# Patient Record
Sex: Female | Born: 1990
Health system: Southern US, Community
[De-identification: ages and names within clinical notes are randomized; demographics above are authoritative.]

## PROBLEM LIST (undated history)

## (undated) ENCOUNTER — Inpatient Hospital Stay (HOSPITAL_COMMUNITY): Payer: Self-pay

## (undated) DIAGNOSIS — D649 Anemia, unspecified: Secondary | ICD-10-CM

## (undated) DIAGNOSIS — O10911 Unspecified pre-existing hypertension complicating pregnancy, first trimester: Secondary | ICD-10-CM

## (undated) DIAGNOSIS — S0590XA Unspecified injury of unspecified eye and orbit, initial encounter: Secondary | ICD-10-CM

## (undated) DIAGNOSIS — R51 Headache: Secondary | ICD-10-CM

## (undated) DIAGNOSIS — E669 Obesity, unspecified: Secondary | ICD-10-CM

## (undated) HISTORY — DX: Anemia, unspecified: D64.9

## (undated) HISTORY — PX: APPENDECTOMY: SHX54

## (undated) HISTORY — PX: OTHER SURGICAL HISTORY: SHX169

---
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
## 2012-02-14 ENCOUNTER — Encounter (HOSPITAL_COMMUNITY): Payer: Self-pay | Admitting: Emergency Medicine

## 2012-02-14 ENCOUNTER — Emergency Department (HOSPITAL_COMMUNITY)
Admission: EM | Admit: 2012-02-14 | Discharge: 2012-02-14 | Disposition: A | Discharge status: Home / Self Care | Payer: Self-pay | Attending: Emergency Medicine | Admitting: Emergency Medicine

## 2012-02-14 DIAGNOSIS — R21 Rash and other nonspecific skin eruption: Secondary | ICD-10-CM | POA: Insufficient documentation

## 2012-02-14 DIAGNOSIS — B360 Pityriasis versicolor: Secondary | ICD-10-CM

## 2012-02-14 MED ORDER — SELENIUM SULFIDE 2.5 % EX LOTN: TOPICAL_LOTION | Freq: Every day | CUTANEOUS | Status: DC | PRN

## 2012-02-14 NOTE — ED Notes (Signed)
PT. REPORTS GENERALIZED ITCHY RASHES FOR SEVERAL WEEKS " IT BURNS " , RESPIRATIONS UNLABORED.

## 2012-02-14 NOTE — ED Provider Notes (Signed)
History     CSN: [id number]  Arrival date & time 02/14/12  7846   First MD Initiated Contact with Patient 02/14/12 2219      Chief Complaint  Patient presents with  . Rash    (Consider location/radiation/quality/duration/timing/severity/associated sxs/prior treatment) Patient is a 21 y.o. female presenting with rash. The history is provided by the patient. No language interpreter was used.  Rash  This is a new problem. The current episode started more than 1 week ago. The problem has not changed since onset.The problem is associated with nothing. There has been no fever. The rash is present on the torso, right upper leg, right lower leg, left upper leg and left lower leg. The pain is at a severity of 0/10. The patient is experiencing no pain. Pertinent negatives include no blisters, no itching, no pain and no weeping. She has tried nothing for the symptoms.  Rash to whole body sparing palms of hands, soles of feet and face. Itchng intermittantly. History reviewed. No pertinent past medical history.  History reviewed. No pertinent past surgical history.  No family history on file.  History  Substance Use Topics  . Smoking status: Never Smoker   . Smokeless tobacco: Not on file  . Alcohol Use: Yes    OB History    Grav Para Term Preterm Abortions TAB SAB Ect Mult Living                  Review of Systems  Constitutional: Negative.  Negative for fever.  HENT: Negative.   Eyes: Negative.   Respiratory: Negative.   Cardiovascular: Negative.   Gastrointestinal: Negative.   Skin: Positive for rash. Negative for itching.  Neurological: Negative.   Psychiatric/Behavioral: Negative.   All other systems reviewed and are negative.    Allergies  Review of patient's allergies indicates no known allergies.  Home Medications   Current Outpatient Rx  Name Route Sig Dispense Refill  . CALCIUM PO Oral Take 1 tablet by mouth 4 (four) times daily.    Marland Kitchen FERROUS SULFATE 325 (65  FE) MG PO TBEC Oral Take 325 mg by mouth daily with breakfast.    . NORETHINDRONE 0.35 MG PO TABS Oral Take 1 tablet by mouth daily.      BP 113/63  Pulse 68  Temp(Src) 98.6 F (37 C) (Oral)  SpO2 99%  LMP 01/29/2012  Physical Exam  Nursing note and vitals reviewed. Constitutional: She is oriented to person, place, and time. She appears well-developed and well-nourished.  HENT:  Head: Normocephalic and atraumatic.  Eyes: Conjunctivae and EOM are normal. Pupils are equal, round, and reactive to light.  Neck: Normal range of motion. Neck supple.  Cardiovascular: Normal rate.   Pulmonary/Chest: Effort normal.  Abdominal: Soft.  Musculoskeletal: Normal range of motion. She exhibits no edema and no tenderness.  Neurological: She is alert and oriented to person, place, and time. She has normal reflexes.  Skin: Skin is warm and dry.       Hypo pigmented macular rash/white all over body sparing face palms of hands and soles of feet.    Psychiatric: She has a normal mood and affect.    ED Course  Procedures (including critical care time)  Labs Reviewed - No data to display No results found.   No diagnosis found.    MDM  Hypopigmented macular rash suspect tinea versicular. Will  treat with selenium sulfide 2.5% x 1 month with dermatology follow up in the next month.  Remi Haggard, NP 02/15/12 [phone]

## 2012-02-14 NOTE — Discharge Instructions (Signed)
Kristine Perez have a fungal infection on your body. He can try this lotion for up to 2 months to see if it helps. You may need to take some and oral medication for up to 2 months to get rid of this. Followup with Dr. Margo Aye the dermatologist after 2 weeks. Return to the ER for high fever nausea and vomiting.  Tinea Versicolor Tinea versicolor is a common yeast infection of the skin. This condition becomes known when the yeast on our skin starts to overgrow (yeast is a normal inhabitant on our skin). This condition is noticed as white or light brown patches on brown skin, and is more evident in the summer on tanned skin. These areas are slightly scaly if scratched. The light patches from the yeast become evident when the yeast creates "holes in your suntan". This is most often noticed in the summer. The patches are usually located on the chest, back, pubis, neck and body folds. However, it may occur on any area of body. Mild itching and inflammation (redness or soreness) may be present. DIAGNOSIS  The diagnosisof this is made clinically (by looking). Cultures from samples are usually not needed. Examination under the microscope may help. However, yeast is normally found on skin. The diagnosis still remains clinical. Examination under Wood's Ultraviolet Light can determine the extent of the infection. TREATMENT  This common infection is usually only of cosmetic (only a concern to your appearance). It is easily treated with dandruff shampoo used during showers or bathing. Vigorous scrubbing will eliminate the yeast over several days time. The light areas in your skin may remain for weeks or months after the infection is cured unless your skin is exposed to sunlight. The lighter or darker spots caused by the fungus that remain after complete treatment are not a sign of treatment failure; it will take a long time to resolve. Your caregiver may recommend a number of commercial preparations or medication by mouth if home  care is not working. Recurrence is common and preventative medication may be necessary. This skin condition is not highly contagious. Special care is not needed to protect close friends and family members. Normal hygiene is usually enough. Follow up is required only if you develop complications (such as a secondary infection from scratching), if recommended by your caregiver, or if no relief is obtained from the preparations used. Document Released: 09/17/2000 Document Revised: 09/09/2011 Document Reviewed: 10/30/2008 [facility] Patient Information 2012 [place], [place].Tinea Versicolor Tinea versicolor is a common yeast infection of the skin. This condition becomes known when the yeast on our skin starts to overgrow (yeast is a normal inhabitant on our skin). This condition is noticed as white or light brown patches on brown skin, and is more evident in the summer on tanned skin. These areas are slightly scaly if scratched. The light patches from the yeast become evident when the yeast creates "holes in your suntan". This is most often noticed in the summer. The patches are usually located on the chest, back, pubis, neck and body folds. However, it may occur on any area of body. Mild itching and inflammation (redness or soreness) may be present. DIAGNOSIS  The diagnosisof this is made clinically (by looking). Cultures from samples are usually not needed. Examination under the microscope may help. However, yeast is normally found on skin. The diagnosis still remains clinical. Examination under Wood's Ultraviolet Light can determine the extent of the infection. TREATMENT  This common infection is usually only of cosmetic (only a concern to  your appearance). It is easily treated with dandruff shampoo used during showers or bathing. Vigorous scrubbing will eliminate the yeast over several days time. The light areas in your skin may remain for weeks or months after the infection is cured unless your skin is exposed  to sunlight. The lighter or darker spots caused by the fungus that remain after complete treatment are not a sign of treatment failure; it will take a long time to resolve. Your caregiver may recommend a number of commercial preparations or medication by mouth if home care is not working. Recurrence is common and preventative medication may be necessary. This skin condition is not highly contagious. Special care is not needed to protect close friends and family members. Normal hygiene is usually enough. Follow up is required only if you develop complications (such as a secondary infection from scratching), if recommended by your caregiver, or if no relief is obtained from the preparations used. Document Released: 09/17/2000 Document Revised: 09/09/2011 Document Reviewed: 10/30/2008 [facility] Patient Information 2012 [place], [place].Fungus Infection of the Skin An infection of your skin caused by a fungus is a very common problem. Treatment depends on which part of the body is affected. Types of fungal skin infection include:  Athlete's Foot(Tinea pedis). This infection starts between the toes and may involve the entire sole and sides of foot. It is the most common fungal disease. It is made worse by heat, moisture, and friction. To treat, wash your feet 2 to 3 times daily. Dry thoroughly between the toes. Use medicated foot powder or cream as directed on the package. Plain talc, cornstarch, or rice powder may be dusted into socks and shoes to keep the feet dry. Wearing footwear that allows ventilation is also helpful.   Ringworm (Tinea corporis and tinea capitis). This infection causes scaly red rings to form on the skin or scalp. For skin sores, apply medicated lotion or cream as directed on the package. For the scalp, medicated shampoo may be used with with other therapies. Ringworm of the scalp or fingernails usually requires using oral medicine for 2 to 4 months.   Tinea versicolor. This infection  appears as painless, scaly, patchy areas of discolored skin (whitish to light brown). It is more common in the summer and favors oily areas of the skin such as those found at the chest, abdomen, back, pubis, neck, and body folds. It can be treated with medicated shampoo or with medicated topical cream. Oral antifungals may be needed for more active infections. The light and/or dark spots may take time to get better and is not a sign of treatment failure.  Fungal infections may need to be treated for several weeks to be cured. It is important not to treat fungal infections with steroids or combination medicine that contains an antifungal and steroid as these will make the fungal infection worse. SEEK MEDICAL CARE IF:   You have persistent itching or rawness.   You have an oral temperature above 102 F (38.9 C).  Document Released: 10/28/2004 Document Revised: 09/09/2011 Document Reviewed: 01/13/2010 [facility] Patient Information 2012 [place], [place].

## 2012-02-15 NOTE — ED Provider Notes (Signed)
Medical screening examination/treatment/procedure(s) were performed by non-physician practitioner and as supervising physician I was immediately available for consultation/collaboration.  Flint Melter, MD 02/15/12 [phone]

## 2012-07-26 ENCOUNTER — Encounter (HOSPITAL_COMMUNITY): Payer: Self-pay | Admitting: Emergency Medicine

## 2012-07-26 ENCOUNTER — Emergency Department (INDEPENDENT_AMBULATORY_CARE_PROVIDER_SITE_OTHER)
Admission: EM | Admit: 2012-07-26 | Discharge: 2012-07-26 | Disposition: A | Discharge status: Home / Self Care | Payer: Self-pay | Source: Home / Self Care | Attending: Emergency Medicine | Admitting: Emergency Medicine

## 2012-07-26 DIAGNOSIS — J040 Acute laryngitis: Secondary | ICD-10-CM

## 2012-07-26 MED ORDER — NAPROXEN 500 MG PO TABS: 500.0000 mg | ORAL_TABLET | Freq: Two times a day (BID) | ORAL | Status: DC

## 2012-07-26 MED ORDER — PREDNISONE 5 MG PO KIT: 1.0000 | PACK | Freq: Every day | ORAL | Status: DC

## 2012-07-26 NOTE — ED Provider Notes (Signed)
Chief Complaint  Patient presents with  . Sore Throat    History of Present Illness:   The patient is a 21 year old female who's had a five-day history of sore throat, hoarseness, temperature of up to 101, headache, earache, soreness in the neck, a mild dry cough, and some nausea. She denies any vomiting, chest pain, wheezing, nasal congestion, or rhinorrhea. No exposure to strep or mono.  Review of Systems:  Other than as noted above, the patient denies any of the following symptoms. Systemic:  No fever, chills, sweats, fatigue, myalgias, headache, or anorexia. Eye:  No redness, pain or drainage. ENT:  No earache, ear congestion, nasal congestion, sneezing, rhinorrhea, sinus pressure, sinus pain, or post nasal drip. Lungs:  No cough, sputum production, wheezing, shortness of breath, or chest pain. GI:  No abdominal pain, nausea, vomiting, or diarrhea. Skin:  No rash or itching.  PMFSH:  Past medical history, family history, social history, meds, allergies, and nurse's notes were reviewed.  There is no known exposure to strep or mono.  No prior history of step or mono.  The patient denies use of tobacco.  Physical Exam:   Vital signs:  BP 125/78  Pulse 68  Temp 99.2 F (37.3 C) (Oral)  Resp 18  SpO2 100%  LMP 06/26/2012 General:  Alert, in no distress. Eye:  No conjunctival injection or drainage. Lids were normal. ENT:  TMs and canals were normal, without erythema or inflammation.  Nasal mucosa was clear and uncongested, without drainage.  Mucous membranes were moist.  Exam of pharynx was completely normal, no erythema, swelling, exudate, or ulcerations.  There were no oral ulcerations or lesions. Neck:  Supple, no adenopathy, tenderness or mass. Lungs:  No respiratory distress.  Lungs were clear to auscultation, without wheezes, rales or rhonchi.  Breath sounds were clear and equal bilaterally.  Heart:  Regular rhythm, without gallops, murmers or rubs. Skin:  Clear, warm, and dry,  without rash or lesions.  Labs:   Results for orders placed during the hospital encounter of 07/26/12  POCT RAPID STREP A ([facility]RG CARE ONLY)      Component Value Range   Streptococcus, Group A Screen (Direct) NEGATIVE  NEGATIVE  POCT PREGNANCY, URINE      Component Value Range   Preg Test, Ur NEGATIVE  NEGATIVE    Assessment:  The encounter diagnosis was Laryngitis.  Plan:   1.  The following meds were prescribed:   New Prescriptions   NAPROXEN (NAPROSYN) 500 MG TABLET    Take 1 tablet (500 mg total) by mouth 2 (two) times daily.   PREDNISONE 5 MG KIT    Take 1 kit (5 mg total) by mouth daily after breakfast. Prednisone 5 mg 6 day dosepack.  Take as directed.   2.  The patient was instructed in symptomatic care including hot saline gargles, throat lozenges, infectious precautions, and need to trade out toothbrush. Handouts were given. 3.  The patient was told to return if becoming worse in any way, if no better in 3 or 4 days, and given some red flag symptoms that would indicate earlier return.    Reuben Likes, MD 07/26/12 [phone]

## 2012-07-26 NOTE — ED Notes (Signed)
Last Friday had onset of symptoms: sore throat, ear pain and head pain.  Reports temp of 101 over the past 2 days

## 2012-07-30 ENCOUNTER — Encounter (HOSPITAL_COMMUNITY): Payer: Self-pay | Admitting: Emergency Medicine

## 2012-07-30 ENCOUNTER — Emergency Department (INDEPENDENT_AMBULATORY_CARE_PROVIDER_SITE_OTHER)
Admission: EM | Admit: 2012-07-30 | Discharge: 2012-07-30 | Disposition: A | Discharge status: Home / Self Care | Payer: Self-pay | Source: Home / Self Care | Attending: Emergency Medicine | Admitting: Emergency Medicine

## 2012-07-30 ENCOUNTER — Telehealth (HOSPITAL_COMMUNITY): Payer: Self-pay | Admitting: Emergency Medicine

## 2012-07-30 DIAGNOSIS — J040 Acute laryngitis: Secondary | ICD-10-CM

## 2012-07-30 NOTE — ED Provider Notes (Signed)
History     CSN: [id number]  Arrival date & time 07/30/12  1451   First MD Initiated Contact with Patient 07/30/12 1513      Chief Complaint  Patient presents with  . Hoarse    pt states that she is still not feeling any better. pt also says that she has not had her prescription filled from first initial visit.    (Consider location/radiation/quality/duration/timing/severity/associated sxs/prior treatment) HPI Comments: Patient presents urgent care after having been told at her job she needed to come in to be rechecked as she continues to be hoarsed in her voice "goes out". Denies any fever he for cough, shortness of breath or difficulty swallowing.  " I was told to come here to be rechecked as what they told me to do my at my job" " no I did not get to medicines were prescribed to me on October 23"  The history is provided by the patient.    History reviewed. No pertinent past medical history.  History reviewed. No pertinent past surgical history.  No family history on file.  History  Substance Use Topics  . Smoking status: Never Smoker   . Smokeless tobacco: Not on file  . Alcohol Use: Yes    OB History    Grav Para Term Preterm Abortions TAB SAB Ect Mult Living                  Review of Systems  Constitutional: Negative for fever and activity change.  HENT: Positive for congestion and voice change. Negative for ear pain, trouble swallowing and tinnitus.   Eyes: Negative for pain, discharge and itching.  Gastrointestinal: Negative for nausea.  Skin: Negative for rash.  Neurological: Negative for dizziness and headaches.    Allergies  Review of patient's allergies indicates no known allergies.  Home Medications   Current Outpatient Rx  Name Route Sig Dispense Refill  . CALCIUM PO Oral Take 1 tablet by mouth 4 (four) times daily.    Marland Kitchen FERROUS SULFATE 325 (65 FE) MG PO TBEC Oral Take 325 mg by mouth daily with breakfast.    . NAPROXEN 500 MG PO TABS Oral  Take 1 tablet (500 mg total) by mouth 2 (two) times daily. 30 tablet 0  . NORETHINDRONE 0.35 MG PO TABS Oral Take 1 tablet by mouth daily.    Marland Kitchen PREDNISONE 5 MG PO KIT Oral Take 1 kit (5 mg total) by mouth daily after breakfast. Prednisone 5 mg 6 day dosepack.  Take as directed. 1 kit 0  . NYQUIL PO Oral Take by mouth.    . DAYQUIL PO Oral Take by mouth.    . SELENIUM SULFIDE 2.5 % EX LOTN Topical Apply topically daily as needed for itching. 118 mL 12    BP 123/70  Pulse 70  Temp 98.1 F (36.7 C) (Oral)  Resp 18  SpO2 100%  LMP 07/26/2012  Physical Exam  Nursing note and vitals reviewed. Constitutional: Vital signs are normal. She appears well-developed and well-nourished.  Non-toxic appearance. She does not have a sickly appearance. She does not appear ill. No distress.  HENT:  Right Ear: Tympanic membrane normal.  Left Ear: Tympanic membrane normal.  Mouth/Throat: Uvula is midline and oropharynx is clear and moist. No oropharyngeal exudate, posterior oropharyngeal edema, posterior oropharyngeal erythema or tonsillar abscesses.  Eyes: Conjunctivae normal are normal.  Neck: Trachea normal. Neck supple. No JVD present. No mass and no thyromegaly present.  Cardiovascular: Normal rate.   No  murmur heard. Pulmonary/Chest: Effort normal and breath sounds normal.  Lymphadenopathy:    She has no cervical adenopathy.  Neurological: She is alert.  Skin: Skin is warm. No erythema.    ED Course  Procedures (including critical care time)  Labs Reviewed - No data to display No results found.   1. Laryngitis       MDM  Laryngitis. Patient nonadherent compliant with treatment as in previous urgent care visit. Have encouraged her to take the prednisone cycle along with an antihistamine over-the-counter such as Zyrtec or Allegra. She acknowledges and decided that this time she will do so. Patient is afebrile with a normal pharyngeal exam.      Jimmie Molly, MD 07/30/12 [phone]

## 2012-07-30 NOTE — ED Notes (Signed)
Patient came in today and spoke with registration staff.  She told them she was unable to speak and was requesting a work note extension.  According to Dr Melanee Spry discharge instructions patient needed to be re-evaluated if she was not better in 3 to 4 days.  Patient made aware

## 2012-07-30 NOTE — ED Notes (Signed)
Pt states no improvement of symptoms.  Pt has not started meds as prescribed.  Was seen on  10/23 loss of  Voice  Needs note for work.

## 2013-01-15 ENCOUNTER — Inpatient Hospital Stay (HOSPITAL_COMMUNITY): Payer: Medicaid Other

## 2013-01-15 ENCOUNTER — Encounter (HOSPITAL_COMMUNITY): Payer: Self-pay | Admitting: *Deleted

## 2013-01-15 ENCOUNTER — Inpatient Hospital Stay (HOSPITAL_COMMUNITY)
Admission: AD | Admit: 2013-01-15 | Discharge: 2013-01-15 | Disposition: A | Discharge status: Home / Self Care | Payer: Medicaid Other | Source: Ambulatory Visit | Attending: Obstetrics & Gynecology | Admitting: Obstetrics & Gynecology

## 2013-01-15 DIAGNOSIS — A499 Bacterial infection, unspecified: Secondary | ICD-10-CM

## 2013-01-15 DIAGNOSIS — B9689 Other specified bacterial agents as the cause of diseases classified elsewhere: Secondary | ICD-10-CM | POA: Insufficient documentation

## 2013-01-15 DIAGNOSIS — O2390 Unspecified genitourinary tract infection in pregnancy, unspecified trimester: Secondary | ICD-10-CM | POA: Insufficient documentation

## 2013-01-15 DIAGNOSIS — O26899 Other specified pregnancy related conditions, unspecified trimester: Secondary | ICD-10-CM

## 2013-01-15 DIAGNOSIS — R109 Unspecified abdominal pain: Secondary | ICD-10-CM | POA: Insufficient documentation

## 2013-01-15 DIAGNOSIS — N760 Acute vaginitis: Secondary | ICD-10-CM | POA: Insufficient documentation

## 2013-01-15 HISTORY — DX: Headache: R51

## 2013-01-15 LAB — CBC
HCT: 31.7 % — ABNORMAL LOW (ref 36.0–46.0)
Hemoglobin: 10.8 g/dL — ABNORMAL LOW (ref 12.0–15.0)
MCH: 26.9 pg (ref 26.0–34.0)
MCV: 78.9 fL (ref 78.0–100.0)
Platelets: 299 10*3/uL (ref 150–400)

## 2013-01-15 LAB — WET PREP, GENITAL

## 2013-01-15 LAB — OB RESULTS CONSOLE GC/CHLAMYDIA: Gonorrhea: NEGATIVE

## 2013-01-15 LAB — URINALYSIS, ROUTINE W REFLEX MICROSCOPIC
Bilirubin Urine: NEGATIVE
Leukocytes, UA: NEGATIVE
Nitrite: NEGATIVE
Specific Gravity, Urine: 1.01 (ref 1.005–1.030)
pH: 6.5 (ref 5.0–8.0)

## 2013-01-15 LAB — POCT PREGNANCY, URINE: Preg Test, Ur: POSITIVE — AB

## 2013-01-15 MED ORDER — METRONIDAZOLE 500 MG PO TABS: 500.0000 mg | ORAL_TABLET | Freq: Two times a day (BID) | ORAL | Status: DC

## 2013-01-15 NOTE — MAU Note (Signed)
Name and DOB verified, pt confirms spelling is correct on armband 

## 2013-01-15 NOTE — MAU Note (Addendum)
Been having abd pain. Missed period this month.  Did a whole bunch of tests, they were positive, but she still can't believe it.  Has been nauseated, stomach gets in knots when eats.

## 2013-01-15 NOTE — MAU Provider Note (Signed)
IUGS on [place].  Attestation of Attending Supervision of Advanced Practitioner (CNM/NP): Evaluation and management procedures were performed by the Advanced Practitioner under my supervision and collaboration. I have reviewed the Advanced Practitioner's note and chart, and I agree with the management and plan.  LEGGETT,KELLY H. 5:13 PM

## 2013-01-15 NOTE — MAU Provider Note (Signed)
History     CSN: [id number]  Arrival date and time: 01/15/13 1328   None     Chief Complaint  Patient presents with  . Abdominal Pain  . Possible Pregnancy   HPI 22 y.o. G1P0 at [redacted]w[redacted]d with low abd cramping for a few weeks. No bleeding or discharge. Plans on prenatal care with [facility].   Past Medical History  Diagnosis Date  . Headache     migraines    Past Surgical History  Procedure Laterality Date  . Abscess      under arm    Family History  Problem Relation Age of Onset  . Hypertension Mother   . Diabetes Maternal Grandmother   . Heart disease Maternal Grandmother   . Diabetes Maternal Grandfather   . Cancer Maternal Grandfather     History  Substance Use Topics  . Smoking status: Never Smoker   . Smokeless tobacco: Not on file  . Alcohol Use: Yes     Comment: weekends until + UPT    Allergies: No Known Allergies  Prescriptions prior to admission  Medication Sig Dispense Refill  . [DISCONTINUED] CALCIUM PO Take 1 tablet by mouth 4 (four) times daily.      . [DISCONTINUED] ferrous sulfate 325 (65 FE) MG EC tablet Take 325 mg by mouth daily with breakfast.      . [DISCONTINUED] naproxen (NAPROSYN) 500 MG tablet Take 1 tablet (500 mg total) by mouth 2 (two) times daily.  30 tablet  0  . [DISCONTINUED] norethindrone (MICRONOR,CAMILA,ERRIN) 0.35 MG tablet Take 1 tablet by mouth daily.      . [DISCONTINUED] PredniSONE 5 MG KIT Take 1 kit (5 mg total) by mouth daily after breakfast. Prednisone 5 mg 6 day dosepack.  Take as directed.  1 kit  0  . [DISCONTINUED] Pseudoeph-Doxylamine-DM-APAP (NYQUIL PO) Take by mouth.      . [DISCONTINUED] Pseudoephedrine-APAP-DM (DAYQUIL PO) Take by mouth.      . [DISCONTINUED] selenium sulfide (SELSUN) 2.5 % shampoo Apply topically daily as needed for itching.  118 mL  12    Review of Systems  Constitutional: Negative.   Respiratory: Negative.   Cardiovascular: Negative.   Gastrointestinal: Positive for abdominal pain.  Negative for nausea, vomiting, diarrhea and constipation.  Genitourinary: Negative for dysuria, urgency, frequency, hematuria and flank pain.       Negative for vaginal bleeding, vaginal discharge, dyspareunia  Musculoskeletal: Negative.   Neurological: Negative.   Psychiatric/Behavioral: Negative.    Physical Exam   Blood pressure 129/69, pulse 98, temperature 98.1 F (36.7 C), temperature source Oral, resp. rate 18, height 5\' 6"  (1.676 m), weight 213 lb (96.616 kg), last menstrual period 12/13/2012.  Physical Exam  Nursing note and vitals reviewed. Constitutional: She is oriented to person, place, and time. She appears well-developed and well-nourished. No distress.  Cardiovascular: Normal rate.   Respiratory: Effort normal.  GI: Soft. There is no tenderness.  Genitourinary: There is no tenderness, lesion or injury on the right labia. There is no tenderness, lesion or injury on the left labia. Uterus is not enlarged and not tender. Cervix exhibits no motion tenderness and no friability. Right adnexum displays no mass, no tenderness and no fullness. Left adnexum displays no mass, no tenderness and no fullness. No bleeding around the vagina. Vaginal discharge (white, malodorous) found.  Musculoskeletal: Normal range of motion.  Neurological: She is alert and oriented to person, place, and time.  Skin: Skin is warm and dry.  Psychiatric: She has a  normal mood and affect.    MAU Course  Procedures Results for orders placed during the hospital encounter of 01/15/13 (from the past 24 hour(s))  URINALYSIS, ROUTINE W REFLEX MICROSCOPIC     Status: None   Collection Time    01/15/13  1:55 PM      Result Value Range   Color, Urine YELLOW  YELLOW   APPearance CLEAR  CLEAR   Specific Gravity, Urine 1.010  1.005 - 1.030   pH 6.5  5.0 - 8.0   Glucose, UA NEGATIVE  NEGATIVE mg/dL   Hgb urine dipstick NEGATIVE  NEGATIVE   Bilirubin Urine NEGATIVE  NEGATIVE   Ketones, ur NEGATIVE  NEGATIVE  mg/dL   Protein, ur NEGATIVE  NEGATIVE mg/dL   Urobilinogen, UA 0.2  0.0 - 1.0 mg/dL   Nitrite NEGATIVE  NEGATIVE   Leukocytes, UA NEGATIVE  NEGATIVE  POCT PREGNANCY, URINE     Status: Abnormal   Collection Time    01/15/13  1:58 PM      Result Value Range   Preg Test, Ur POSITIVE (*) NEGATIVE  HCG, QUANTITATIVE, PREGNANCY     Status: Abnormal   Collection Time    01/15/13  2:07 PM      Result Value Range   hCG, Beta Chain, Quant, S 4466 (*) <5 mIU/mL  CBC     Status: Abnormal   Collection Time    01/15/13  2:08 PM      Result Value Range   WBC 8.4  4.0 - 10.5 K/uL   RBC 4.02  3.87 - 5.11 MIL/uL   Hemoglobin 10.8 (*) 12.0 - 15.0 g/dL   HCT 40.9 (*) 81.1 - 91.4 %   MCV 78.9  78.0 - 100.0 fL   MCH 26.9  26.0 - 34.0 pg   MCHC 34.1  30.0 - 36.0 g/dL   RDW 78.2  95.6 - 21.3 %   Platelets 299  150 - 400 K/uL  WET PREP, GENITAL     Status: Abnormal   Collection Time    01/15/13  3:10 PM      Result Value Range   Yeast Wet Prep HPF POC NONE SEEN  NONE SEEN   Trich, Wet Prep NONE SEEN  NONE SEEN   Clue Cells Wet Prep HPF POC FEW (*) NONE SEEN   WBC, Wet Prep HPF POC MODERATE (*) NONE SEEN   U/S: 5 week size IUGS, no yolk sac, fetal pole or adnexal mass seen  Assessment and Plan   1. BV (bacterial vaginosis)   2. Abdominal pain complicating pregnancy   Rx flagyl, precautions rev'd, return to MAU in 48 hours for repeat quant    Medication List    STOP taking these medications       CALCIUM PO     DAYQUIL PO     ferrous sulfate 325 (65 FE) MG EC tablet     naproxen 500 MG tablet  Commonly known as:  NAPROSYN     norethindrone 0.35 MG tablet  Commonly known as:  MICRONOR,CAMILA,ERRIN     NYQUIL PO     PredniSONE 5 MG Kit     selenium sulfide 2.5 % shampoo  Commonly known as:  SELSUN      TAKE these medications       metroNIDAZOLE 500 MG tablet  Commonly known as:  FLAGYL  Take 1 tablet (500 mg total) by mouth 2 (two) times daily.  Follow-up Information   Follow up with THE [facility] [place] MATERNITY ADMISSIONS On 01/18/2013. (for labs)    Contact information:   [address] [id number] [place] [address] [phone]        [facility] 01/15/2013, 3:39 PM

## 2013-01-16 LAB — GC/CHLAMYDIA PROBE AMP: GC Probe RNA: NEGATIVE

## 2013-01-17 ENCOUNTER — Inpatient Hospital Stay (HOSPITAL_COMMUNITY)
Admission: AD | Admit: 2013-01-17 | Discharge: 2013-01-17 | Disposition: A | Discharge status: Home / Self Care | Payer: Medicaid Other | Source: Ambulatory Visit | Attending: Obstetrics and Gynecology | Admitting: Obstetrics and Gynecology

## 2013-01-17 DIAGNOSIS — R109 Unspecified abdominal pain: Secondary | ICD-10-CM

## 2013-01-17 DIAGNOSIS — O26899 Other specified pregnancy related conditions, unspecified trimester: Secondary | ICD-10-CM

## 2013-01-17 DIAGNOSIS — O9989 Other specified diseases and conditions complicating pregnancy, childbirth and the puerperium: Secondary | ICD-10-CM

## 2013-01-17 DIAGNOSIS — O99891 Other specified diseases and conditions complicating pregnancy: Secondary | ICD-10-CM | POA: Insufficient documentation

## 2013-01-17 NOTE — MAU Note (Signed)
Patient to MAU for repeat BHCG. Patient denies any pain or bleeding.

## 2013-01-17 NOTE — MAU Provider Note (Signed)
Ms. Kristine Perez is a 22 y.o. G1P0 at [redacted]w[redacted]d who presents to MAU for a 48 repeat quant hCG. The patient denies bleeding, abdominal pain, N/V or fever today  BP 127/64  Pulse 72  Temp(Src) 98.3 F (36.8 C) (Oral)  Resp 16  SpO2 100%  LMP 12/13/2012  GENERAL: Well-developed, well-nourished female in no acute distress.  HEENT: Normocephalic, atraumatic.   LUNGS: Effort normal HEART: Regular rate  SKIN: Warm, dry and without erythema PSYCH: Normal mood and affect  Results for orders placed during the hospital encounter of 01/17/13 (from the past 24 hour(s))  HCG, QUANTITATIVE, PREGNANCY     Status: Abnormal   Collection Time    01/17/13 11:41 AM      Result Value Range   hCG, Beta Chain, Quant, S 7977 (*) <5 mIU/mL   MDM Discussed with Dr. Jolayne Panther. Give ectopic precautions and follow-up for [place] in one week from last [place].   A: Inappropriate rise in quant after 48 hours  P: Discharge home Ectopic precautions discussed [place] scheduled for 01/22/13 @ 11:30 am Patient may return to MAU as needed or sooner if her condition were to change or worsen  Freddi Starr, PA-C 01/17/2013 1:27 PM

## 2013-01-17 NOTE — MAU Provider Note (Signed)
Intrauterine gestational sac previously visualized on ultrasound, no adnexal mass  Attestation of Attending Supervision of Advanced Practitioner (CNM/NP): Evaluation and management procedures were performed by the Advanced Practitioner under my supervision and collaboration.  I have reviewed the Advanced Practitioner's note and chart, and I agree with the management and plan.  Travian Kerner 01/17/2013 4:15 PM

## 2013-01-22 ENCOUNTER — Encounter (HOSPITAL_COMMUNITY): Payer: Self-pay

## 2013-01-22 ENCOUNTER — Inpatient Hospital Stay (HOSPITAL_COMMUNITY)
Admission: AD | Admit: 2013-01-22 | Discharge: 2013-01-22 | Disposition: A | Discharge status: Home / Self Care | Payer: Self-pay | Source: Ambulatory Visit | Attending: Obstetrics and Gynecology | Admitting: Obstetrics and Gynecology

## 2013-01-22 ENCOUNTER — Ambulatory Visit (HOSPITAL_COMMUNITY)
Admission: RE | Admit: 2013-01-22 | Discharge: 2013-01-22 | Disposition: A | Discharge status: Home / Self Care | Payer: Medicaid Other | Source: Ambulatory Visit | Attending: Medical | Admitting: Medical

## 2013-01-22 DIAGNOSIS — O99891 Other specified diseases and conditions complicating pregnancy: Secondary | ICD-10-CM | POA: Insufficient documentation

## 2013-01-22 DIAGNOSIS — Z1389 Encounter for screening for other disorder: Secondary | ICD-10-CM

## 2013-01-22 DIAGNOSIS — Z3490 Encounter for supervision of normal pregnancy, unspecified, unspecified trimester: Secondary | ICD-10-CM

## 2013-01-22 NOTE — MAU Provider Note (Signed)
Attestation of Attending Supervision of Advanced Practitioner (CNM/NP): Evaluation and management procedures were performed by the Advanced Practitioner under my supervision and collaboration.  I have reviewed the Advanced Practitioner's note and chart, and I agree with the management and plan.  Latayvia Mandujano 01/22/2013 7:31 PM   

## 2013-01-22 NOTE — MAU Note (Signed)
Pt here today for f/u u/s for viability. Denies pain or bleeding.

## 2013-01-22 NOTE — MAU Provider Note (Signed)
22 y.o. G1P0 at [redacted]w[redacted]d here for repeat u/s for viability. No pain or bleeding today. Unsure where she will get her prenatal care, but has applied for her medicaid.   BP 117/59  Pulse 78  Temp(Src) 97.9 F (36.6 C) (Oral)  Resp 16  Ht 5\' 6"  (1.676 m)  Wt 215 lb 4 oz (97.637 kg)  BMI 34.76 kg/m2  LMP 12/13/2012  Gen: well, no distress  U/S: 5.6 week IUP, + FHR, small subchorionic hemorrhage  A/P: 22 y.o. G1P0 at [redacted]w[redacted]d with living IUP Follow up for prenatal care as soon as possible, list of providers given, precautions rev'd

## 2013-01-27 ENCOUNTER — Inpatient Hospital Stay (HOSPITAL_COMMUNITY)
Admission: AD | Admit: 2013-01-27 | Discharge: 2013-01-27 | Disposition: A | Discharge status: Home / Self Care | Payer: Medicaid Other | Source: Ambulatory Visit | Attending: Obstetrics & Gynecology | Admitting: Obstetrics & Gynecology

## 2013-01-27 ENCOUNTER — Encounter (HOSPITAL_COMMUNITY): Payer: Self-pay

## 2013-01-27 DIAGNOSIS — O210 Mild hyperemesis gravidarum: Secondary | ICD-10-CM | POA: Insufficient documentation

## 2013-01-27 DIAGNOSIS — R1032 Left lower quadrant pain: Secondary | ICD-10-CM | POA: Insufficient documentation

## 2013-01-27 DIAGNOSIS — O219 Vomiting of pregnancy, unspecified: Secondary | ICD-10-CM

## 2013-01-27 DIAGNOSIS — R51 Headache: Secondary | ICD-10-CM | POA: Insufficient documentation

## 2013-01-27 LAB — URINALYSIS, ROUTINE W REFLEX MICROSCOPIC
Bilirubin Urine: NEGATIVE
Glucose, UA: NEGATIVE mg/dL
Hgb urine dipstick: NEGATIVE
Ketones, ur: NEGATIVE mg/dL
Leukocytes, UA: NEGATIVE
Nitrite: NEGATIVE
Protein, ur: NEGATIVE mg/dL
Specific Gravity, Urine: 1.025 (ref 1.005–1.030)
Urobilinogen, UA: 1 mg/dL (ref 0.0–1.0)
pH: 6.5 (ref 5.0–8.0)

## 2013-01-27 MED ORDER — PROMETHAZINE HCL 25 MG/ML IJ SOLN
25.0000 mg | Freq: Once | INTRAVENOUS | Status: AC
  Administered 2013-01-27: 25 mg via INTRAVENOUS
  Filled 2013-01-27: qty 1

## 2013-01-27 MED ORDER — ONDANSETRON HCL 8 MG PO TABS: 8.0000 mg | ORAL_TABLET | Freq: Three times a day (TID) | ORAL | Status: DC | PRN

## 2013-01-27 MED ORDER — ACETAMINOPHEN 325 MG PO TABS
650.0000 mg | ORAL_TABLET | Freq: Once | ORAL | Status: AC
  Administered 2013-01-27: 650 mg via ORAL
  Filled 2013-01-27: qty 2

## 2013-01-27 MED ORDER — PROMETHAZINE HCL 12.5 MG PO TABS: 12.5000 mg | ORAL_TABLET | Freq: Four times a day (QID) | ORAL | Status: DC | PRN

## 2013-01-27 NOTE — MAU Provider Note (Signed)
History     CSN: [id number]  Arrival date & time 01/27/13  1820   None     Chief Complaint  Patient presents with  . Emesis    (Consider location/radiation/quality/duration/timing/severity/associated sxs/prior treatment) HPI Kristine Perez is a 22 y.o. G1P0000 at [redacted]w[redacted]d. She presents with c/o vomiting x 2 d. She has had a headache that started yesterday, feels dizzy. She has sharp pain LLQ off/on, with movement.  She had U/S 4/21- 5 6/7 wk IUP,FHR 128, sm SCH.   Past Medical History  Diagnosis Date  . Headache     migraines    Past Surgical History  Procedure Laterality Date  . Abscess      under arm  . No past surgeries      Family History  Problem Relation Age of Onset  . Hypertension Mother   . Diabetes Maternal Grandmother   . Heart disease Maternal Grandmother   . Diabetes Maternal Grandfather   . Cancer Maternal Grandfather     History  Substance Use Topics  . Smoking status: Never Smoker   . Smokeless tobacco: Not on file  . Alcohol Use: No     Comment: weekends until + UPT    OB History   Grav Para Term Preterm Abortions TAB SAB Ect Mult Living   1 0 0 0 0 0 0 0 0 0       Review of Systems  Constitutional: Negative for fever and chills.  Gastrointestinal: Positive for vomiting. Negative for diarrhea and constipation.  Genitourinary: Positive for pelvic pain. Negative for dysuria, urgency, frequency, vaginal bleeding and vaginal discharge.  Neurological: Positive for dizziness and headaches.    Allergies  Review of patient's allergies indicates no known allergies.  Home Medications  No current outpatient prescriptions on file.  BP 134/59  Pulse 83  Resp 18  Ht 5\' 6"  (1.676 m)  Wt 213 lb 9.6 oz (96.888 kg)  BMI 34.49 kg/m2  LMP 12/13/2012  Physical Exam  Constitutional: She appears well-developed and well-nourished.  Abdominal: Soft. She exhibits no mass. There is no tenderness. There is no rebound and no guarding.  Skin: Skin is  warm and dry.  Psychiatric: She has a normal mood and affect. Her behavior is normal.    ED Course  Procedures (including critical care time)  Labs Reviewed  URINALYSIS, ROUTINE W REFLEX MICROSCOPIC   No results found. Care to [facility] @ 2100  No diagnosis found.    MDM

## 2013-01-27 NOTE — MAU Note (Signed)
Pt reports since yesterday she has not been able to keep food or fluids down. Feels real dzzy and c/o sharp pain in her LLQ.

## 2013-01-27 NOTE — Progress Notes (Signed)
Filed Vitals:   01/27/13 2034  BP:   Pulse:   Temp: 98.7 F (37.1 C)  Resp:     Report received from Eveline Keto; pt reassessed at 2225 after IV infused;  pt reports improvement in symptoms and desires discharge home.  Plans to begin prenatal care at [facility].  A:  Vomiting in Pregnancy  P: DC to home RX Zofran Keep prenatal care appt [facility]

## 2013-02-07 ENCOUNTER — Encounter (HOSPITAL_COMMUNITY): Payer: Self-pay | Admitting: *Deleted

## 2013-02-07 ENCOUNTER — Inpatient Hospital Stay (HOSPITAL_COMMUNITY)
Admission: AD | Admit: 2013-02-07 | Discharge: 2013-02-08 | Disposition: A | Discharge status: Home / Self Care | Payer: Medicaid Other | Source: Ambulatory Visit | Attending: Obstetrics & Gynecology | Admitting: Obstetrics & Gynecology

## 2013-02-07 DIAGNOSIS — O219 Vomiting of pregnancy, unspecified: Secondary | ICD-10-CM

## 2013-02-07 DIAGNOSIS — R109 Unspecified abdominal pain: Secondary | ICD-10-CM | POA: Insufficient documentation

## 2013-02-07 DIAGNOSIS — O210 Mild hyperemesis gravidarum: Secondary | ICD-10-CM

## 2013-02-07 LAB — URINALYSIS, ROUTINE W REFLEX MICROSCOPIC
Ketones, ur: >80 mg/dL — AB
Nitrite: NEGATIVE
Protein, ur: 30 mg/dL — AB

## 2013-02-07 LAB — URINE MICROSCOPIC-ADD ON

## 2013-02-07 MED ORDER — PROMETHAZINE HCL 25 MG/ML IJ SOLN
25.0000 mg | Freq: Once | INTRAVENOUS | Status: AC
  Administered 2013-02-07: 25 mg via INTRAVENOUS
  Filled 2013-02-07: qty 1

## 2013-02-07 MED ORDER — PROMETHAZINE HCL 25 MG PO TABS: 25.0000 mg | ORAL_TABLET | Freq: Four times a day (QID) | ORAL | Status: DC | PRN

## 2013-02-07 MED ORDER — M.V.I. ADULT IV INJ
Freq: Once | INTRAVENOUS | Status: AC
  Administered 2013-02-07: 21:00:00 via INTRAVENOUS
  Filled 2013-02-07: qty 1000

## 2013-02-07 NOTE — MAU Note (Signed)
Pt arrived by EMS for N/V x 7 days but pt is unable to keep anything down for over 24 hours now.  Pt unable to keep zofran, flagyl, food or water.  Denies vaginal bleeding.

## 2013-02-07 NOTE — MAU Provider Note (Addendum)
History     CSN: [id number]  Arrival date and time: 02/07/13 1717   First Provider Initiated Contact with Patient 02/07/13 1737      Chief Complaint  Patient presents with  . Emesis During Pregnancy   HPI Ms. Kristine Perez is a 22 y.o. G1P0000 at [redacted]w[redacted]d who presents to MAU today with complaint of N/V. The patient states that this has been consistent throughout her pregnancy, however much worse the last week. She states no intake x 24 hours. She has phenergan and zofran from a previous visit, but has not been able to keep them down. The patient is having mild upper abdominal pain. She denies fever or diarrhea. She plans to go to [facility] for prenatal care, but has not had a prenatal appointment with them yet.    OB History   Grav Para Term Preterm Abortions TAB SAB Ect Mult Living   1 0 0 0 0 0 0 0 0 0       Past Medical History  Diagnosis Date  . Headache     migraines    Past Surgical History  Procedure Laterality Date  . Abscess      under arm  . No past surgeries      Family History  Problem Relation Age of Onset  . Hypertension Mother   . Diabetes Maternal Grandmother   . Heart disease Maternal Grandmother   . Diabetes Maternal Grandfather   . Cancer Maternal Grandfather     History  Substance Use Topics  . Smoking status: Never Smoker   . Smokeless tobacco: Not on file  . Alcohol Use: No     Comment: weekends until + UPT    Allergies: No Known Allergies  Prescriptions prior to admission  Medication Sig Dispense Refill  . flintstones complete (FLINTSTONES) 60 MG chewable tablet Chew 2 tablets by mouth daily.      . metroNIDAZOLE (FLAGYL) 250 MG tablet Take 250 mg by mouth 3 (three) times daily.      . ondansetron (ZOFRAN) 8 MG tablet Take 1 tablet (8 mg total) by mouth every 8 (eight) hours as needed for nausea.  30 tablet  1  . promethazine (PHENERGAN) 12.5 MG tablet Take 1 tablet (12.5 mg total) by mouth every 6 (six) hours as needed for nausea.  30  tablet  0    Review of Systems  Constitutional: Positive for malaise/fatigue. Negative for fever.  Gastrointestinal: Positive for nausea, vomiting and abdominal pain. Negative for diarrhea and constipation.  Genitourinary: Negative for dysuria, urgency and frequency.       Neg - vaginal bleeding, discharge  Neurological: Positive for dizziness and weakness. Negative for loss of consciousness.   Physical Exam   Blood pressure 130/66, pulse 69, temperature 98.8 F (37.1 C), temperature source Oral, resp. rate 16, height 5\' 6"  (1.676 m), weight 213 lb (96.616 kg), last menstrual period 12/13/2012, SpO2 100.00%.  Physical Exam  Constitutional: She is oriented to person, place, and time. She appears well-developed and well-nourished. No distress.  HENT:  Head: Normocephalic and atraumatic.  Cardiovascular: Normal rate, regular rhythm and normal heart sounds.   Respiratory: Effort normal and breath sounds normal. No respiratory distress.  GI: Soft. Bowel sounds are normal. She exhibits no distension and no mass. There is no tenderness. There is no rebound and no guarding.  Neurological: She is alert and oriented to person, place, and time.  Skin: Skin is warm and dry. No erythema.  Psychiatric: She has a normal  mood and affect.   Results for orders placed during the hospital encounter of 02/07/13 (from the past 24 hour(s))  URINALYSIS, ROUTINE W REFLEX MICROSCOPIC     Status: Abnormal   Collection Time    02/07/13  5:45 PM      Result Value Range   Color, Urine YELLOW  YELLOW   APPearance HAZY (*) CLEAR   Specific Gravity, Urine >1.030 (*) 1.005 - 1.030   pH 6.5  5.0 - 8.0   Glucose, UA NEGATIVE  NEGATIVE mg/dL   Hgb urine dipstick NEGATIVE  NEGATIVE   Bilirubin Urine NEGATIVE  NEGATIVE   Ketones, ur >80 (*) NEGATIVE mg/dL   Protein, ur 30 (*) NEGATIVE mg/dL   Urobilinogen, UA 1.0  0.0 - 1.0 mg/dL   Nitrite NEGATIVE  NEGATIVE   Leukocytes, UA TRACE (*) NEGATIVE  URINE  MICROSCOPIC-ADD ON     Status: Abnormal   Collection Time    02/07/13  5:45 PM      Result Value Range   Squamous Epithelial / LPF FEW (*) RARE   WBC, UA 3-6  <3 WBC/hpf   Bacteria, UA FEW (*) RARE   Urine-Other MUCOUS PRESENT      MAU Course  Procedures None  MDM UA show signs of significant dehydration IV LR with phenergan infusion 1940 - Patient receiving IV fluids. Care turned over to Nolene Bernheim, NP  Assessment and Plan    Freddi Starr, PA-C  02/07/2013, 7:10 PM   Dorathy Kinsman, CNM assumed care of pt at 2000.  2330: Pt tolerating crackers and fluids.   Assessment: 1. Nausea and vomiting in pregnancy prior to [redacted] weeks gestation    Plan: D/C home in stable condition. Advance diet slowly.  Follow-up Information   Follow up with [facility] On 03/19/2013. (as scheduled or as needed if symptoms worsen)    Contact information:   [address] [place] [address] [phone]      Follow up with THE [facility] [place] MATERNITY ADMISSIONS. (As needed if symptoms worsen)    Contact information:   [address] [place] [address] [phone]       Medication List    TAKE these medications       flintstones complete 60 MG chewable tablet  Chew 2 tablets by mouth daily.     metroNIDAZOLE 250 MG tablet  Commonly known as:  FLAGYL  Take 250 mg by mouth 3 (three) times daily.     ondansetron 8 MG tablet  Commonly known as:  ZOFRAN  Take 1 tablet (8 mg total) by mouth every 8 (eight) hours as needed for nausea.     promethazine 25 MG tablet  Commonly known as:  PHENERGAN  Take 1 tablet (25 mg total) by mouth every 6 (six) hours as needed for nausea.       [place], CNM 02/08/2013 12:21 AM

## 2013-02-08 LAB — URINE CULTURE: Colony Count: >=100000

## 2013-02-08 NOTE — MAU Note (Signed)
DC instructions given to pt. Pt verbalized understanding.

## 2013-03-01 ENCOUNTER — Inpatient Hospital Stay (HOSPITAL_COMMUNITY)
Admission: AD | Admit: 2013-03-01 | Discharge: 2013-03-02 | Disposition: A | Discharge status: Home / Self Care | Payer: Medicaid Other | Source: Ambulatory Visit | Attending: Obstetrics | Admitting: Obstetrics

## 2013-03-01 ENCOUNTER — Encounter (HOSPITAL_COMMUNITY): Payer: Self-pay | Admitting: Family

## 2013-03-01 ENCOUNTER — Ambulatory Visit (INDEPENDENT_AMBULATORY_CARE_PROVIDER_SITE_OTHER): Payer: Medicaid Other | Admitting: Obstetrics

## 2013-03-01 DIAGNOSIS — O2390 Unspecified genitourinary tract infection in pregnancy, unspecified trimester: Secondary | ICD-10-CM | POA: Insufficient documentation

## 2013-03-01 DIAGNOSIS — N390 Urinary tract infection, site not specified: Secondary | ICD-10-CM | POA: Insufficient documentation

## 2013-03-01 DIAGNOSIS — Z3400 Encounter for supervision of normal first pregnancy, unspecified trimester: Secondary | ICD-10-CM

## 2013-03-01 DIAGNOSIS — O210 Mild hyperemesis gravidarum: Secondary | ICD-10-CM | POA: Insufficient documentation

## 2013-03-01 LAB — COMPREHENSIVE METABOLIC PANEL
ALT: 12 U/L (ref 0–35)
AST: 14 U/L (ref 0–37)
Albumin: 3.9 g/dL (ref 3.5–5.2)
Alkaline Phosphatase: 37 U/L — ABNORMAL LOW (ref 39–117)
BUN: 8 mg/dL (ref 6–23)
Chloride: 97 mEq/L (ref 96–112)
Potassium: 3.1 mEq/L — ABNORMAL LOW (ref 3.5–5.1)
Sodium: 135 mEq/L (ref 135–145)
Total Bilirubin: 0.3 mg/dL (ref 0.3–1.2)
Total Protein: 7.8 g/dL (ref 6.0–8.3)

## 2013-03-01 LAB — CBC
HCT: 31.7 % — ABNORMAL LOW (ref 36.0–46.0)
MCHC: 35 g/dL (ref 30.0–36.0)
Platelets: 279 10*3/uL (ref 150–400)
RDW: 13.8 % (ref 11.5–15.5)
WBC: 7.1 10*3/uL (ref 4.0–10.5)

## 2013-03-01 LAB — URINALYSIS, ROUTINE W REFLEX MICROSCOPIC
Glucose, UA: NEGATIVE mg/dL
Ketones, ur: >80 mg/dL — AB
Protein, ur: 100 mg/dL — AB

## 2013-03-01 LAB — URINE MICROSCOPIC-ADD ON

## 2013-03-01 MED ORDER — DEXTROSE IN LACTATED RINGERS 5 % IV SOLN
Freq: Once | INTRAVENOUS | Status: AC
  Administered 2013-03-01: 20:00:00 via INTRAVENOUS

## 2013-03-01 MED ORDER — POTASSIUM CL IN DEXTROSE 5% 20 MEQ/L IV SOLN: 20.0000 meq | Freq: Once | INTRAVENOUS | Status: DC

## 2013-03-01 MED ORDER — POTASSIUM CHLORIDE 2 MEQ/ML IV SOLN
Freq: Once | INTRAVENOUS | Status: AC
  Administered 2013-03-01: 21:00:00 via INTRAVENOUS
  Filled 2013-03-01: qty 1000

## 2013-03-01 MED ORDER — POTASSIUM CL IN DEXTROSE 5% 20 MEQ/L IV SOLN
20.0000 meq | Freq: Once | INTRAVENOUS | Status: DC
  Filled 2013-03-01: qty 1000

## 2013-03-01 MED ORDER — ONDANSETRON HCL 4 MG/2ML IJ SOLN
4.0000 mg | Freq: Once | INTRAMUSCULAR | Status: AC
  Administered 2013-03-01: 4 mg via INTRAVENOUS
  Filled 2013-03-01: qty 2

## 2013-03-01 MED ORDER — PROMETHAZINE HCL 25 MG/ML IJ SOLN
12.5000 mg | Freq: Once | INTRAMUSCULAR | Status: AC
  Administered 2013-03-01: 12.5 mg via INTRAVENOUS
  Filled 2013-03-01: qty 1

## 2013-03-01 NOTE — MAU Note (Addendum)
Pt states taking phenergan and zofran however are not effective. Denies abnormal vaginal d/c changes or bleeding. Constantly spits. Nothing stays down.

## 2013-03-01 NOTE — MAU Provider Note (Signed)
History     CSN: [id number]  Arrival date and time: 03/01/13 1454   First Provider Initiated Contact with Patient 03/01/13 1747      Chief Complaint  Patient presents with  . Emesis During Pregnancy   HPI  Pt is a G1P0 at 11.2 wks IUP here with nausea and vomiting that has worsened over the past 3 days.  No report of vaginal bleeding.  Does have white discharge in underwear, without itching or odor.  Reports phenergan and zofran is no longer working.  Past Medical History  Diagnosis Date  . Headache(784.0)     migraines    Past Surgical History  Procedure Laterality Date  . Abscess      under arm  . No past surgeries      Family History  Problem Relation Age of Onset  . Hypertension Mother   . Diabetes Maternal Grandmother   . Heart disease Maternal Grandmother   . Diabetes Maternal Grandfather   . Cancer Maternal Grandfather     History  Substance Use Topics  . Smoking status: Never Smoker   . Smokeless tobacco: Not on file  . Alcohol Use: No     Comment: weekends until + UPT    Allergies: No Known Allergies  Prescriptions prior to admission  Medication Sig Dispense Refill  . flintstones complete (FLINTSTONES) 60 MG chewable tablet Chew 2 tablets by mouth daily.      . ondansetron (ZOFRAN) 8 MG tablet Take 1 tablet (8 mg total) by mouth every 8 (eight) hours as needed for nausea.  30 tablet  1  . promethazine (PHENERGAN) 25 MG tablet Take 1 tablet (25 mg total) by mouth every 6 (six) hours as needed for nausea.  30 tablet  1    Review of Systems  Constitutional: Positive for malaise/fatigue.  Gastrointestinal: Positive for nausea, vomiting and abdominal pain (lower abdominal pain with vomiting).  Neurological: Positive for dizziness and weakness.  All other systems reviewed and are negative.   Physical Exam   Blood pressure 119/67, pulse 93, temperature 98.8 F (37.1 C), temperature source Oral, resp. rate 16, height 5' 6.5" (1.689 m), weight 89.631  kg (197 lb 9.6 oz), last menstrual period 12/13/2012, SpO2 100.00%.  Physical Exam  Constitutional: She is oriented to person, place, and time. She appears well-developed and well-nourished.  HENT:  Head: Normocephalic.  Mouth/Throat: Mucous membranes are dry.  Neck: Normal range of motion. Neck supple.  Cardiovascular: Normal rate, regular rhythm and normal heart sounds.   Respiratory: Effort normal and breath sounds normal.  GI: Soft. There is no tenderness.  Genitourinary: No bleeding around the vagina. No vaginal discharge found.  Neurological: She is alert and oriented to person, place, and time. She has normal reflexes.  Skin: Skin is warm and dry. She is not diaphoretic.    MAU Course  Procedures  Results for orders placed during the hospital encounter of 03/01/13 (from the past 24 hour(s))  URINALYSIS, ROUTINE W REFLEX MICROSCOPIC     Status: Abnormal   Collection Time    03/01/13  3:30 PM      Result Value Range   Color, Urine YELLOW  YELLOW   APPearance HAZY (*) CLEAR   Specific Gravity, Urine >1.030 (*) 1.005 - 1.030   pH 6.0  5.0 - 8.0   Glucose, UA NEGATIVE  NEGATIVE mg/dL   Hgb urine dipstick NEGATIVE  NEGATIVE   Bilirubin Urine SMALL (*) NEGATIVE   Ketones, ur >80 (*) NEGATIVE mg/dL  Protein, ur 100 (*) NEGATIVE mg/dL   Urobilinogen, UA 0.2  0.0 - 1.0 mg/dL   Nitrite NEGATIVE  NEGATIVE   Leukocytes, UA TRACE (*) NEGATIVE  URINE MICROSCOPIC-ADD ON     Status: Abnormal   Collection Time    03/01/13  3:30 PM      Result Value Range   Squamous Epithelial / LPF MANY (*) RARE   WBC, UA 3-6  <3 WBC/hpf   Bacteria, UA MANY (*) RARE   Urine-Other MUCOUS PRESENT    CBC     Status: Abnormal   Collection Time    03/01/13  5:59 PM      Result Value Range   WBC 7.1  4.0 - 10.5 K/uL   RBC 4.07  3.87 - 5.11 MIL/uL   Hemoglobin 11.1 (*) 12.0 - 15.0 g/dL   HCT 02.7 (*) 25.3 - 66.4 %   MCV 77.9 (*) 78.0 - 100.0 fL   MCH 27.3  26.0 - 34.0 pg   MCHC 35.0  30.0 - 36.0  g/dL   RDW 40.3  47.4 - 25.9 %   Platelets 279  150 - 400 K/uL  COMPREHENSIVE METABOLIC PANEL     Status: Abnormal   Collection Time    03/01/13  5:59 PM      Result Value Range   Sodium 135  135 - 145 mEq/L   Potassium 3.1 (*) 3.5 - 5.1 mEq/L   Chloride 97  96 - 112 mEq/L   CO2 25  19 - 32 mEq/L   Glucose, Bld 82  70 - 99 mg/dL   BUN 8  6 - 23 mg/dL   Creatinine, Ser 5.63  0.50 - 1.10 mg/dL   Calcium 9.9  8.4 - 87.5 mg/dL   Total Protein 7.8  6.0 - 8.3 g/dL   Albumin 3.9  3.5 - 5.2 g/dL   AST 14  0 - 37 U/L   ALT 12  0 - 35 U/L   Alkaline Phosphatase 37 (*) 39 - 117 U/L   Total Bilirubin 0.3  0.3 - 1.2 mg/dL   GFR calc non Af Amer >90  >90 mL/min   GFR calc Af Amer >90  >90 mL/min     Assessment and Plan  Report given to M. Mayford Knife who assumes care of patient.  [facility] 03/01/2013, 5:49 PM   Urine appears infected Patient is halfway through IV infusion but wants to go home RN states she has eaten half a meal with no vomiting WIll Rx Keflex for UTI and culture urine Wynelle Bourgeois CNM

## 2013-03-01 NOTE — MAU Note (Signed)
Patient states she has had nausea and vomiting but worse for the past 3 days. Denies bleeding but having a white stain in panties.

## 2013-03-02 ENCOUNTER — Encounter: Payer: Self-pay | Admitting: Obstetrics

## 2013-03-02 DIAGNOSIS — O210 Mild hyperemesis gravidarum: Secondary | ICD-10-CM

## 2013-03-02 LAB — URINE CULTURE: Colony Count: >=100000

## 2013-03-02 MED ORDER — CEPHALEXIN 500 MG PO CAPS: 500.0000 mg | ORAL_CAPSULE | Freq: Four times a day (QID) | ORAL | Status: DC

## 2013-03-03 NOTE — Progress Notes (Signed)
Patient rescheduled

## 2013-03-19 ENCOUNTER — Encounter: Payer: Self-pay | Admitting: Obstetrics

## 2013-03-20 ENCOUNTER — Ambulatory Visit (INDEPENDENT_AMBULATORY_CARE_PROVIDER_SITE_OTHER): Payer: PRIVATE HEALTH INSURANCE | Admitting: Obstetrics

## 2013-03-20 ENCOUNTER — Encounter: Payer: Self-pay | Admitting: Obstetrics

## 2013-03-20 VITALS — BP 122/81 | Temp 98.8°F | Wt 199.0 lb

## 2013-03-20 DIAGNOSIS — A499 Bacterial infection, unspecified: Secondary | ICD-10-CM

## 2013-03-20 DIAGNOSIS — Z3201 Encounter for pregnancy test, result positive: Secondary | ICD-10-CM

## 2013-03-20 DIAGNOSIS — B9689 Other specified bacterial agents as the cause of diseases classified elsewhere: Secondary | ICD-10-CM

## 2013-03-20 DIAGNOSIS — Z3401 Encounter for supervision of normal first pregnancy, first trimester: Secondary | ICD-10-CM

## 2013-03-20 DIAGNOSIS — N760 Acute vaginitis: Secondary | ICD-10-CM | POA: Insufficient documentation

## 2013-03-20 LAB — POCT URINALYSIS DIPSTICK
Bilirubin, UA: NEGATIVE
Glucose, UA: NEGATIVE
Nitrite, UA: NEGATIVE
Urobilinogen, UA: NEGATIVE

## 2013-03-20 MED ORDER — AMOXICILLIN-POT CLAVULANATE 875-125 MG PO TABS: 1.0000 | ORAL_TABLET | Freq: Two times a day (BID) | ORAL | Status: AC

## 2013-03-20 MED ORDER — OB COMPLETE PETITE 35-5-1-200 MG PO CAPS: 1.0000 | ORAL_CAPSULE | Freq: Every day | ORAL | Status: DC

## 2013-03-20 NOTE — Progress Notes (Signed)
P 84  Subjective:    Kristine Perez is being seen today for her first obstetrical visit.  This is not a planned pregnancy. She is at [redacted]w[redacted]d gestation. Her obstetrical history is significant for anemia, headache and excessive spit. Relationship with FOB: not involved at this time. Patient does intend to breast feed. Pregnancy history fully reviewed.  Menstrual History: OB History   Grav Para Term Preterm Abortions TAB SAB Ect Mult Living   1 0 0 0 0 0 0 0 0 0       Menarche age: not asked  Patient's last menstrual period was 12/13/2012.    The following portions of the patient's history were reviewed and updated as appropriate: allergies, current medications, past family history, past medical history, past social history, past surgical history and problem list.  Review of Systems Pertinent items are noted in HPI.    Objective:    General appearance: alert and no distress Abdomen: normal findings: soft, non-tender Pelvic: cervix normal in appearance, external genitalia normal, no adnexal masses or tenderness and no cervical motion tenderness  Uterus enlarged, soft, NT, ~ 14 weeks size. Vagina with grayish discharge.   Assessment:    Pregnancy at [redacted]w[redacted]d weeks    Plan:    Initial labs drawn. Prenatal vitamins. Problem list reviewed and updated. AFP3 discussed: requested. Role of ultrasound in pregnancy discussed; fetal survey: requested. Amniocentesis discussed: not indicated. Follow up in 2 weeks. 50% of 20 min visit spent on counseling and coordination of care.

## 2013-03-20 NOTE — Addendum Note (Signed)
Addended by: Julaine Hua on: 03/20/2013 01:41 PM   Modules accepted: Orders

## 2013-03-21 LAB — OBSTETRIC PANEL
Antibody Screen: NEGATIVE
Basophils Absolute: 0 10*3/uL (ref 0.0–0.1)
Eosinophils Absolute: 0 10*3/uL (ref 0.0–0.7)
Eosinophils Relative: 1 % (ref 0–5)
HCT: 31.6 % — ABNORMAL LOW (ref 36.0–46.0)
Lymphocytes Relative: 19 % (ref 12–46)
MCH: 27.1 pg (ref 26.0–34.0)
MCV: 81.4 fL (ref 78.0–100.0)
Monocytes Absolute: 0.5 10*3/uL (ref 0.1–1.0)
Platelets: 309 10*3/uL (ref 150–400)
RDW: 16.1 % — ABNORMAL HIGH (ref 11.5–15.5)
Rubella: 2.02 Index — ABNORMAL HIGH (ref <0.90)
WBC: 7.3 10*3/uL (ref 4.0–10.5)

## 2013-03-21 LAB — HIV ANTIBODY (ROUTINE TESTING W REFLEX): HIV: NONREACTIVE

## 2013-03-21 LAB — PAP IG W/ RFLX HPV ASCU

## 2013-03-21 LAB — CULTURE, OB URINE
Colony Count: NO GROWTH
Organism ID, Bacteria: NO GROWTH

## 2013-03-22 LAB — HEMOGLOBINOPATHY EVALUATION: Hgb A: 97.8 % (ref 96.8–97.8)

## 2013-04-02 ENCOUNTER — Ambulatory Visit (INDEPENDENT_AMBULATORY_CARE_PROVIDER_SITE_OTHER): Payer: PRIVATE HEALTH INSURANCE | Admitting: Obstetrics

## 2013-04-02 ENCOUNTER — Encounter: Payer: Self-pay | Admitting: Obstetrics

## 2013-04-02 VITALS — BP 127/80 | Temp 98.3°F | Wt 195.0 lb

## 2013-04-02 DIAGNOSIS — Z3400 Encounter for supervision of normal first pregnancy, unspecified trimester: Secondary | ICD-10-CM

## 2013-04-02 DIAGNOSIS — Z369 Encounter for antenatal screening, unspecified: Secondary | ICD-10-CM

## 2013-04-02 DIAGNOSIS — Z3401 Encounter for supervision of normal first pregnancy, first trimester: Secondary | ICD-10-CM

## 2013-04-02 LAB — POCT URINALYSIS DIPSTICK
Bilirubin, UA: NEGATIVE
Glucose, UA: NEGATIVE
Ketones, UA: NEGATIVE

## 2013-04-02 NOTE — Progress Notes (Signed)
Pulse-88 Pt c/o intermittent chest tightness x 1 month.

## 2013-04-04 LAB — AFP, QUAD SCREEN
Curr Gest Age: 15.6 wks.days
HCG, Total: 37161 m[IU]/mL
INH: 490.3 pg/mL
Interpretation-AFP: POSITIVE — AB
Open Spina bifida: NEGATIVE
Osb Risk: <1:54600 {titer}
Tri 18 Scr Risk Est: NEGATIVE
uE3 Mom: 1.12
uE3 Value: 0.5 ng/mL

## 2013-04-25 ENCOUNTER — Encounter: Payer: Self-pay | Admitting: Obstetrics & Gynecology

## 2013-04-30 ENCOUNTER — Encounter: Payer: Self-pay | Admitting: Obstetrics

## 2013-04-30 ENCOUNTER — Ambulatory Visit (INDEPENDENT_AMBULATORY_CARE_PROVIDER_SITE_OTHER): Payer: Medicaid Other | Admitting: Obstetrics

## 2013-04-30 VITALS — BP 119/77 | Temp 99.0°F | Wt 207.0 lb

## 2013-04-30 DIAGNOSIS — Z3402 Encounter for supervision of normal first pregnancy, second trimester: Secondary | ICD-10-CM

## 2013-04-30 DIAGNOSIS — Z3400 Encounter for supervision of normal first pregnancy, unspecified trimester: Secondary | ICD-10-CM

## 2013-04-30 DIAGNOSIS — Z369 Encounter for antenatal screening, unspecified: Secondary | ICD-10-CM

## 2013-04-30 LAB — POCT URINALYSIS DIPSTICK
Blood, UA: NEGATIVE
Glucose, UA: NEGATIVE
Spec Grav, UA: 1.015
Urobilinogen, UA: NEGATIVE

## 2013-04-30 NOTE — Progress Notes (Signed)
P-92 Pt states she is having lower abdomen pain.

## 2013-05-01 ENCOUNTER — Other Ambulatory Visit: Payer: Self-pay | Admitting: *Deleted

## 2013-05-01 DIAGNOSIS — Z3402 Encounter for supervision of normal first pregnancy, second trimester: Secondary | ICD-10-CM

## 2013-05-08 ENCOUNTER — Ambulatory Visit: Payer: PRIVATE HEALTH INSURANCE

## 2013-05-09 ENCOUNTER — Ambulatory Visit (HOSPITAL_COMMUNITY)
Admission: RE | Admit: 2013-05-09 | Discharge: 2013-05-09 | Disposition: A | Discharge status: Home / Self Care | Payer: Medicaid Other | Source: Ambulatory Visit | Attending: Obstetrics | Admitting: Obstetrics

## 2013-05-09 ENCOUNTER — Other Ambulatory Visit: Payer: Self-pay | Admitting: Obstetrics

## 2013-05-09 DIAGNOSIS — Z369 Encounter for antenatal screening, unspecified: Secondary | ICD-10-CM

## 2013-05-09 DIAGNOSIS — Z3689 Encounter for other specified antenatal screening: Secondary | ICD-10-CM | POA: Insufficient documentation

## 2013-05-09 DIAGNOSIS — O289 Unspecified abnormal findings on antenatal screening of mother: Secondary | ICD-10-CM | POA: Insufficient documentation

## 2013-05-09 NOTE — Progress Notes (Signed)
MFM ultrasound   Indication: 22 yr old G1P0 at [redacted]w[redacted]d for fetal anatomic survey. Abnormal quad screen with risk of trisomy 21 of 1:187 and elevated inhibin of 2.87. Remote read.  Findings: 1. Single intrauterine pregnancy. 2. Fetal biometry is consistent with dating. 3. Posterior placenta without evidence of previa. 4. Normal amniotic fluid volume. 5. Normal transabdominal cervical length. 6. The views of the cavum, posterior fossa, face, palate, heart, diaphragm, spine, hands, and ankles are limited. 7. There is an echogenic focus in the left ventricle. 8. The remainder of the limited anatomy survey is normal.  Recommendations: 1. Appropriate fetal growth. 2. Limited anatomy survey: - recommend follow up ultrasound with MFM in 1-2 weeks to complete anatomy 3. Abnormal quad screen: - recommend genetic counseling asap 4. Elevated inhibin: - this is associated with increased risk of preeclampsia, fetal growth restriction, abruption, preterm delivery, and stillbirth - recommend fetal growth ultrasounds every 4 weeks - recommend close surveillance for the development of signs/symptoms of preeclampsia 5. Echogenic focus in the left ventricle: - this finding increases the risk of trisomy 9 - recommend genetic counseling asap  Patient not counseled on the above as this was a remote read. Recommend follow up ultrasound with MFM for counseling and genetic counseling  Eulis Foster, MD

## 2013-05-18 ENCOUNTER — Other Ambulatory Visit: Payer: Self-pay | Admitting: Obstetrics

## 2013-05-18 DIAGNOSIS — O280 Abnormal hematological finding on antenatal screening of mother: Secondary | ICD-10-CM

## 2013-05-18 DIAGNOSIS — Z3689 Encounter for other specified antenatal screening: Secondary | ICD-10-CM

## 2013-05-28 ENCOUNTER — Ambulatory Visit (INDEPENDENT_AMBULATORY_CARE_PROVIDER_SITE_OTHER): Payer: Medicaid Other | Admitting: Obstetrics

## 2013-05-28 ENCOUNTER — Other Ambulatory Visit: Payer: PRIVATE HEALTH INSURANCE

## 2013-05-28 ENCOUNTER — Encounter: Payer: Self-pay | Admitting: Obstetrics

## 2013-05-28 VITALS — BP 125/81 | Temp 98.6°F | Wt 210.0 lb

## 2013-05-28 DIAGNOSIS — Z3400 Encounter for supervision of normal first pregnancy, unspecified trimester: Secondary | ICD-10-CM

## 2013-05-28 DIAGNOSIS — Z3402 Encounter for supervision of normal first pregnancy, second trimester: Secondary | ICD-10-CM

## 2013-05-28 LAB — POCT URINALYSIS DIPSTICK
Glucose, UA: NEGATIVE
Ketones, UA: NEGATIVE
Protein, UA: NEGATIVE
Spec Grav, UA: 1.015

## 2013-05-28 LAB — CBC
Platelets: 324 10*3/uL (ref 150–400)
RBC: 3.35 MIL/uL — ABNORMAL LOW (ref 3.87–5.11)
WBC: 8.8 10*3/uL (ref 4.0–10.5)

## 2013-05-28 NOTE — Progress Notes (Signed)
P = 92 

## 2013-05-29 ENCOUNTER — Ambulatory Visit (HOSPITAL_COMMUNITY)
Admission: RE | Admit: 2013-05-29 | Discharge: 2013-05-29 | Disposition: A | Discharge status: Home / Self Care | Payer: Medicaid Other | Source: Ambulatory Visit | Attending: Obstetrics | Admitting: Obstetrics

## 2013-05-29 ENCOUNTER — Other Ambulatory Visit: Payer: Self-pay

## 2013-05-29 ENCOUNTER — Encounter (HOSPITAL_COMMUNITY): Payer: Self-pay

## 2013-05-29 ENCOUNTER — Encounter: Payer: Self-pay | Admitting: Obstetrics

## 2013-05-29 VITALS — BP 130/71 | HR 97 | Wt 210.0 lb

## 2013-05-29 DIAGNOSIS — Z1389 Encounter for screening for other disorder: Secondary | ICD-10-CM | POA: Insufficient documentation

## 2013-05-29 DIAGNOSIS — Z363 Encounter for antenatal screening for malformations: Secondary | ICD-10-CM | POA: Insufficient documentation

## 2013-05-29 DIAGNOSIS — O289 Unspecified abnormal findings on antenatal screening of mother: Secondary | ICD-10-CM | POA: Insufficient documentation

## 2013-05-29 DIAGNOSIS — O358XX0 Maternal care for other (suspected) fetal abnormality and damage, not applicable or unspecified: Secondary | ICD-10-CM | POA: Insufficient documentation

## 2013-05-29 DIAGNOSIS — Z3689 Encounter for other specified antenatal screening: Secondary | ICD-10-CM

## 2013-05-29 DIAGNOSIS — O280 Abnormal hematological finding on antenatal screening of mother: Secondary | ICD-10-CM

## 2013-05-29 LAB — GLUCOSE TOLERANCE, 2 HOURS W/ 1HR
Glucose, 1 hour: 98 mg/dL (ref 70–170)
Glucose, Fasting: 61 mg/dL — ABNORMAL LOW (ref 70–99)

## 2013-05-29 LAB — US OB DETAIL + 14 WK

## 2013-05-29 LAB — HIV ANTIBODY (ROUTINE TESTING W REFLEX): HIV: NONREACTIVE

## 2013-05-29 NOTE — Progress Notes (Signed)
Genetic Counseling  High-Risk Gestation Note  Appointment Date:  05/29/2013 Referred By: Brock Bad, MD Date of Birth:  21-Nov-1990    Pregnancy History: G1P0000 Estimated Date of Delivery: 09/19/13 Estimated Gestational Age: [redacted]w[redacted]d Attending: Alpha Gula, MD   Kristine Perez was seen for genetic counseling because of an increased risk for fetal Down syndrome based on Quad screen performed through [facility] laboratory and the previous ultrasound finding of echogenic intracardiac focus.  She was counseled regarding the Quad screen result and the associated 1 in 187 risk for fetal Down syndrome.  We reviewed chromosomes, nondisjunction, and the common features and variable prognosis of Down syndrome.  In addition, we reviewed the screen adjusted reduction in risks for trisomy 18 (1 in 45,400) and ONTDs (< 1 in 54,600).  We also discussed other explanations for a screen positive result including: differences in maternal metabolism and normal variation. She understands that this screening is not diagnostic for Down syndrome but provides a risk assessment. Additionally, we discussed that the levels of one of the proteins analyzed on the Quad screen, DIA, was very elevated (2.87 MoM). This has been associated with an increased risk for growth restriction or poor pregnancy outcome later in pregnancy; therefore, we would recommend a follow up ultrasound for fetal growth in the third trimester.  We reviewed available screening options including noninvasive prenatal screening (NIPS)/cell free fetal DNA (cffDNA) testing and detailed ultrasound.  She was counseled that screening tests are used to modify a patient's a priori risk for aneuploidy, typically based on age. This estimate provides a pregnancy specific risk assessment. We reviewed the benefits and limitations of each option. Specifically, we discussed the conditions for which each test screens, the detection rates, and false positive rates  of each. She was also counseled regarding diagnostic testing via amniocentesis. We reviewed the approximate 1 in 300-500 risk for complications for amniocentesis, including spontaneous pregnancy loss.   Detailed ultrasound was performed today. Echogenic intracardiac focus was visualized at this time. Complete ultrasound results reported separately. An isolated echogenic focus is generally believed to be a normal variation without any concerns for the pregnancy.  Isolated echogenic cardiac foci are not associated with congenital heart defects in the baby or compromised cardiac function after birth.  However, an echogenic cardiac focus is associated with a slightly increased chance for Down syndrome in the pregnancy. Thus, we discussed that the presence of an EIF would increase the risk for Down syndrome above the patient's Quad screen result of 1 in 187 to approximately 1 in 94. After careful consideration, Kristine Perez elected to proceed with NIPS (Panorama) at the time of today's visit. These results will be available in approximately 8-10 days. The patient declined amniocentesis at the time of today's visit. She understands that screening tests cannot rule out all birth defects or genetic syndromes.  Follow-up ultrasound was planned for 07/10/13.   Kristine Perez was provided with written information regarding sickle cell anemia (SCA) including the carrier frequency and incidence in the African-American population, the availability of carrier testing and prenatal diagnosis if indicated.  In addition, we discussed that hemoglobinopathies are routinely screened for as part of the [place] newborn screening panel.  She previously had hemoglobin electrophoresis performed through her OB office, which indicated the presence of normal adult hemoglobin. Thus, Kristine Perez does not appear to have sickle cell trait or other hemoglobin variant.    We also discussed information regarding cystic fibrosis (CF) including the  carrier frequency  and incidence in the Caucasian and African American populations, the availability of carrier testing and prenatal diagnosis if indicated.  In addition, we discussed that CF is routinely screened for as part of the [place] newborn screening panel.  She declined testing today.   Both family histories were reviewed and found to be noncontributory for birth defects, intellectual disability, recurrent pregnancy loss, or known genetic conditions. Without further information regarding the provided family history, an accurate genetic risk cannot be calculated. Kristine Perez was not familiar with the father of the baby's family history.  We, therefore, cannot comment on how his history might contribute to the overall chance for the baby to have a birth defect.  Further genetic counseling is warranted if more information is obtained.  Kristine Perez denied exposure to environmental toxins or chemical agents. She denied the use of alcohol, tobacco or street drugs. She denied significant viral illnesses during the course of her pregnancy. Her medical and surgical histories were noncontributory.   I counseled Kristine Perez for approximately 30 minutes regarding the above risks and available options.   Quinn Plowman, MS,  Certified [employer] 05/29/2013

## 2013-05-29 NOTE — Progress Notes (Signed)
Kristine Perez  was seen today for an ultrasound appointment.  See full report in [facility].  Comments: [name] was referred due to elevated risk for Down syndrome by Quad screen (1:187 risk).  Of note, an elevated inhibin level of 2.87 MoM was noted on the quad screen.  See separate note from [employer], broadcasting/film/video.  After counseling, the patient elected to undergo NIPS (Panorama)  An echogenic focus was seen in the left cardiac ventricle.  This is felt to represent a calcified papillary muscle, and is not associated with structural or functional cardiac abnormalities.  Although an echogenic cardiac focus may be associated with an increased risk of Down syndrome, this risk is felt to be minimal, especially when it is seen as an isolated finding.  No other markers associated with aneuploid were noted.  Impression: Single IUP at 24 0/7 weeks Echogenic intracardiac focus was noted in the left ventricle. The remainder of the fetal anaatomy was within normal limits. No other markers associated with aneuploidy were noted Normal amniotic fluid volume  Recommendations: Recommend follow-up ultrasound examination in 6 weeks for interval growth due to elevated inhibin level.  Alpha Gula, MD

## 2013-06-01 ENCOUNTER — Other Ambulatory Visit: Payer: Self-pay | Admitting: *Deleted

## 2013-06-01 DIAGNOSIS — D649 Anemia, unspecified: Secondary | ICD-10-CM

## 2013-06-01 MED ORDER — FUSION PLUS PO CAPS: 1.0000 | ORAL_CAPSULE | Freq: Every day | ORAL | Status: DC

## 2013-06-05 ENCOUNTER — Telehealth (HOSPITAL_COMMUNITY): Payer: Self-pay | Admitting: MS"

## 2013-06-05 ENCOUNTER — Ambulatory Visit (HOSPITAL_COMMUNITY): Payer: PRIVATE HEALTH INSURANCE

## 2013-06-05 NOTE — Telephone Encounter (Signed)
Called Kristine Perez to discuss her cell free fetal DNA test results.  Mrs. Kristine Perez had Panorama testing through [facility].  Testing was offered because of abnormal maternal serum screening for Down syndrome and previous ultrasound finding of echogenic intracardiac focus.   The patient was identified by name and DOB.  We reviewed that these are within normal limits, showing a less than 1 in 10,000 risk for trisomies 21, 18 and 13, and monosomy X (Turner syndrome).  In addition, the risk for triploidy/vanishing twin and sex chromosome trisomies (47,XXX and 47,XXY) was also low risk.  We reviewed that this testing identifies > 99% of pregnancies with trisomy 29, trisomy 60, trisomy 49, sex chromosome trisomies (47,XXX and 47,XXY), and triploidy.  The detection rate for monosomy X is ~92%.  The false positive rate is <0.1% for all conditions. Testing was also consistent with female gender.  She understands that this testing does not identify all genetic conditions.  All questions were answered to her satisfaction, she was encouraged to call with additional questions or concerns.  Quinn Plowman, MS Certified Genetic Counselor 06/05/2013 3:51 PM

## 2013-06-14 ENCOUNTER — Encounter: Payer: PRIVATE HEALTH INSURANCE | Admitting: Obstetrics

## 2013-06-26 ENCOUNTER — Ambulatory Visit (INDEPENDENT_AMBULATORY_CARE_PROVIDER_SITE_OTHER): Payer: Medicaid Other | Admitting: Obstetrics

## 2013-06-26 ENCOUNTER — Encounter: Payer: Self-pay | Admitting: Obstetrics

## 2013-06-26 VITALS — BP 119/77 | Temp 97.3°F | Wt 214.0 lb

## 2013-06-26 DIAGNOSIS — L0292 Furuncle, unspecified: Secondary | ICD-10-CM

## 2013-06-26 DIAGNOSIS — Z3400 Encounter for supervision of normal first pregnancy, unspecified trimester: Secondary | ICD-10-CM

## 2013-06-26 DIAGNOSIS — Z3402 Encounter for supervision of normal first pregnancy, second trimester: Secondary | ICD-10-CM

## 2013-06-26 LAB — POCT URINALYSIS DIPSTICK
Bilirubin, UA: NEGATIVE
Blood, UA: NEGATIVE
Ketones, UA: NEGATIVE
Leukocytes, UA: NEGATIVE
Spec Grav, UA: <=1.005
pH, UA: 8

## 2013-06-26 MED ORDER — MUPIROCIN CALCIUM 2 % EX CREA: TOPICAL_CREAM | Freq: Three times a day (TID) | CUTANEOUS | Status: DC

## 2013-06-26 MED ORDER — OMEPRAZOLE 20 MG PO CPDR: 20.0000 mg | DELAYED_RELEASE_CAPSULE | Freq: Two times a day (BID) | ORAL | Status: DC

## 2013-06-26 NOTE — Progress Notes (Signed)
P- 94 Pt states she is having increasing heartburn.

## 2013-07-10 ENCOUNTER — Ambulatory Visit (HOSPITAL_COMMUNITY): Payer: PRIVATE HEALTH INSURANCE

## 2013-07-11 ENCOUNTER — Encounter: Payer: Self-pay | Admitting: Obstetrics

## 2013-07-11 ENCOUNTER — Encounter: Payer: Self-pay | Admitting: *Deleted

## 2013-07-11 ENCOUNTER — Ambulatory Visit (INDEPENDENT_AMBULATORY_CARE_PROVIDER_SITE_OTHER): Payer: Medicaid Other | Admitting: Obstetrics

## 2013-07-11 VITALS — BP 128/82 | Temp 97.6°F | Wt 215.0 lb

## 2013-07-11 DIAGNOSIS — Z3403 Encounter for supervision of normal first pregnancy, third trimester: Secondary | ICD-10-CM

## 2013-07-11 DIAGNOSIS — K219 Gastro-esophageal reflux disease without esophagitis: Secondary | ICD-10-CM | POA: Insufficient documentation

## 2013-07-11 DIAGNOSIS — Z3400 Encounter for supervision of normal first pregnancy, unspecified trimester: Secondary | ICD-10-CM

## 2013-07-11 LAB — POCT URINALYSIS DIPSTICK
Bilirubin, UA: NEGATIVE
Blood, UA: NEGATIVE
Glucose, UA: NEGATIVE
Ketones, UA: NEGATIVE
Nitrite, UA: NEGATIVE
Spec Grav, UA: 1.01

## 2013-07-11 NOTE — Progress Notes (Signed)
Pulse-98 Pt states she is having lower abdomen pressure.

## 2013-07-12 ENCOUNTER — Ambulatory Visit (HOSPITAL_COMMUNITY)
Admission: RE | Admit: 2013-07-12 | Discharge: 2013-07-12 | Disposition: A | Discharge status: Home / Self Care | Payer: Medicaid Other | Source: Ambulatory Visit | Attending: Obstetrics | Admitting: Obstetrics

## 2013-07-12 DIAGNOSIS — O289 Unspecified abnormal findings on antenatal screening of mother: Secondary | ICD-10-CM | POA: Insufficient documentation

## 2013-07-12 DIAGNOSIS — O280 Abnormal hematological finding on antenatal screening of mother: Secondary | ICD-10-CM

## 2013-07-12 NOTE — Progress Notes (Signed)
Kristine Perez  was seen today for an ultrasound appointment.  See full report in [facility].  Impression: Single IUP at 30 2/7 weeks Echogenic intracardia focus is again noted Otherwise normal interval anatomy Fetal growth is appropriate (70th %tile) Normal amniotic fluid volume  Recommendations: Follow-up ultrasounds as clinically indicated.   Alpha Gula, MD

## 2013-07-26 ENCOUNTER — Encounter: Payer: Self-pay | Admitting: Obstetrics

## 2013-07-26 ENCOUNTER — Encounter: Payer: PRIVATE HEALTH INSURANCE | Admitting: Obstetrics

## 2013-07-26 ENCOUNTER — Ambulatory Visit (INDEPENDENT_AMBULATORY_CARE_PROVIDER_SITE_OTHER): Payer: Medicaid Other | Admitting: Obstetrics

## 2013-07-26 VITALS — BP 128/77 | Temp 98.8°F | Wt 216.0 lb

## 2013-07-26 DIAGNOSIS — Z3403 Encounter for supervision of normal first pregnancy, third trimester: Secondary | ICD-10-CM

## 2013-07-26 DIAGNOSIS — Z3400 Encounter for supervision of normal first pregnancy, unspecified trimester: Secondary | ICD-10-CM

## 2013-07-26 LAB — POCT URINALYSIS DIPSTICK
Bilirubin, UA: NEGATIVE
Glucose, UA: NEGATIVE
Spec Grav, UA: 1.01
Urobilinogen, UA: NEGATIVE

## 2013-07-26 NOTE — Progress Notes (Signed)
P 101 Patient reports she has severe lower abdominal discomfort- especially when she wakes up

## 2013-07-31 ENCOUNTER — Inpatient Hospital Stay (HOSPITAL_COMMUNITY)
Admission: AD | Admit: 2013-07-31 | Discharge: 2013-07-31 | Disposition: A | Discharge status: Home / Self Care | Payer: Medicaid Other | Source: Ambulatory Visit | Attending: Obstetrics | Admitting: Obstetrics

## 2013-07-31 ENCOUNTER — Encounter (HOSPITAL_COMMUNITY): Payer: Self-pay | Admitting: *Deleted

## 2013-07-31 DIAGNOSIS — O4700 False labor before 37 completed weeks of gestation, unspecified trimester: Secondary | ICD-10-CM | POA: Insufficient documentation

## 2013-07-31 DIAGNOSIS — O479 False labor, unspecified: Secondary | ICD-10-CM

## 2013-07-31 DIAGNOSIS — O4703 False labor before 37 completed weeks of gestation, third trimester: Secondary | ICD-10-CM

## 2013-07-31 LAB — URINE MICROSCOPIC-ADD ON

## 2013-07-31 LAB — URINALYSIS, ROUTINE W REFLEX MICROSCOPIC
Glucose, UA: NEGATIVE mg/dL
Protein, ur: NEGATIVE mg/dL
Urobilinogen, UA: 0.2 mg/dL (ref 0.0–1.0)

## 2013-07-31 MED ORDER — BUTALBITAL-APAP-CAFFEINE 50-325-40 MG PO TABS: 1.0000 | ORAL_TABLET | Freq: Four times a day (QID) | ORAL | Status: DC | PRN

## 2013-07-31 NOTE — MAU Note (Signed)
Feels contractions about once an hour x4 days. Denies LOF or VB. Positive fetal movement.

## 2013-07-31 NOTE — MAU Provider Note (Signed)
History     CSN: [id number]  Arrival date and time: 07/31/13 1901  First Provider Initiated Contact with Patient 07/31/13 1936.    Chief Complaint  Patient presents with  . Contractions   HPI Pt c/o of contractions for 4 days once every 20-30 minutes or once an hour .  Pt c/o of lower abdominal pain  Pt denies pain with urination.  Pt states she had diarrhea yesterday - loose stool when she urinated.  Pt denies fever. Pt also has headaches which she has had every other day for the duration of her pregnancy. Pt has nausea but no vomiting.  Pt is eating and drinking fluids.   Pt saw Dr. Clearance Coots on Thursday and has an appt to see Dr. Clearance Coots on Nov 3.  RN note: Feels contractions about once an hour x4 days. Denies LOF or VB. Positive fetal movement   Past Medical History  Diagnosis Date  . Headache(784.0)     migraines  . Anemia     Past Surgical History  Procedure Laterality Date  . Abscess      under arm  . No past surgeries      Family History  Problem Relation Age of Onset  . Hypertension Mother   . Mental illness Mother   . Diabetes Maternal Grandmother   . Heart disease Maternal Grandmother   . Diabetes Paternal Grandmother   . Hypertension Paternal Grandmother   . Cancer Paternal Grandmother     History  Substance Use Topics  . Smoking status: Never Smoker   . Smokeless tobacco: Never Used  . Alcohol Use: No     Comment: weekends until + UPT    Allergies: No Known Allergies  Prescriptions prior to admission  Medication Sig Dispense Refill  . cephALEXin (KEFLEX) 500 MG capsule Take 1 capsule (500 mg total) by mouth 4 (four) times daily.  28 capsule  0  . flintstones complete (FLINTSTONES) 60 MG chewable tablet Chew 2 tablets by mouth daily.      . Iron-FA-B Cmp-C-Biot-Probiotic (FUSION PLUS) CAPS Take 1 capsule by mouth daily.  30 capsule  11  . mupirocin cream (BACTROBAN) 2 % Apply topically 3 (three) times daily.  30 g  4  . omeprazole (PRILOSEC) 20  MG capsule Take 1 capsule (20 mg total) by mouth 2 (two) times daily.  60 capsule  5  . ondansetron (ZOFRAN) 8 MG tablet Take 1 tablet (8 mg total) by mouth every 8 (eight) hours as needed for nausea.  30 tablet  1  . Prenat-FeCbn-FeAspGl-FA-Omega (OB COMPLETE PETITE) 35-5-1-200 MG CAPS Take 1 capsule by mouth daily before breakfast.  90 capsule  3  . promethazine (PHENERGAN) 25 MG tablet Take 1 tablet (25 mg total) by mouth every 6 (six) hours as needed for nausea.  30 tablet  1   Care turned over to [place], [place].  LINEBERRY,SUSAN 07/31/2013, 7:37 PM   Review of Systems  Constitutional: Negative for fever and chills.  HENT: Negative for hearing loss.        Ear pressure  Eyes: Negative for blurred vision and double vision.  Gastrointestinal: Positive for abdominal pain (contractions).  Genitourinary: Negative for dysuria, urgency, frequency, hematuria and flank pain.  Neurological: Positive for headaches (occassional, milld-mod generalized. ).   Physical Exam   Blood pressure 130/66, pulse 95, temperature 98.9 F (37.2 C), temperature source Oral, resp. rate 18, height 5' 6.5" (1.689 m), weight 98.431 kg (217 lb), last menstrual period 12/13/2012, SpO2 100.00%.  Physical Exam  Constitutional: She is oriented to person, place, and time. She appears well-developed and well-nourished. No distress.  Eyes: Conjunctivae are normal.  Cardiovascular: Normal rate.   Respiratory: Effort normal.  GI: Soft. There is no tenderness.  Genitourinary: Vagina normal.  Musculoskeletal: She exhibits no edema.  Neurological: She is alert and oriented to person, place, and time. She has normal reflexes.  Skin: Skin is warm and dry.  Psychiatric: She has a normal mood and affect.  Dilation: Closed Effacement (%): 50 Cervical Position: Posterior Station: -3 Exam by:: Ivonne Andrew CNM  EFM: 140, moderate variability, pos accels, neg decels. Toco: 1 contraction.   MAU Course   Procedures  Assessment and Plan   1. Preterm contractions, third trimester    Plan: D/C home in stable condition. Discuss ear pressure w/ PCP.  Preterm labor precautions. FKCs.  Follow-up Information   Follow up with [facility] On 08/06/2013. (as scheduled or as needed if symptoms worsen)    Contact information:   [address] [place] [address] [phone]      Follow up with [facility]. (As needed if symptoms worsen)    Contact information:   [address] [id number] [place] [address] [phone]       Medication List    STOP taking these medications       cephALEXin 500 MG capsule  Commonly known as:  KEFLEX      TAKE these medications       butalbital-acetaminophen-caffeine 50-325-40 MG per tablet  Commonly known as:  FIORICET  Take 1-2 tablets by mouth every 6 (six) hours as needed for headache.     flintstones complete 60 MG chewable tablet  Chew 2 tablets by mouth daily.     FUSION PLUS Caps  Take 1 capsule by mouth daily.     mupirocin cream 2 %  Commonly known as:  BACTROBAN  Apply topically 3 (three) times daily.     OB COMPLETE PETITE 35-5-1-200 MG Caps  Take 1 capsule by mouth daily before breakfast.     omeprazole 20 MG capsule  Commonly known as:  PRILOSEC  Take 1 capsule (20 mg total) by mouth 2 (two) times daily.     ondansetron 8 MG tablet  Commonly known as:  ZOFRAN  Take 1 tablet (8 mg total) by mouth every 8 (eight) hours as needed for nausea.     promethazine 25 MG tablet  Commonly known as:  PHENERGAN  Take 1 tablet (25 mg total) by mouth every 6 (six) hours as needed for nausea.       [place], [place] 07/31/2013 9:28 PM

## 2013-08-01 LAB — URINE CULTURE

## 2013-08-06 ENCOUNTER — Ambulatory Visit (INDEPENDENT_AMBULATORY_CARE_PROVIDER_SITE_OTHER): Payer: Medicaid Other | Admitting: Obstetrics

## 2013-08-06 VITALS — BP 131/77 | Temp 97.8°F | Wt 216.0 lb

## 2013-08-06 DIAGNOSIS — Z3480 Encounter for supervision of other normal pregnancy, unspecified trimester: Secondary | ICD-10-CM

## 2013-08-06 DIAGNOSIS — O368190 Decreased fetal movements, unspecified trimester, not applicable or unspecified: Secondary | ICD-10-CM

## 2013-08-06 DIAGNOSIS — Z3483 Encounter for supervision of other normal pregnancy, third trimester: Secondary | ICD-10-CM

## 2013-08-06 DIAGNOSIS — R8290 Unspecified abnormal findings in urine: Secondary | ICD-10-CM

## 2013-08-06 DIAGNOSIS — R82998 Other abnormal findings in urine: Secondary | ICD-10-CM

## 2013-08-06 DIAGNOSIS — O368131 Decreased fetal movements, third trimester, fetus 1: Secondary | ICD-10-CM

## 2013-08-06 LAB — POCT URINALYSIS DIPSTICK
Protein, UA: NEGATIVE
Urobilinogen, UA: NEGATIVE

## 2013-08-06 NOTE — Progress Notes (Signed)
Pulse- 101 Pt states she has not felt her baby move since yesterday evening. Pt states she is having pressure in her lower abdomen. Pt states she has tried resting, eating and drinking fluids and the baby still did not react.

## 2013-08-07 ENCOUNTER — Encounter: Payer: Self-pay | Admitting: Obstetrics

## 2013-08-07 LAB — CULTURE, OB URINE: Colony Count: NO GROWTH

## 2013-08-13 ENCOUNTER — Other Ambulatory Visit: Payer: Self-pay | Admitting: *Deleted

## 2013-08-13 DIAGNOSIS — L0292 Furuncle, unspecified: Secondary | ICD-10-CM

## 2013-08-13 DIAGNOSIS — Z3401 Encounter for supervision of normal first pregnancy, first trimester: Secondary | ICD-10-CM

## 2013-08-13 DIAGNOSIS — D649 Anemia, unspecified: Secondary | ICD-10-CM

## 2013-08-13 MED ORDER — FUSION PLUS PO CAPS: 1.0000 | ORAL_CAPSULE | Freq: Every day | ORAL | Status: DC

## 2013-08-13 MED ORDER — OB COMPLETE PETITE 35-5-1-200 MG PO CAPS: 1.0000 | ORAL_CAPSULE | Freq: Every day | ORAL | Status: DC

## 2013-08-13 MED ORDER — MUPIROCIN CALCIUM 2 % EX CREA: TOPICAL_CREAM | Freq: Three times a day (TID) | CUTANEOUS | Status: DC

## 2013-08-20 ENCOUNTER — Encounter: Payer: Medicaid Other | Admitting: Obstetrics

## 2013-08-20 ENCOUNTER — Ambulatory Visit (INDEPENDENT_AMBULATORY_CARE_PROVIDER_SITE_OTHER): Payer: Medicaid Other | Admitting: Obstetrics

## 2013-08-20 VITALS — BP 128/76 | Temp 98.2°F | Wt 220.0 lb

## 2013-08-20 DIAGNOSIS — Z3403 Encounter for supervision of normal first pregnancy, third trimester: Secondary | ICD-10-CM

## 2013-08-20 DIAGNOSIS — Z3400 Encounter for supervision of normal first pregnancy, unspecified trimester: Secondary | ICD-10-CM

## 2013-08-20 LAB — POCT URINALYSIS DIPSTICK
Blood, UA: NEGATIVE
Protein, UA: NEGATIVE
Spec Grav, UA: <=1.005
Urobilinogen, UA: NEGATIVE

## 2013-08-20 NOTE — Progress Notes (Signed)
p 93  Patient reports two episodes of bloody show when going to bathroom.  Also states that when in shower noticed watery discharged. She states that she is still leaking small amount of fluid.

## 2013-08-21 LAB — WET PREP BY MOLECULAR PROBE: Candida species: POSITIVE — AB

## 2013-08-27 ENCOUNTER — Ambulatory Visit (INDEPENDENT_AMBULATORY_CARE_PROVIDER_SITE_OTHER): Payer: Medicaid Other | Admitting: Obstetrics

## 2013-08-27 VITALS — BP 115/79 | Temp 98.9°F | Wt 225.0 lb

## 2013-08-27 DIAGNOSIS — Z3403 Encounter for supervision of normal first pregnancy, third trimester: Secondary | ICD-10-CM

## 2013-08-27 DIAGNOSIS — Z3400 Encounter for supervision of normal first pregnancy, unspecified trimester: Secondary | ICD-10-CM

## 2013-08-27 DIAGNOSIS — O2243 Hemorrhoids in pregnancy, third trimester: Secondary | ICD-10-CM

## 2013-08-27 DIAGNOSIS — K5900 Constipation, unspecified: Secondary | ICD-10-CM

## 2013-08-27 DIAGNOSIS — O228X9 Other venous complications in pregnancy, unspecified trimester: Secondary | ICD-10-CM

## 2013-08-27 LAB — POCT URINALYSIS DIPSTICK
Bilirubin, UA: NEGATIVE
Ketones, UA: NEGATIVE
Nitrite, UA: NEGATIVE

## 2013-08-27 MED ORDER — HYDROCORTISONE ACETATE 25 MG RE SUPP: 25.0000 mg | Freq: Two times a day (BID) | RECTAL | Status: DC

## 2013-08-27 MED ORDER — DOCUSATE SODIUM 100 MG PO CAPS: 100.0000 mg | ORAL_CAPSULE | Freq: Two times a day (BID) | ORAL | Status: DC

## 2013-08-27 NOTE — Progress Notes (Signed)
Pulse: 97

## 2013-08-28 ENCOUNTER — Encounter: Payer: Self-pay | Admitting: Obstetrics

## 2013-08-28 DIAGNOSIS — K5900 Constipation, unspecified: Secondary | ICD-10-CM | POA: Insufficient documentation

## 2013-08-28 DIAGNOSIS — IMO0002 Reserved for concepts with insufficient information to code with codable children: Secondary | ICD-10-CM | POA: Insufficient documentation

## 2013-09-02 ENCOUNTER — Encounter (HOSPITAL_COMMUNITY): Payer: Medicaid Other | Admitting: Anesthesiology

## 2013-09-02 ENCOUNTER — Encounter (HOSPITAL_COMMUNITY): Payer: Self-pay | Admitting: *Deleted

## 2013-09-02 ENCOUNTER — Inpatient Hospital Stay (HOSPITAL_COMMUNITY)
Admission: AD | Admit: 2013-09-02 | Discharge: 2013-09-06 | DRG: 766 | Disposition: A | Discharge status: Home / Self Care | Payer: Medicaid Other | Source: Ambulatory Visit | Attending: Obstetrics & Gynecology | Admitting: Obstetrics & Gynecology

## 2013-09-02 ENCOUNTER — Inpatient Hospital Stay (HOSPITAL_COMMUNITY): Payer: Medicaid Other | Admitting: Anesthesiology

## 2013-09-02 DIAGNOSIS — D509 Iron deficiency anemia, unspecified: Secondary | ICD-10-CM | POA: Diagnosis present

## 2013-09-02 DIAGNOSIS — O4290 Premature rupture of membranes, unspecified as to length of time between rupture and onset of labor, unspecified weeks of gestation: Secondary | ICD-10-CM | POA: Diagnosis present

## 2013-09-02 DIAGNOSIS — O9902 Anemia complicating childbirth: Secondary | ICD-10-CM | POA: Diagnosis present

## 2013-09-02 DIAGNOSIS — IMO0001 Reserved for inherently not codable concepts without codable children: Secondary | ICD-10-CM

## 2013-09-02 DIAGNOSIS — O324XX0 Maternal care for high head at term, not applicable or unspecified: Secondary | ICD-10-CM | POA: Diagnosis present

## 2013-09-02 LAB — CBC
Hemoglobin: 8.1 g/dL — ABNORMAL LOW (ref 12.0–15.0)
MCH: 24.2 pg — ABNORMAL LOW (ref 26.0–34.0)
MCHC: 32.7 g/dL (ref 30.0–36.0)
MCV: 74 fL — ABNORMAL LOW (ref 78.0–100.0)
Platelets: 220 10*3/uL (ref 150–400)
RDW: 13.7 % (ref 11.5–15.5)

## 2013-09-02 LAB — ABO/RH: ABO/RH(D): O POS

## 2013-09-02 LAB — RPR: RPR Ser Ql: NONREACTIVE

## 2013-09-02 MED ORDER — TERBUTALINE SULFATE 1 MG/ML IJ SOLN: 0.2500 mg | Freq: Once | INTRAMUSCULAR | Status: AC | PRN

## 2013-09-02 MED ORDER — LIDOCAINE HCL (PF) 1 % IJ SOLN
30.0000 mL | INTRAMUSCULAR | Status: DC | PRN
  Filled 2013-09-02: qty 30

## 2013-09-02 MED ORDER — OXYTOCIN 40 UNITS IN LACTATED RINGERS INFUSION - SIMPLE MED
1.0000 m[IU]/min | INTRAVENOUS | Status: DC
  Administered 2013-09-02: 6 m[IU]/min via INTRAVENOUS
  Administered 2013-09-02: 1 m[IU]/min via INTRAVENOUS
  Administered 2013-09-03: 8 m[IU]/min via INTRAVENOUS
  Filled 2013-09-02: qty 1000

## 2013-09-02 MED ORDER — IBUPROFEN 600 MG PO TABS: 600.0000 mg | ORAL_TABLET | Freq: Four times a day (QID) | ORAL | Status: DC | PRN

## 2013-09-02 MED ORDER — FENTANYL 2.5 MCG/ML BUPIVACAINE 1/10 % EPIDURAL INFUSION (WH - ANES)
14.0000 mL/h | INTRAMUSCULAR | Status: DC | PRN
  Administered 2013-09-02 – 2013-09-03 (×2): 14 mL/h via EPIDURAL
  Filled 2013-09-02 (×4): qty 125

## 2013-09-02 MED ORDER — ACETAMINOPHEN 325 MG PO TABS
650.0000 mg | ORAL_TABLET | ORAL | Status: DC | PRN
  Administered 2013-09-02: 650 mg via ORAL
  Filled 2013-09-02: qty 2

## 2013-09-02 MED ORDER — EPHEDRINE 5 MG/ML INJ
10.0000 mg | INTRAVENOUS | Status: DC | PRN
  Filled 2013-09-02: qty 4

## 2013-09-02 MED ORDER — OXYTOCIN 40 UNITS IN LACTATED RINGERS INFUSION - SIMPLE MED: 62.5000 mL/h | INTRAVENOUS | Status: DC

## 2013-09-02 MED ORDER — DIPHENHYDRAMINE HCL 50 MG/ML IJ SOLN: 12.5000 mg | INTRAMUSCULAR | Status: DC | PRN

## 2013-09-02 MED ORDER — FENTANYL 2.5 MCG/ML BUPIVACAINE 1/10 % EPIDURAL INFUSION (WH - ANES)
INTRAMUSCULAR | Status: DC | PRN
  Administered 2013-09-02: 14 mL/h via EPIDURAL

## 2013-09-02 MED ORDER — OXYCODONE-ACETAMINOPHEN 5-325 MG PO TABS: 1.0000 | ORAL_TABLET | ORAL | Status: DC | PRN

## 2013-09-02 MED ORDER — OXYTOCIN BOLUS FROM INFUSION: 500.0000 mL | INTRAVENOUS | Status: DC

## 2013-09-02 MED ORDER — LACTATED RINGERS IV SOLN: 500.0000 mL | INTRAVENOUS | Status: DC | PRN

## 2013-09-02 MED ORDER — SODIUM BICARBONATE 8.4 % IV SOLN
INTRAVENOUS | Status: DC | PRN
  Administered 2013-09-02 – 2013-09-03 (×5): 5 mL via EPIDURAL
  Administered 2013-09-03: 10 mL via EPIDURAL
  Administered 2013-09-03: 5 mL via EPIDURAL
  Administered 2013-09-03: 10 mL via EPIDURAL
  Administered 2013-09-03: 5 mL via EPIDURAL

## 2013-09-02 MED ORDER — CITRIC ACID-SODIUM CITRATE 334-500 MG/5ML PO SOLN
30.0000 mL | ORAL | Status: DC | PRN
  Administered 2013-09-03: 30 mL via ORAL
  Filled 2013-09-02: qty 15

## 2013-09-02 MED ORDER — PHENYLEPHRINE 40 MCG/ML (10ML) SYRINGE FOR IV PUSH (FOR BLOOD PRESSURE SUPPORT)
80.0000 ug | PREFILLED_SYRINGE | INTRAVENOUS | Status: DC | PRN
  Filled 2013-09-02: qty 10

## 2013-09-02 MED ORDER — EPHEDRINE 5 MG/ML INJ: 10.0000 mg | INTRAVENOUS | Status: DC | PRN

## 2013-09-02 MED ORDER — PROMETHAZINE HCL 25 MG/ML IJ SOLN
25.0000 mg | Freq: Four times a day (QID) | INTRAMUSCULAR | Status: DC | PRN
  Administered 2013-09-02: 25 mg via INTRAMUSCULAR
  Filled 2013-09-02: qty 1

## 2013-09-02 MED ORDER — FLEET ENEMA 7-19 GM/118ML RE ENEM: 1.0000 | ENEMA | Freq: Once | RECTAL | Status: DC

## 2013-09-02 MED ORDER — LIDOCAINE HCL (PF) 1 % IJ SOLN
INTRAMUSCULAR | Status: DC | PRN
  Administered 2013-09-02 (×2): 5 mL
  Administered 2013-09-02 (×2): 9 mL

## 2013-09-02 MED ORDER — NALBUPHINE SYRINGE 5 MG/0.5 ML
10.0000 mg | INJECTION | INTRAMUSCULAR | Status: DC | PRN
  Administered 2013-09-02: 10 mg via INTRAVENOUS
  Filled 2013-09-02: qty 1

## 2013-09-02 MED ORDER — LACTATED RINGERS IV SOLN: 500.0000 mL | Freq: Once | INTRAVENOUS | Status: DC

## 2013-09-02 MED ORDER — LACTATED RINGERS IV SOLN
INTRAVENOUS | Status: DC
  Administered 2013-09-02 – 2013-09-03 (×5): via INTRAVENOUS

## 2013-09-02 MED ORDER — NALBUPHINE SYRINGE 5 MG/0.5 ML
10.0000 mg | INJECTION | Freq: Four times a day (QID) | INTRAMUSCULAR | Status: DC | PRN
  Administered 2013-09-02: 10 mg via INTRAMUSCULAR
  Filled 2013-09-02: qty 1

## 2013-09-02 MED ORDER — ONDANSETRON HCL 4 MG/2ML IJ SOLN: 4.0000 mg | Freq: Four times a day (QID) | INTRAMUSCULAR | Status: DC | PRN

## 2013-09-02 NOTE — Progress Notes (Signed)
Kristine Perez is a 22 y.o. G1P0000 at [redacted]w[redacted]d by LMP admitted for rupture of membranes  Subjective:   Objective: BP 145/73  Pulse 105  Temp(Src) 98.9 F (37.2 C) (Oral)  Resp 20  Ht 5' 6.5" (1.689 m)  Wt 227 lb 4 oz (103.08 kg)  BMI 36.13 kg/m2  SpO2 100%  LMP 12/13/2012      FHT:  FHR: 130-140 bpm, variability: moderate,  accelerations:  Present,  decelerations:  Absent UC:   regular, every 2-3 minutes SVE:   Dilation: 7.5 Effacement (%): 90 Station: 0 Exam by:: m early rnc-ob  Labs: Lab Results  Component Value Date   WBC 6.4 09/02/2013   HGB 8.1* 09/02/2013   HCT 24.8* 09/02/2013   MCV 74.0* 09/02/2013   PLT 220 09/02/2013    Assessment / Plan: Augmentation of labor, progressing well  Labor: Progressing normally Preeclampsia:  n/a Fetal Wellbeing:  Category I Pain Control:  Epidural I/D:  n/a Anticipated MOD:  NSVD  Cameron Schwinn A 09/02/2013, 8:14 PM

## 2013-09-02 NOTE — Anesthesia Preprocedure Evaluation (Signed)
Anesthesia Evaluation  Patient identified by MRN, date of birth, ID band Patient awake    Reviewed: Allergy & Precautions, H&P , NPO status , Patient's Chart, lab work & pertinent test results  Airway Mallampati: II TM Distance: >3 FB Neck ROM: full    Dental no notable dental hx.    Pulmonary neg pulmonary ROS,    Pulmonary exam normal       Cardiovascular negative cardio ROS      Neuro/Psych negative psych ROS   GI/Hepatic Neg liver ROS,   Endo/Other  negative endocrine ROS  Renal/GU negative Renal ROS  negative genitourinary   Musculoskeletal negative musculoskeletal ROS (+)   Abdominal Normal abdominal exam  (+)   Peds  Hematology   Anesthesia Other Findings   Reproductive/Obstetrics (+) Pregnancy                           Anesthesia Physical Anesthesia Plan  ASA: II  Anesthesia Plan: Epidural   Post-op Pain Management:    Induction:   Airway Management Planned:   Additional Equipment:   Intra-op Plan:   Post-operative Plan:   Informed Consent: I have reviewed the patients History and Physical, chart, labs and discussed the procedure including the risks, benefits and alternatives for the proposed anesthesia with the patient or authorized representative who has indicated his/her understanding and acceptance.     Plan Discussed with:   Anesthesia Plan Comments:         Anesthesia Quick Evaluation

## 2013-09-02 NOTE — MAU Note (Signed)
Pt states had large gush of fluid around 0615 this am, has continued to have leaking on pad. Is having contractions q20 minutes. No BRB, does see brown blood.

## 2013-09-02 NOTE — H&P (Signed)
Kristine Perez is a 22 y.o. female presenting for SROM. Maternal Medical History:  Reason for admission: Rupture of membranes.  22 yo G1.  EDC 09-19-13.  Presents with c/o gush of fluid from vagina, clear.  Mild, irregular UC's.  Fetal activity: Perceived fetal activity is normal.   Last perceived fetal movement was within the past hour.    Prenatal complications: no prenatal complications Prenatal Complications - Diabetes: none.    OB History   Grav Para Term Preterm Abortions TAB SAB Ect Mult Living   1 0 0 0 0 0 0 0 0 0      Past Medical History  Diagnosis Date  . Headache(784.0)     migraines  . Anemia    Past Surgical History  Procedure Laterality Date  . Abscess      under arm  . No past surgeries     Family History: family history includes Cancer in her paternal grandmother; Diabetes in her maternal grandmother and paternal grandmother; Heart disease in her maternal grandmother; Hypertension in her mother and paternal grandmother; Mental illness in her mother. Social History:  reports that she has never smoked. She has never used smokeless tobacco. She reports that she does not drink alcohol or use illicit drugs.   Prenatal Transfer Tool  Maternal Diabetes: No Genetic Screening: Normal Maternal Ultrasounds/Referrals: Normal Fetal Ultrasounds or other Referrals:  None Maternal Substance Abuse:  No Significant Maternal Medications:  None Significant Maternal Lab Results:  None Other Comments:  None  Review of Systems  All other systems reviewed and are negative.    Dilation: 4 Effacement (%): 90 Station: -1 Exam by:: k fields, rn Blood pressure 129/77, pulse 96, temperature 98.4 F (36.9 C), temperature source Oral, resp. rate 20, height 5' 6.5" (1.689 m), weight 227 lb 4 oz (103.08 kg), last menstrual period 12/13/2012. Maternal Exam:  Uterine Assessment: Contraction strength is mild.  Contraction frequency is irregular.   Abdomen: Patient reports no  abdominal tenderness. Fetal presentation: vertex  Introitus: Normal vulva. Normal vagina.  Amniotic fluid character: clear.  Pelvis: adequate for delivery.   Cervix: Cervix evaluated by digital exam.     Physical Exam  Nursing note and vitals reviewed. Constitutional: She is oriented to person, place, and time. She appears well-developed and well-nourished.  HENT:  Head: Normocephalic and atraumatic.  Eyes: Conjunctivae are normal. Pupils are equal, round, and reactive to light.  Neck: Normal range of motion. Neck supple.  Cardiovascular: Normal rate and regular rhythm.   Respiratory: Effort normal.  GI: Soft.  Genitourinary: Vagina normal and uterus normal.  Musculoskeletal: Normal range of motion.  Neurological: She is alert and oriented to person, place, and time.  Skin: Skin is warm and dry.  Psychiatric: She has a normal mood and affect. Her behavior is normal. Judgment and thought content normal.    Prenatal labs: ABO, Rh: O/POS/-- (06/17 0957) Antibody: NEG (06/17 0957) Rubella: 2.02 (06/17 0957) RPR: NON REAC (08/25 1011)  HBsAg: NEGATIVE (06/17 0957)  HIV: NON REACTIVE (08/25 1011)  GBS: NEGATIVE (11/17 1617)   Assessment/Plan: 37.4 weeks.  PROM.  Admit.  IOL.   HARPER,CHARLES A 09/02/2013, 12:27 PM

## 2013-09-02 NOTE — Progress Notes (Signed)
Dr Clearance Coots notified of pt's VE, FHR pattern, and ROM status, orders received to admit pt.

## 2013-09-02 NOTE — Anesthesia Procedure Notes (Addendum)
Epidural Patient location during procedure: OB Start time: 09/02/2013 3:21 PM End time: 09/02/2013 3:25 PM  Staffing Anesthesiologist: Leilani Able Performed by: anesthesiologist   Preanesthetic Checklist Completed: patient identified, surgical consent, pre-op evaluation, timeout performed, IV checked, risks and benefits discussed and monitors and equipment checked  Epidural Patient position: sitting Prep: site prepped and draped and DuraPrep Patient monitoring: continuous pulse ox and blood pressure Approach: midline Injection technique: LOR air  Needle:  Needle type: Tuohy  Needle gauge: 17 G Needle length: 9 cm and 9 Needle insertion depth: 7 cm Catheter type: closed end flexible Catheter size: 19 Gauge Catheter at skin depth: 12 cm Test dose: negative and Other  Assessment Sensory level: T9 Events: blood not aspirated, injection not painful, no injection resistance, negative IV test and no paresthesia  Additional Notes Reason for block:procedure for pain  Epidural Patient location during procedure: OB Start time: 09/03/2013 9:12 AM End time: 09/03/2013 9:15 AM  Staffing Anesthesiologist: Leilani Able Performed by: anesthesiologist   Preanesthetic Checklist Completed: patient identified, surgical consent, pre-op evaluation, timeout performed, IV checked, risks and benefits discussed and monitors and equipment checked  Epidural Patient position: sitting Prep: site prepped and draped and DuraPrep Patient monitoring: continuous pulse ox and blood pressure Approach: midline Injection technique: LOR air  Needle:  Needle type: Tuohy  Needle gauge: 17 G Needle length: 9 cm and 9 Needle insertion depth: 8 cm Catheter type: closed end flexible Catheter size: 19 Gauge Catheter at skin depth: 14 cm Test dose: negative and 2% lidocaine with Epi 1:200 K  Assessment Sensory level: T6 Events: blood not aspirated, injection not painful, no injection  resistance, negative IV test and no paresthesia  Additional Notes Reason for block:surgical anesthesia

## 2013-09-02 NOTE — Progress Notes (Signed)
Suzetta L Gieske is a 22 y.o. G1P0000 at [redacted]w[redacted]d by LMP admitted for rupture of membranes  Subjective:   Objective: BP 142/75  Pulse 96  Temp(Src) 98.4 F (36.9 C) (Oral)  Resp 20  Ht 5' 6.5" (1.689 m)  Wt 227 lb 4 oz (103.08 kg)  BMI 36.13 kg/m2  LMP 12/13/2012      FHT:  FHR: 130-140 bpm, variability: moderate,  accelerations:  Present,  decelerations:  Absent UC:   regular, every 2-3 minutes SVE:   Dilation: 4 Effacement (%): 90 Station: -1 Exam by:: k fields, rn  Labs: Lab Results  Component Value Date   WBC 6.4 09/02/2013   HGB 8.1* 09/02/2013   HCT 24.8* 09/02/2013   MCV 74.0* 09/02/2013   PLT 220 09/02/2013    Assessment / Plan: Augmentation of labor, progressing well  Labor: Progressing normally Preeclampsia:  n/a Fetal Wellbeing:  Category I Pain Control:  Nubain I/D:  n/a Anticipated MOD:  NSVD  Cheronda Erck A 09/02/2013, 12:33 PM

## 2013-09-03 ENCOUNTER — Encounter (HOSPITAL_COMMUNITY)
Admission: AD | Disposition: A | Discharge status: Home / Self Care | Payer: Self-pay | Source: Ambulatory Visit | Attending: Obstetrics & Gynecology

## 2013-09-03 ENCOUNTER — Encounter (HOSPITAL_COMMUNITY): Payer: Self-pay | Admitting: Anesthesiology

## 2013-09-03 ENCOUNTER — Encounter: Payer: Medicaid Other | Admitting: Obstetrics

## 2013-09-03 LAB — PREPARE RBC (CROSSMATCH)

## 2013-09-03 SURGERY — Surgical Case
Anesthesia: Epidural | Site: Abdomen

## 2013-09-03 MED ORDER — ZOLPIDEM TARTRATE 5 MG PO TABS: 5.0000 mg | ORAL_TABLET | Freq: Every evening | ORAL | Status: DC | PRN

## 2013-09-03 MED ORDER — OXYTOCIN 10 UNIT/ML IJ SOLN
40.0000 [IU] | INTRAVENOUS | Status: DC | PRN
  Administered 2013-09-03: 40 [IU] via INTRAVENOUS

## 2013-09-03 MED ORDER — PROMETHAZINE HCL 25 MG/ML IJ SOLN: 6.2500 mg | INTRAMUSCULAR | Status: DC | PRN

## 2013-09-03 MED ORDER — CHLOROPROCAINE HCL 3 % IJ SOLN
INTRAMUSCULAR | Status: AC
  Filled 2013-09-03: qty 20

## 2013-09-03 MED ORDER — DIPHENHYDRAMINE HCL 50 MG/ML IJ SOLN: 25.0000 mg | INTRAMUSCULAR | Status: DC | PRN

## 2013-09-03 MED ORDER — PHENYLEPHRINE HCL 10 MG/ML IJ SOLN
INTRAMUSCULAR | Status: AC
  Filled 2013-09-03: qty 1

## 2013-09-03 MED ORDER — ONDANSETRON HCL 4 MG/2ML IJ SOLN
INTRAMUSCULAR | Status: AC
  Filled 2013-09-03: qty 2

## 2013-09-03 MED ORDER — NALBUPHINE HCL 10 MG/ML IJ SOLN
5.0000 mg | INTRAMUSCULAR | Status: DC | PRN
  Filled 2013-09-03: qty 1

## 2013-09-03 MED ORDER — NALOXONE HCL 1 MG/ML IJ SOLN
1.0000 ug/kg/h | INTRAMUSCULAR | Status: DC | PRN
  Filled 2013-09-03: qty 2

## 2013-09-03 MED ORDER — KETOROLAC TROMETHAMINE 30 MG/ML IJ SOLN
30.0000 mg | Freq: Four times a day (QID) | INTRAMUSCULAR | Status: DC | PRN
  Administered 2013-09-03: 30 mg via INTRAMUSCULAR

## 2013-09-03 MED ORDER — ACETAMINOPHEN 500 MG PO TABS: 1000.0000 mg | ORAL_TABLET | Freq: Four times a day (QID) | ORAL | Status: DC

## 2013-09-03 MED ORDER — ONDANSETRON HCL 4 MG/2ML IJ SOLN: 4.0000 mg | INTRAMUSCULAR | Status: DC | PRN

## 2013-09-03 MED ORDER — NALOXONE HCL 1 MG/ML IJ SOLN
1.0000 ug/kg/h | INTRAVENOUS | Status: DC | PRN
  Filled 2013-09-03: qty 2

## 2013-09-03 MED ORDER — OXYTOCIN 10 UNIT/ML IJ SOLN
INTRAMUSCULAR | Status: AC
  Filled 2013-09-03: qty 4

## 2013-09-03 MED ORDER — SCOPOLAMINE 1 MG/3DAYS TD PT72
1.0000 | MEDICATED_PATCH | Freq: Once | TRANSDERMAL | Status: DC
  Filled 2013-09-03: qty 1

## 2013-09-03 MED ORDER — BUPIVACAINE LIPOSOME 1.3 % IJ SUSP
20.0000 mL | Freq: Once | INTRAMUSCULAR | Status: DC
  Filled 2013-09-03: qty 20

## 2013-09-03 MED ORDER — SENNOSIDES-DOCUSATE SODIUM 8.6-50 MG PO TABS
2.0000 | ORAL_TABLET | ORAL | Status: DC
  Administered 2013-09-03 – 2013-09-05 (×3): 2 via ORAL
  Filled 2013-09-03 (×5): qty 2

## 2013-09-03 MED ORDER — SIMETHICONE 80 MG PO CHEW
80.0000 mg | CHEWABLE_TABLET | ORAL | Status: DC
  Administered 2013-09-05: 80 mg via ORAL
  Filled 2013-09-03 (×4): qty 1

## 2013-09-03 MED ORDER — TRANEXAMIC ACID 100 MG/ML IV SOLN
1000.0000 mg | INTRAVENOUS | Status: AC
  Administered 2013-09-03: 1000 mg via INTRAVENOUS
  Filled 2013-09-03: qty 10

## 2013-09-03 MED ORDER — DIPHENHYDRAMINE HCL 25 MG PO CAPS
25.0000 mg | ORAL_CAPSULE | Freq: Four times a day (QID) | ORAL | Status: DC | PRN
  Filled 2013-09-03: qty 1

## 2013-09-03 MED ORDER — ACETAMINOPHEN 500 MG PO TABS
1000.0000 mg | ORAL_TABLET | Freq: Four times a day (QID) | ORAL | Status: DC | PRN
  Administered 2013-09-03: 1000 mg via ORAL
  Filled 2013-09-03: qty 2

## 2013-09-03 MED ORDER — LACTATED RINGERS IV SOLN
INTRAVENOUS | Status: DC
  Administered 2013-09-03 – 2013-09-05 (×2): via INTRAVENOUS

## 2013-09-03 MED ORDER — PHENYLEPHRINE 8 MG IN D5W 100 ML (0.08MG/ML) PREMIX OPTIME
INJECTION | INTRAVENOUS | Status: DC | PRN
  Administered 2013-09-03: 50 ug/min via INTRAVENOUS

## 2013-09-03 MED ORDER — DIBUCAINE 1 % RE OINT
1.0000 "application " | TOPICAL_OINTMENT | RECTAL | Status: DC | PRN
  Filled 2013-09-03: qty 28

## 2013-09-03 MED ORDER — FENTANYL CITRATE 0.05 MG/ML IJ SOLN
INTRAMUSCULAR | Status: AC
  Administered 2013-09-03: 50 ug
  Filled 2013-09-03: qty 2

## 2013-09-03 MED ORDER — DIPHENHYDRAMINE HCL 25 MG PO CAPS: 25.0000 mg | ORAL_CAPSULE | ORAL | Status: DC | PRN

## 2013-09-03 MED ORDER — LANOLIN HYDROUS EX OINT: 1.0000 "application " | TOPICAL_OINTMENT | CUTANEOUS | Status: DC | PRN

## 2013-09-03 MED ORDER — SCOPOLAMINE 1 MG/3DAYS TD PT72: 1.0000 | MEDICATED_PATCH | Freq: Once | TRANSDERMAL | Status: DC

## 2013-09-03 MED ORDER — CEFAZOLIN SODIUM-DEXTROSE 2-3 GM-% IV SOLR
INTRAVENOUS | Status: AC
  Filled 2013-09-03: qty 50

## 2013-09-03 MED ORDER — ONDANSETRON HCL 4 MG/2ML IJ SOLN: 4.0000 mg | Freq: Three times a day (TID) | INTRAMUSCULAR | Status: DC | PRN

## 2013-09-03 MED ORDER — SODIUM BICARBONATE 8.4 % IV SOLN
INTRAVENOUS | Status: AC
  Filled 2013-09-03: qty 50

## 2013-09-03 MED ORDER — MAGNESIUM HYDROXIDE 400 MG/5ML PO SUSP
30.0000 mL | ORAL | Status: DC | PRN
  Filled 2013-09-03: qty 30

## 2013-09-03 MED ORDER — LACTATED RINGERS IV SOLN
INTRAVENOUS | Status: DC | PRN
  Administered 2013-09-03: 10:00:00 via INTRAVENOUS

## 2013-09-03 MED ORDER — DIPHENHYDRAMINE HCL 50 MG/ML IJ SOLN: 12.5000 mg | INTRAMUSCULAR | Status: DC | PRN

## 2013-09-03 MED ORDER — OXYTOCIN 40 UNITS IN LACTATED RINGERS INFUSION - SIMPLE MED: 62.5000 mL/h | INTRAVENOUS | Status: AC

## 2013-09-03 MED ORDER — MEPERIDINE HCL 25 MG/ML IJ SOLN: 6.2500 mg | INTRAMUSCULAR | Status: DC | PRN

## 2013-09-03 MED ORDER — CEFAZOLIN SODIUM-DEXTROSE 2-3 GM-% IV SOLR: 2.0000 g | INTRAVENOUS | Status: DC

## 2013-09-03 MED ORDER — BUPIVACAINE HCL (PF) 0.25 % IJ SOLN
INTRAMUSCULAR | Status: DC | PRN
  Administered 2013-09-03: 6 mL

## 2013-09-03 MED ORDER — SIMETHICONE 80 MG PO CHEW
80.0000 mg | CHEWABLE_TABLET | ORAL | Status: DC | PRN
  Administered 2013-09-03 – 2013-09-05 (×3): 80 mg via ORAL
  Filled 2013-09-03 (×3): qty 1

## 2013-09-03 MED ORDER — KETOROLAC TROMETHAMINE 30 MG/ML IJ SOLN: 30.0000 mg | Freq: Four times a day (QID) | INTRAMUSCULAR | Status: AC | PRN

## 2013-09-03 MED ORDER — METOCLOPRAMIDE HCL 5 MG/ML IJ SOLN: 10.0000 mg | Freq: Three times a day (TID) | INTRAMUSCULAR | Status: DC | PRN

## 2013-09-03 MED ORDER — KETOROLAC TROMETHAMINE 30 MG/ML IJ SOLN
30.0000 mg | Freq: Four times a day (QID) | INTRAMUSCULAR | Status: AC | PRN
  Administered 2013-09-03 (×2): 30 mg via INTRAVENOUS
  Filled 2013-09-03 (×2): qty 1

## 2013-09-03 MED ORDER — DEXTROSE 5 % IV SOLN
500.0000 mg | Freq: Once | INTRAVENOUS | Status: DC
  Filled 2013-09-03: qty 500

## 2013-09-03 MED ORDER — ONDANSETRON HCL 4 MG PO TABS: 4.0000 mg | ORAL_TABLET | ORAL | Status: DC | PRN

## 2013-09-03 MED ORDER — SODIUM BICARBONATE 8.4 % IV SOLN
INTRAVENOUS | Status: DC | PRN
  Administered 2013-09-03 (×2): 5 mL via EPIDURAL

## 2013-09-03 MED ORDER — SODIUM CHLORIDE 0.9 % IJ SOLN: Freq: Once | INTRAMUSCULAR | Status: DC

## 2013-09-03 MED ORDER — NALOXONE HCL 0.4 MG/ML IJ SOLN: 0.4000 mg | INTRAMUSCULAR | Status: DC | PRN

## 2013-09-03 MED ORDER — FENTANYL CITRATE 0.05 MG/ML IJ SOLN
25.0000 ug | INTRAMUSCULAR | Status: DC | PRN
  Administered 2013-09-03: 50 ug via INTRAVENOUS

## 2013-09-03 MED ORDER — MEPERIDINE HCL 25 MG/ML IJ SOLN
INTRAMUSCULAR | Status: AC
  Filled 2013-09-03: qty 1

## 2013-09-03 MED ORDER — PRENATAL MULTIVITAMIN CH
1.0000 | ORAL_TABLET | Freq: Every day | ORAL | Status: DC
  Administered 2013-09-04 – 2013-09-05 (×2): 1 via ORAL
  Filled 2013-09-03 (×4): qty 1

## 2013-09-03 MED ORDER — OXYCODONE-ACETAMINOPHEN 5-325 MG PO TABS
1.0000 | ORAL_TABLET | ORAL | Status: DC | PRN
  Administered 2013-09-04 – 2013-09-05 (×4): 1 via ORAL
  Administered 2013-09-05 – 2013-09-06 (×2): 2 via ORAL
  Filled 2013-09-03 (×2): qty 1
  Filled 2013-09-03: qty 2
  Filled 2013-09-03 (×2): qty 1
  Filled 2013-09-03: qty 2

## 2013-09-03 MED ORDER — MORPHINE SULFATE 0.5 MG/ML IJ SOLN
INTRAMUSCULAR | Status: AC
  Filled 2013-09-03: qty 10

## 2013-09-03 MED ORDER — CEFAZOLIN SODIUM-DEXTROSE 2-3 GM-% IV SOLR
INTRAVENOUS | Status: DC | PRN
  Administered 2013-09-03: 2 g via INTRAVENOUS

## 2013-09-03 MED ORDER — FERROUS SULFATE 325 (65 FE) MG PO TABS
325.0000 mg | ORAL_TABLET | Freq: Two times a day (BID) | ORAL | Status: DC
  Administered 2013-09-03 – 2013-09-06 (×6): 325 mg via ORAL
  Filled 2013-09-03 (×9): qty 1

## 2013-09-03 MED ORDER — MIDAZOLAM HCL 2 MG/2ML IJ SOLN: 0.5000 mg | Freq: Once | INTRAMUSCULAR | Status: DC | PRN

## 2013-09-03 MED ORDER — KETOROLAC TROMETHAMINE 30 MG/ML IJ SOLN: 30.0000 mg | Freq: Four times a day (QID) | INTRAMUSCULAR | Status: DC | PRN

## 2013-09-03 MED ORDER — KETOROLAC TROMETHAMINE 30 MG/ML IJ SOLN
INTRAMUSCULAR | Status: AC
  Administered 2013-09-03: 30 mg via INTRAMUSCULAR
  Filled 2013-09-03: qty 1

## 2013-09-03 MED ORDER — MEPERIDINE HCL 25 MG/ML IJ SOLN
INTRAMUSCULAR | Status: DC | PRN
  Administered 2013-09-03: 25 mg via INTRAVENOUS

## 2013-09-03 MED ORDER — FENTANYL CITRATE 0.05 MG/ML IJ SOLN
INTRAMUSCULAR | Status: DC | PRN
  Administered 2013-09-03 (×2): 50 ug via EPIDURAL

## 2013-09-03 MED ORDER — FENTANYL CITRATE 0.05 MG/ML IJ SOLN
INTRAMUSCULAR | Status: AC
  Filled 2013-09-03: qty 2

## 2013-09-03 MED ORDER — MEASLES, MUMPS & RUBELLA VAC ~~LOC~~ INJ
0.5000 mL | INJECTION | Freq: Once | SUBCUTANEOUS | Status: DC
  Filled 2013-09-03: qty 0.5

## 2013-09-03 MED ORDER — TETANUS-DIPHTH-ACELL PERTUSSIS 5-2.5-18.5 LF-MCG/0.5 IM SUSP
0.5000 mL | Freq: Once | INTRAMUSCULAR | Status: DC
  Filled 2013-09-03: qty 0.5

## 2013-09-03 MED ORDER — LIDOCAINE-EPINEPHRINE (PF) 2 %-1:200000 IJ SOLN
INTRAMUSCULAR | Status: AC
  Filled 2013-09-03: qty 20

## 2013-09-03 MED ORDER — IBUPROFEN 600 MG PO TABS
600.0000 mg | ORAL_TABLET | Freq: Four times a day (QID) | ORAL | Status: DC | PRN
  Filled 2013-09-03 (×2): qty 1

## 2013-09-03 MED ORDER — IBUPROFEN 600 MG PO TABS: 600.0000 mg | ORAL_TABLET | Freq: Four times a day (QID) | ORAL | Status: DC | PRN

## 2013-09-03 MED ORDER — IBUPROFEN 600 MG PO TABS
600.0000 mg | ORAL_TABLET | Freq: Four times a day (QID) | ORAL | Status: DC
  Administered 2013-09-04 – 2013-09-06 (×9): 600 mg via ORAL
  Filled 2013-09-03 (×10): qty 1

## 2013-09-03 MED ORDER — CEFAZOLIN SODIUM-DEXTROSE 2-3 GM-% IV SOLR: 2.0000 g | Freq: Once | INTRAVENOUS | Status: DC

## 2013-09-03 MED ORDER — MORPHINE SULFATE (PF) 0.5 MG/ML IJ SOLN
INTRAMUSCULAR | Status: DC | PRN
  Administered 2013-09-03: 2 mg via INTRAVENOUS
  Administered 2013-09-03: 3 mg via EPIDURAL

## 2013-09-03 MED ORDER — DIPHENHYDRAMINE HCL 25 MG PO CAPS
25.0000 mg | ORAL_CAPSULE | ORAL | Status: DC | PRN
  Filled 2013-09-03: qty 1

## 2013-09-03 MED ORDER — WITCH HAZEL-GLYCERIN EX PADS: 1.0000 "application " | MEDICATED_PAD | CUTANEOUS | Status: DC | PRN

## 2013-09-03 MED ORDER — ONDANSETRON HCL 4 MG/2ML IJ SOLN
INTRAMUSCULAR | Status: DC | PRN
  Administered 2013-09-03: 4 mg via INTRAVENOUS

## 2013-09-03 MED ORDER — SODIUM CHLORIDE 0.9 % IJ SOLN: 3.0000 mL | INTRAMUSCULAR | Status: DC | PRN

## 2013-09-03 MED ORDER — SCOPOLAMINE 1 MG/3DAYS TD PT72
MEDICATED_PATCH | TRANSDERMAL | Status: AC
  Administered 2013-09-03: 1.5 mg
  Filled 2013-09-03: qty 1

## 2013-09-03 SURGICAL SUPPLY — 43 items
BENZOIN TINCTURE PRP APPL 2/3 (GAUZE/BANDAGES/DRESSINGS) ×2 IMPLANT
CANISTER WOUND CARE 500ML ATS (WOUND CARE) IMPLANT
CLAMP CORD UMBIL (MISCELLANEOUS) IMPLANT
CLOTH BEACON ORANGE TIMEOUT ST (SAFETY) ×2 IMPLANT
CONTAINER PREFILL 10% NBF 15ML (MISCELLANEOUS) IMPLANT
DRAPE LG THREE QUARTER DISP (DRAPES) ×2 IMPLANT
DRSG OPSITE POSTOP 4X10 (GAUZE/BANDAGES/DRESSINGS) ×2 IMPLANT
DRSG VAC ATS LRG SENSATRAC (GAUZE/BANDAGES/DRESSINGS) IMPLANT
DRSG VAC ATS MED SENSATRAC (GAUZE/BANDAGES/DRESSINGS) IMPLANT
DRSG VAC ATS SM SENSATRAC (GAUZE/BANDAGES/DRESSINGS) IMPLANT
DURAPREP 26ML APPLICATOR (WOUND CARE) ×2 IMPLANT
ELECT REM PT RETURN 9FT ADLT (ELECTROSURGICAL) ×2
ELECTRODE REM PT RTRN 9FT ADLT (ELECTROSURGICAL) ×1 IMPLANT
EXTRACTOR VACUUM M CUP 4 TUBE (SUCTIONS) IMPLANT
GLOVE BIO SURGEON STRL SZ 6.5 (GLOVE) ×2 IMPLANT
GOWN PREVENTION PLUS XLARGE (GOWN DISPOSABLE) ×4 IMPLANT
GOWN STRL REIN XL XLG (GOWN DISPOSABLE) ×4 IMPLANT
KIT ABG SYR 3ML LUER SLIP (SYRINGE) IMPLANT
NEEDLE HYPO 25X5/8 SAFETYGLIDE (NEEDLE) ×2 IMPLANT
NS IRRIG 1000ML POUR BTL (IV SOLUTION) ×2 IMPLANT
PACK C SECTION WH (CUSTOM PROCEDURE TRAY) ×2 IMPLANT
PAD ABD 7.5X8 STRL (GAUZE/BANDAGES/DRESSINGS) ×2 IMPLANT
PAD OB MATERNITY 4.3X12.25 (PERSONAL CARE ITEMS) ×2 IMPLANT
RTRCTR C-SECT PINK 25CM LRG (MISCELLANEOUS) ×2 IMPLANT
SCRUB PCMX 4 OZ (MISCELLANEOUS) ×2 IMPLANT
STAPLER VISISTAT 35W (STAPLE) IMPLANT
STRIP CLOSURE SKIN 1/2X4 (GAUZE/BANDAGES/DRESSINGS) ×2 IMPLANT
STRIP CLOSURE SKIN 1/4X3 (GAUZE/BANDAGES/DRESSINGS) ×2 IMPLANT
SUT MNCRL 0 VIOLET CTX 36 (SUTURE) ×3 IMPLANT
SUT MNCRL AB 3-0 PS2 27 (SUTURE) ×2 IMPLANT
SUT MONOCRYL 0 CTX 36 (SUTURE) ×3
SUT PDS AB 0 CTX 36 PDP370T (SUTURE) ×2 IMPLANT
SUT PLAIN 0 NONE (SUTURE) IMPLANT
SUT VIC AB 0 CTXB 36 (SUTURE) ×4 IMPLANT
SUT VIC AB 2-0 CT1 (SUTURE) ×2 IMPLANT
SUT VIC AB 2-0 CT1 27 (SUTURE) ×1
SUT VIC AB 2-0 CT1 TAPERPNT 27 (SUTURE) ×1 IMPLANT
SUT VIC AB 2-0 SH 27 (SUTURE) ×1
SUT VIC AB 2-0 SH 27XBRD (SUTURE) ×1 IMPLANT
TAPE CLOTH SURG 4X10 WHT LF (GAUZE/BANDAGES/DRESSINGS) ×2 IMPLANT
TOWEL OR 17X24 6PK STRL BLUE (TOWEL DISPOSABLE) ×2 IMPLANT
TRAY FOLEY CATH 14FR (SET/KITS/TRAYS/PACK) ×2 IMPLANT
WATER STERILE IRR 1000ML POUR (IV SOLUTION) ×2 IMPLANT

## 2013-09-03 NOTE — Op Note (Signed)
Cesarean Section Procedure Note   Kristine Perez   09/02/2013 - 09/03/2013  Indications: Arrest of descent, persistent occiput posterior   Pre-operative Diagnosis: Arrest of descent, persistent occiput posterior  Post-operative Diagnosis: Same   Surgeon: Antionette Char A  Assistants: none  Anesthesia: epidural  Procedure Details:  The patient was seen in the Holding Room. The risks, benefits, complications, treatment options, and expected outcomes were discussed with the patient. The patient concurred with the proposed plan, giving informed consent. The patient was identified as [name] and the procedure verified as C-Section Delivery. A Time Out was held and the above information confirmed.  After induction of anesthesia, the patient was draped and prepped in the usual sterile manner. A transverse incision was made and carried down through the subcutaneous tissue to the fascia. The fascial incision was made and extended transversely. The fascia was separated from the underlying rectus tissue superiorly. The peritoneum was identified and entered. The peritoneal incision was extended longitudinally. An Alexis retractor was placed into the incision.  The utero-vesical peritoneal reflection was incised transversely and the bladder flap was bluntly freed from the lower uterine segment. A low transverse uterine incision was made. Delivered from cephalic presentation was a living newborn female infant. APGAR (1 MIN): 9   APGAR (5 MINS): 9       A cord ph was not sent. The umbilical cord was clamped and cut cord. A sample was obtained for evaluation. The placenta was removed Intact and appeared normal.  The uterine incision was closed with running locked sutures of 1-0 Monocryl. A second imbricating layer of the same suture was placed.  Hemostasis was observed. The paracolic gutters were irrigated. The parieto peritoneum was closed in a running fashion with 2-0 Vicryl.  The fascia  was then reapproximated with running sutures of 0 Vicryl.  The skin was closed with suture.  Instrument, sponge, and needle counts were correct prior the abdominal closure and were correct at the conclusion of the case.    Findings:  See above   Estimated Blood Loss: 400 ml  Total IV Fluids: per Anesthesiology  Urine Output: per Anesthesiology, slightly blood-tinged at the start of the procedure  Specimens: none  Complications: no complications  Disposition: PACU - hemodynamically stable.  Maternal Condition: stable   Baby condition / location:  Couplet care / Skin to Skin    Signed: Surgeon(s): Antionette Char, MD

## 2013-09-03 NOTE — Anesthesia Postprocedure Evaluation (Signed)
Anesthesia Post Note  Patient: Kristine Perez  Procedure(s) Performed: Procedure(s) (LRB): Primary CESAREAN SECTION with delivery of baby girl @ (N/A)  Anesthesia type: Epidural  Patient location: PACU  Post pain: Pain level controlled  Post assessment: Post-op Vital signs reviewed  Last Vitals:  Filed Vitals:   09/03/13 1102  BP: 112/72  Pulse: 110  Temp:   Resp: 26    Post vital signs: Reviewed  Level of consciousness: awake  Complications: No apparent anesthesia complications

## 2013-09-03 NOTE — Lactation Note (Signed)
This note was copied from the chart of Kristine [place] Desanctis. Lactation Consultation Note Initial visit at 8 hours of age, per mom's request.  Mom is concerned that the baby is hungry and only latched once.  Mom has hand pumped about of colostrum.  Right nipple flat and compressible, left nipple large tough areola non compressible. Colostrum easily expressed with hand expression demonstration.  Baby asleep not cuing.  Encouraged baby to suck gloved finger.  She does not have a well coordinated suck.  Mom concerned about baby being hungry so attempted to syringe feed baby 4 mls of expressed colostrum.  Baby mouthed and spit out some colostrum. Encouraged mom to feed with cues, skin to skin.  Mom may need to hand pump to help get breast more compressible.  Assured mom baby is not ready to eat yet.  Discussed cluster feeding and referred to baby and me booklet for written information about breastfeeding.  [facility] LC resources given and discussed.  Mom to call for assist as needed.  Reports given to [facility] RN.   Patient Name: Kristine Perez Today's Date: 09/03/2013 Reason for consult: Initial assessment   Maternal Data Formula Feeding for Exclusion: No Has patient been taught Hand Expression?: Yes Does the patient have breastfeeding experience prior to this delivery?: No  Feeding    LATCH Score/Interventions                      Lactation Tools Discussed/Used Tools:  (syringe)   Consult Status Consult Status: Follow-up Date: 09/04/13 Follow-up type: In-patient    Jannifer Rodney 09/03/2013, 8:01 PM

## 2013-09-03 NOTE — Progress Notes (Signed)
Pt vomitted lg amt clear fluid

## 2013-09-03 NOTE — Progress Notes (Signed)
Kristine Perez is a 22 y.o. G1P0000 at [redacted]w[redacted]d by LMP admitted for rupture of membranes  Subjective:   Objective: BP 126/63  Pulse 109  Temp(Src) 99.6 F (37.6 C) (Oral)  Resp 20  Ht 5' 6.5" (1.689 m)  Wt 227 lb 4 oz (103.08 kg)  BMI 36.13 kg/m2  SpO2 98%  LMP 12/13/2012      FHT:  FHR: 150-160 bpm, variability: moderate,  accelerations:  Present,  decelerations:  Absent UC:   regular, every 2-3 minutes SVE:   Dilation: 9 Effacement (%): 90 Station: +1 Exam by:: JDaley  Labs: Lab Results  Component Value Date   WBC 6.4 09/02/2013   HGB 8.1* 09/02/2013   HCT 24.8* 09/02/2013   MCV 74.0* 09/02/2013   PLT 220 09/02/2013    Assessment / Plan: 37 weeks.  PROM.  OP position.  Will institute position changes to encourage rotation of vertex.  Labor: Occiput Posterior Position. Slow progress. Preeclampsia:  n/a Fetal Wellbeing:  Category I Pain Control:  Epidural I/D:  n/a Anticipated MOD:  NSVD  Shanna Strength A 09/03/2013, 2:58 AM

## 2013-09-03 NOTE — Progress Notes (Signed)
Kristine Perez is a 22 y.o. G1P0000 at [redacted]w[redacted]d by LMP admitted for rupture of membranes  Subjective:   Objective: BP 156/87  Pulse 109  Temp(Src) 99.4 F (37.4 C) (Oral)  Resp 20  Ht 5' 6.5" (1.689 m)  Wt 227 lb 4 oz (103.08 kg)  BMI 36.13 kg/m2  SpO2 100%  LMP 12/13/2012      FHT:  FHR: 150-160 bpm, variability: moderate,  accelerations:  Present,  decelerations:  Absent UC:   regular, every 2-3 minutes SVE:   Dilation: 10 Effacement (%): 100 Station: +1 Exam by:: JDaley  Labs: Lab Results  Component Value Date   WBC 6.4 09/02/2013   HGB 8.1* 09/02/2013   HCT 24.8* 09/02/2013   MCV 74.0* 09/02/2013   PLT 220 09/02/2013    Assessment / Plan: Induction of labor due to PROM,  progressing well on pitocin  Labor: Progressing normally.  OP position. Preeclampsia:  n/a Fetal Wellbeing:  Category I Pain Control:  Epidural I/D:  n/a Anticipated MOD:  NSVD  Kipling Graser A 09/03/2013, 7:03 AM

## 2013-09-03 NOTE — Transfer of Care (Signed)
Immediate Anesthesia Transfer of Care Note  Patient: Kristine Perez  Procedure(s) Performed: Procedure(s) with comments: Primary CESAREAN SECTION with delivery of baby girl @ (N/A) - wound class clean-contaminated  Patient Location: PACU  Anesthesia Type:Epidural  Level of Consciousness: awake, alert , oriented and patient cooperative  Airway & Oxygen Therapy: Patient Spontanous Breathing  Post-op Assessment: Post -op Vital signs reviewed and stable  Post vital signs: stable  Complications: No apparent anesthesia complications

## 2013-09-03 NOTE — Progress Notes (Signed)
Per patient-Bearing down improves pts back pain

## 2013-09-03 NOTE — Progress Notes (Signed)
Pt is 10cm, doesn't want to have additional epidural dosing, pt and this nurse discussed pt resting and and bearing down during contractions until she feels an urge to push.

## 2013-09-03 NOTE — Progress Notes (Signed)
Patient ID: Kristine Perez, female   DOB: [birth year], 22 y.o.   MRN: [id number] Kristine Perez is a 22 y.o. G1P0000 at [redacted]w[redacted]d by LMP admitted for rupture of membranes  Subjective: C/O UCs  Objective: BP 141/93  Pulse 102  Temp(Src) 99.8 F (37.7 C) (Oral)  Resp 18  Ht 5' 6.5" (1.689 m)  Wt 103.08 kg (227 lb 4 oz)  BMI 36.13 kg/m2  SpO2 99%  LMP 12/13/2012      FHT:  FHR: 150-160 bpm, variability: moderate,  accelerations:  Present,  decelerations:  Absent UC:   regular, every 2-3 minutes SVE:   Dilation: 10 Effacement (%): 100 Station: +1 Exam by:: JDaley Prominent spines   Assessment / Plan: Arrest of descent, likely persistent OP  Labor: See above Preeclampsia:  Borderline B/P elevations Fetal Wellbeing:  Category I Pain Control:  Epidural I/D:  n/a Anticipated MOD:  NSVD and C/D; informed consent obtained  JACKSON-MOORE,Samya Siciliano A 09/03/2013, 8:51 AM

## 2013-09-04 ENCOUNTER — Encounter (HOSPITAL_COMMUNITY): Payer: Self-pay | Admitting: Obstetrics & Gynecology

## 2013-09-04 LAB — URINALYSIS, ROUTINE W REFLEX MICROSCOPIC
Ketones, ur: NEGATIVE mg/dL
Nitrite: NEGATIVE
Specific Gravity, Urine: 1.015 (ref 1.005–1.030)
pH: 6.5 (ref 5.0–8.0)

## 2013-09-04 LAB — LACTATE DEHYDROGENASE: LDH: 291 U/L — ABNORMAL HIGH (ref 94–250)

## 2013-09-04 LAB — CBC WITH DIFFERENTIAL/PLATELET
Basophils Absolute: 0.1 10*3/uL (ref 0.0–0.1)
Eosinophils Relative: 1 % (ref 0–5)
HCT: 20.9 % — ABNORMAL LOW (ref 36.0–46.0)
Lymphocytes Relative: 29 % (ref 12–46)
Lymphs Abs: 2.5 10*3/uL (ref 0.7–4.0)
MCH: 24.5 pg — ABNORMAL LOW (ref 26.0–34.0)
Monocytes Absolute: 0.6 10*3/uL (ref 0.1–1.0)
Monocytes Relative: 6 % (ref 3–12)
Neutro Abs: 5.5 10*3/uL (ref 1.7–7.7)
Platelets: 192 10*3/uL (ref 150–400)
RDW: 14 % (ref 11.5–15.5)
WBC: 8.6 10*3/uL (ref 4.0–10.5)

## 2013-09-04 LAB — CBC
HCT: 22.4 % — ABNORMAL LOW (ref 36.0–46.0)
MCH: 24.1 pg — ABNORMAL LOW (ref 26.0–34.0)
MCHC: 32.6 g/dL (ref 30.0–36.0)
MCV: 73.9 fL — ABNORMAL LOW (ref 78.0–100.0)
RDW: 14.2 % (ref 11.5–15.5)
WBC: 9.6 10*3/uL (ref 4.0–10.5)

## 2013-09-04 LAB — COMPREHENSIVE METABOLIC PANEL
Albumin: 2 g/dL — ABNORMAL LOW (ref 3.5–5.2)
BUN: 6 mg/dL (ref 6–23)
Calcium: 7.9 mg/dL — ABNORMAL LOW (ref 8.4–10.5)
Chloride: 104 mEq/L (ref 96–112)
Creatinine, Ser: 0.82 mg/dL (ref 0.50–1.10)
Glucose, Bld: 79 mg/dL (ref 70–99)
Potassium: 3.3 mEq/L — ABNORMAL LOW (ref 3.5–5.1)
Total Bilirubin: 0.2 mg/dL — ABNORMAL LOW (ref 0.3–1.2)
Total Protein: 4.9 g/dL — ABNORMAL LOW (ref 6.0–8.3)

## 2013-09-04 LAB — URINE MICROSCOPIC-ADD ON

## 2013-09-04 MED ORDER — LACTATED RINGERS IV BOLUS (SEPSIS)
500.0000 mL | Freq: Once | INTRAVENOUS | Status: AC
  Administered 2013-09-04: 500 mL via INTRAVENOUS

## 2013-09-04 NOTE — Lactation Note (Signed)
This note was copied from the chart of Girl Marne Desanctis. Lactation Consultation Note  Patient Name: Girl Graciemae Delisle Today's Date: 09/04/2013   [facility] reviewed baby's feeding and output record and discussed with RN, Beth.  Per RN, mom is now febrile and feeling too ill to try breastfeeding but has been pumping and obtained 10 ml's and most recently 5 ml's of ebm which she is feeding to baby by syringe or bottle (mom;s choice).  RN will encourage regular pumping if mom's condition allows and recommends LC follow-up tomorrow.  Maternal Data    Feeding Feeding Type: Breast Fed Length of feed: 0 min (mother refused to latch)  LATCH Score/Interventions            N/A - mom pumping and bottle or syringe-feeding          Lactation Tools Discussed/Used   N/A - LC visit deferred  Consult Status   LC follow-up tomorrow   Lynda Rainwater 09/04/2013, 9:05 PM

## 2013-09-04 NOTE — Progress Notes (Signed)
Dr. Clearance Coots called re Pt VS elevations done at 1900, call made at 1930, cbc w/ diff urine culture urine analysis done and 500cc bolus or LR with running fluids after of 125 ml /hr. Will call him back with results.

## 2013-09-04 NOTE — Progress Notes (Signed)
Subjective: Postpartum Day 1: Cesarean Delivery Patient reports tolerating PO.    Objective: Vital signs in last 24 hours: Temp:  [97.4 F (36.3 C)-99.9 F (37.7 C)] 99.8 F (37.7 C) (12/02 0507) Pulse Rate:  [91-129] 129 (12/02 0512) Resp:  [18-26] 20 (12/02 0512) BP: (112-154)/(70-88) 144/70 mmHg (12/02 0512) SpO2:  [96 %-100 %] 97 % (12/02 0512)  Physical Exam:  General: alert and no distress Lochia: appropriate Uterine Fundus: firm Incision: healing well DVT Evaluation: No evidence of DVT seen on physical exam.   Recent Labs  09/02/13 0905 09/04/13 0545  HGB 8.1* 7.3*  HCT 24.8* 22.4*    Assessment/Plan: Status post Cesarean section. Doing well postoperatively.  Anemia.  Chronic iron deficiency.  Clinically stable. Continue current care.  HARPER,CHARLES A 09/04/2013, 8:29 AM

## 2013-09-04 NOTE — Progress Notes (Signed)
When pt stands up with orthostatics /o headache - scale 8, pt dizzy and

## 2013-09-04 NOTE — Progress Notes (Signed)
UR completed 

## 2013-09-04 NOTE — Anesthesia Postprocedure Evaluation (Signed)
  Anesthesia Post-op Note  Patient: Kristine Perez  Procedure(s) Performed: Procedure(s) with comments: Primary CESAREAN SECTION with delivery of baby girl @1010  (N/A) - wound class clean-contaminated  Patient Location: Mother/Baby  Anesthesia Type:Epidural  Level of Consciousness: awake, alert  and oriented  Airway and Oxygen Therapy: Patient Spontanous Breathing  Post-op Pain: none  Post-op Assessment: Post-op Vital signs reviewed and Patient's Cardiovascular Status Stable  Post-op Vital Signs: Reviewed and stable  Complications: No apparent anesthesia complications

## 2013-09-05 NOTE — Progress Notes (Signed)
Subjective: Postpartum Day 2: Cesarean Delivery Patient reports tolerating PO, + flatus and no problems voiding.    Objective: Vital signs in last 24 hours: Temp:  [98.3 F (36.8 C)-100.3 F (37.9 C)] 98.3 F (36.8 C) (12/03 0619) Pulse Rate:  [90-112] 90 (12/03 0619) Resp:  [19-99] 19 (12/03 0619) BP: (114-147)/(77-94) 114/77 mmHg (12/03 0619) SpO2:  [98 %] 98 % (12/02 0950)  Physical Exam:  General: alert and no distress Lochia: appropriate Uterine Fundus: firm Incision: healing well DVT Evaluation: No evidence of DVT seen on physical exam.   Recent Labs  09/04/13 0545 09/04/13 2012  HGB 7.3* 6.9*  HCT 22.4* 20.9*    Assessment/Plan: Status post Cesarean section. Doing well postoperatively.  Continue current care.  HARPER,CHARLES A 09/05/2013, 8:37 AM

## 2013-09-06 ENCOUNTER — Ambulatory Visit: Payer: Self-pay

## 2013-09-06 LAB — TYPE AND SCREEN
Unit division: 0
Unit division: 0

## 2013-09-06 LAB — URINE CULTURE
Colony Count: NO GROWTH
Culture: NO GROWTH

## 2013-09-06 MED ORDER — OXYCODONE-ACETAMINOPHEN 5-325 MG PO TABS: 1.0000 | ORAL_TABLET | ORAL | Status: DC | PRN

## 2013-09-06 MED ORDER — IBUPROFEN 600 MG PO TABS: 600.0000 mg | ORAL_TABLET | Freq: Four times a day (QID) | ORAL | Status: DC | PRN

## 2013-09-06 MED ORDER — FUSION PLUS PO CAPS: 1.0000 | ORAL_CAPSULE | Freq: Every day | ORAL | Status: DC

## 2013-09-06 NOTE — Progress Notes (Signed)
Subjective: Postpartum Day 3: Cesarean Delivery Patient reports tolerating PO, + flatus, + BM and no problems voiding.    Objective: Vital signs in last 24 hours: Temp:  [98.2 F (36.8 C)-99.9 F (37.7 C)] 98.2 F (36.8 C) (12/04 0556) Pulse Rate:  [93-111] 93 (12/04 0556) Resp:  [20] 20 (12/04 0556) BP: (120-134)/(74-87) 134/87 mmHg (12/04 0556) SpO2:  [98 %-99 %] 98 % (12/04 0556)  Physical Exam:  General: alert and no distress Lochia: appropriate Uterine Fundus: firm Incision: healing well DVT Evaluation: No evidence of DVT seen on physical exam.   Recent Labs  09/04/13 0545 09/04/13 2012  HGB 7.3* 6.9*  HCT 22.4* 20.9*    Assessment/Plan: Status post Cesarean section. Doing well postoperatively.  Anemia.  Chronic.  Clinically stable.  Iron Rx. Discharge home with standard precautions and return to clinic in 4-6 weeks.  Donita Newland A 09/06/2013, 8:24 AM

## 2013-09-06 NOTE — Discharge Summary (Signed)
Obstetric Discharge Summary Reason for Admission: rupture of membranes Prenatal Procedures: ultrasound Intrapartum Procedures: cesarean: low cervical, transverse Postpartum Procedures: none Complications-Operative and Postpartum: none Hemoglobin  Date Value Range Status  09/04/2013 6.9* 12.0 - 15.0 g/dL Final     REPEATED TO VERIFY     DELTA CHECK NOTED     CRITICAL RESULT CALLED TO, READ BACK BY AND VERIFIED WITH:     EARLE,BETH AT 2030 ON Sep 04 2013 BY HOUEGNIFIO M     HCT  Date Value Range Status  09/04/2013 20.9* 36.0 - 46.0 % Final    Physical Exam:  General: alert and no distress Lochia: appropriate Uterine Fundus: firm Incision: healing well DVT Evaluation: No evidence of DVT seen on physical exam.  Discharge Diagnoses: Term Pregnancy-delivered  Discharge Information: Date: 09/06/2013 Activity: pelvic rest Diet: routine Medications: PNV, Ibuprofen, Colace, Iron and Percocet Condition: stable Instructions: refer to practice specific booklet Discharge to: home Follow-up Information   Follow up with Antionette Char A, MD In 2 weeks.   Specialty:  Obstetrics and Gynecology   Contact information:   [address] [place] [address] [phone]       Newborn Data: Live born female  Birth Weight: 7 lb 4.9 oz (3315 g) APGAR: 9, 9  Home with mother.  Sang Blount A 09/06/2013, 8:33 AM

## 2013-09-06 NOTE — Lactation Note (Signed)
This note was copied from the chart of Kristine Perez. Lactation Consultation Note  Patient Name: Kristine Rondell Frick WRUEA'V Date: 09/06/2013 Reason for consult: Follow-up assessment Mom is pump and bottle feeding, denies questions or concerns. Engorgement care reviewed if needed. Mom reports she has pump at home. Advised of OP services and support group.   Maternal Data    Feeding Feeding Type: Bottle Fed - Breast Milk  LATCH Score/Interventions                      Lactation Tools Discussed/Used     Consult Status Consult Status: Complete Date: 09/06/13 Follow-up type: In-patient    Alfred Levins 09/06/2013, 11:15 AM

## 2013-09-17 ENCOUNTER — Encounter: Payer: Self-pay | Admitting: *Deleted

## 2013-09-17 ENCOUNTER — Encounter: Payer: Self-pay | Admitting: Obstetrics

## 2013-09-17 ENCOUNTER — Ambulatory Visit (INDEPENDENT_AMBULATORY_CARE_PROVIDER_SITE_OTHER): Payer: Medicaid Other | Admitting: Obstetrics

## 2013-09-17 DIAGNOSIS — Z3009 Encounter for other general counseling and advice on contraception: Secondary | ICD-10-CM

## 2013-09-17 DIAGNOSIS — L02429 Furuncle of limb, unspecified: Secondary | ICD-10-CM

## 2013-09-17 MED ORDER — MUPIROCIN CALCIUM 2 % EX CREA: 1.0000 "application " | TOPICAL_CREAM | Freq: Two times a day (BID) | CUTANEOUS | Status: DC

## 2013-09-17 MED ORDER — MEDROXYPROGESTERONE ACETATE 150 MG/ML IM SUSP: 150.0000 mg | INTRAMUSCULAR | Status: DC

## 2013-09-17 NOTE — Progress Notes (Signed)
Subjective:     Kristine Perez is a 22 y.o. female who presents for a postpartum visit. She is 2 weeks postpartum following a low cervical transverse Cesarean section. I have fully reviewed the prenatal and intrapartum course. The delivery was at 37.6 gestational weeks. Outcome: primary cesarean section, low transverse incision. Anesthesia:epidural and spinal. Postpartum course has been going well. Baby's course has been doing well . Baby is feeding by breast. Bleeding no bleeding. Bowel function is normal. Bladder function is normal. Patient is not sexually active. Contraception method is none. Postpartum depression screening: negative.  Pt would like to discuss birth control.  Pt would like Depo.  The following portions of the patient's history were reviewed and updated as appropriate: allergies, current medications, past family history, past medical history, past social history, past surgical history and problem list.  Review of Systems Pertinent items are noted in HPI.   Objective:    BP 138/89  Pulse 88  Temp(Src) 99.3 F (37.4 C)  Wt 199 lb (90.266 kg)  LMP 12/13/2012  Breastfeeding? Yes  General:  alert and no distress   Breasts:  inspection negative, no nipple discharge or bleeding, no masses or nodularity palpable Abdomen:  Incision C, D, I.  Soft, NT. Axilla:  Bilateral boils.   Assessment:     Normal  postpartum exam. Pap smear not done at today's visit.    Contraceptive counseling done.   Boils in axilla.  Plan:    1. Contraception: Depo-Provera injections 2. Continue PNV's 3. Follow up in: 4 weeks or as needed.  4. Mupirocin Rx.

## 2013-10-15 ENCOUNTER — Ambulatory Visit: Payer: Medicaid Other | Admitting: Obstetrics

## 2013-10-16 ENCOUNTER — Ambulatory Visit (INDEPENDENT_AMBULATORY_CARE_PROVIDER_SITE_OTHER): Payer: Medicaid Other | Admitting: Obstetrics

## 2013-10-16 ENCOUNTER — Encounter: Payer: Self-pay | Admitting: Obstetrics

## 2013-10-16 ENCOUNTER — Encounter: Payer: Self-pay | Admitting: *Deleted

## 2013-10-16 VITALS — BP 126/75 | HR 80 | Temp 97.5°F | Ht 67.0 in | Wt 194.0 lb

## 2013-10-16 DIAGNOSIS — D649 Anemia, unspecified: Secondary | ICD-10-CM

## 2013-10-16 DIAGNOSIS — Z3202 Encounter for pregnancy test, result negative: Secondary | ICD-10-CM

## 2013-10-16 DIAGNOSIS — Z3009 Encounter for other general counseling and advice on contraception: Secondary | ICD-10-CM

## 2013-10-16 DIAGNOSIS — Z7251 High risk heterosexual behavior: Secondary | ICD-10-CM

## 2013-10-16 LAB — CBC
HCT: 28.5 % — ABNORMAL LOW (ref 36.0–46.0)
Hemoglobin: 8.9 g/dL — ABNORMAL LOW (ref 12.0–15.0)
MCH: 23.1 pg — AB (ref 26.0–34.0)
MCHC: 31.2 g/dL (ref 30.0–36.0)
MCV: 73.8 fL — AB (ref 78.0–100.0)
PLATELETS: 331 10*3/uL (ref 150–400)
RBC: 3.86 MIL/uL — ABNORMAL LOW (ref 3.87–5.11)
RDW: 17 % — AB (ref 11.5–15.5)
WBC: 5.8 10*3/uL (ref 4.0–10.5)

## 2013-10-16 LAB — POCT URINE PREGNANCY: Preg Test, Ur: NEGATIVE

## 2013-10-16 MED ORDER — NORETHINDRONE 0.35 MG PO TABS: 1.0000 | ORAL_TABLET | Freq: Every day | ORAL | Status: DC

## 2013-10-16 MED ORDER — LEVONORGESTREL-ETHINYL ESTRAD 0.15-30 MG-MCG PO TABS: 1.0000 | ORAL_TABLET | Freq: Every day | ORAL | Status: DC

## 2013-10-16 NOTE — Progress Notes (Signed)
Subjective:     Kristine Perez is a 23 y.o. female who presents for a postpartum visit. She is 6 weeks postpartum following a low cervical transverse Cesarean section. I have fully reviewed the prenatal and intrapartum course. The delivery was at 37 gestational weeks. Outcome: primary cesarean section, low transverse incision. Anesthesia: epidural and spinal. Postpartum course has been WNL. Baby's course has been WNL. Baby is feeding by breast. Bleeding no bleeding. Bowel function is normal. Bladder function is normal. Patient is sexually active. Contraception method is none. Postpartum depression screening: negative.  The following portions of the patient's history were reviewed and updated as appropriate: allergies, current medications, past family history, past medical history, past social history, past surgical history and problem list.  Review of Systems Pertinent items are noted in HPI.   Objective:    BP 126/75  Pulse 80  Temp(Src) 97.5 F (36.4 C)  Ht 5\' 7"  (1.702 m)  Wt 194 lb (87.998 kg)  BMI 30.38 kg/m2  LMP 10/05/2013  Breastfeeding? Yes  General:  alert and no distress Breasts:  Negative for masses or tenderness. Abdomen:  Soft, NT. NEFG.  Uterus NSSC, NT.   Assessment:     Normal postpartum exam. Pap smear not done at today's visit.   Plan:    1. Contraception: oral progesterone-only contraceptive 2. Continue Iron. 3. Follow up in: 6 weeks or as needed.

## 2013-11-27 ENCOUNTER — Ambulatory Visit: Payer: Medicaid Other | Admitting: Obstetrics

## 2013-12-05 ENCOUNTER — Ambulatory Visit: Payer: Medicaid Other | Admitting: Obstetrics

## 2013-12-06 ENCOUNTER — Ambulatory Visit: Payer: Medicaid Other | Admitting: Obstetrics

## 2013-12-09 ENCOUNTER — Encounter (HOSPITAL_COMMUNITY): Payer: Self-pay | Admitting: *Deleted

## 2013-12-09 ENCOUNTER — Inpatient Hospital Stay (HOSPITAL_COMMUNITY): Payer: Medicaid Other

## 2013-12-09 ENCOUNTER — Inpatient Hospital Stay (HOSPITAL_COMMUNITY)
Admission: AD | Admit: 2013-12-09 | Discharge: 2013-12-10 | Disposition: A | Discharge status: Home / Self Care | Payer: Self-pay | Source: Ambulatory Visit | Attending: Obstetrics & Gynecology | Admitting: Obstetrics & Gynecology

## 2013-12-09 DIAGNOSIS — R112 Nausea with vomiting, unspecified: Secondary | ICD-10-CM | POA: Insufficient documentation

## 2013-12-09 DIAGNOSIS — N83209 Unspecified ovarian cyst, unspecified side: Secondary | ICD-10-CM | POA: Insufficient documentation

## 2013-12-09 DIAGNOSIS — R109 Unspecified abdominal pain: Secondary | ICD-10-CM | POA: Insufficient documentation

## 2013-12-09 LAB — CBC
HCT: 32 % — ABNORMAL LOW (ref 36.0–46.0)
Hemoglobin: 10.5 g/dL — ABNORMAL LOW (ref 12.0–15.0)
MCH: 24.1 pg — AB (ref 26.0–34.0)
MCHC: 32.8 g/dL (ref 30.0–36.0)
MCV: 73.6 fL — ABNORMAL LOW (ref 78.0–100.0)
PLATELETS: 253 10*3/uL (ref 150–400)
RBC: 4.35 MIL/uL (ref 3.87–5.11)
RDW: 14.9 % (ref 11.5–15.5)
WBC: 11.7 10*3/uL — ABNORMAL HIGH (ref 4.0–10.5)

## 2013-12-09 LAB — URINALYSIS, ROUTINE W REFLEX MICROSCOPIC
Bilirubin Urine: NEGATIVE
Glucose, UA: NEGATIVE mg/dL
Hgb urine dipstick: NEGATIVE
KETONES UR: 15 mg/dL — AB
LEUKOCYTES UA: NEGATIVE
NITRITE: NEGATIVE
PROTEIN: NEGATIVE mg/dL
Specific Gravity, Urine: >1.03 — ABNORMAL HIGH (ref 1.005–1.030)
UROBILINOGEN UA: 0.2 mg/dL (ref 0.0–1.0)
pH: 6 (ref 5.0–8.0)

## 2013-12-09 LAB — POCT PREGNANCY, URINE: Preg Test, Ur: NEGATIVE

## 2013-12-09 MED ORDER — PROMETHAZINE HCL 25 MG PO TABS
25.0000 mg | ORAL_TABLET | Freq: Once | ORAL | Status: AC
  Administered 2013-12-09: 25 mg via ORAL
  Filled 2013-12-09: qty 1

## 2013-12-09 MED ORDER — CYCLOBENZAPRINE HCL 10 MG PO TABS
10.0000 mg | ORAL_TABLET | Freq: Once | ORAL | Status: AC
  Administered 2013-12-09: 10 mg via ORAL
  Filled 2013-12-09: qty 1

## 2013-12-09 NOTE — MAU Provider Note (Signed)
History     CSN: [id number]  Arrival date and time: 12/09/13 2139   First Provider Initiated Contact with Patient 12/09/13 2211      Chief Complaint  Patient presents with  . Abdominal Pain  . Emesis  . Back Pain   Abdominal Pain Associated symptoms include vomiting.  Emesis  Associated symptoms include abdominal pain.  Back Pain Associated symptoms include abdominal pain.    Kristine Perez is a 23 y.o. G1P1001 who presents today with abdominal pain, back pain and nausea. She states that her symptoms started this morning. She denies any fever, diarrhea or close contacts who have been sick. She states that she has been using the mini-pill for contraception, but in the last week she has stopped breastfeeding.   Past Medical History  Diagnosis Date  . Headache(784.0)     migraines  . Anemia     Past Surgical History  Procedure Laterality Date  . Abscess      under arm  . No past surgeries    . Cesarean section N/A 09/03/2013    Procedure: Primary CESAREAN SECTION with delivery of baby girl @1010 ;  Surgeon: Antionette Char, MD;  Location: [facility];  Service: Obstetrics;  Laterality: N/A;  wound class clean-contaminated    Family History  Problem Relation Age of Onset  . Hypertension Mother   . Mental illness Mother   . Diabetes Maternal Grandmother   . Heart disease Maternal Grandmother   . Diabetes Paternal Grandmother   . Hypertension Paternal Grandmother   . Cancer Paternal Grandmother     History  Substance Use Topics  . Smoking status: Never Smoker   . Smokeless tobacco: Never Used  . Alcohol Use: No    Allergies: No Known Allergies  Prescriptions prior to admission  Medication Sig Dispense Refill  . Iron-FA-B Cmp-C-Biot-Probiotic (FUSION PLUS) CAPS Take 1 capsule by mouth daily before breakfast.  30 capsule  5  . norethindrone (MICRONOR,CAMILA,ERRIN) 0.35 MG tablet Take 1 tablet (0.35 mg total) by mouth daily.  1 Package  11  .  medroxyPROGESTERone (DEPO-PROVERA) 150 MG/ML injection Inject 1 mL (150 mg total) into the muscle every 3 (three) months.  1 mL  0    Review of Systems  Gastrointestinal: Positive for vomiting and abdominal pain.  Musculoskeletal: Positive for back pain.   Physical Exam   Blood pressure 125/70, pulse 110, temperature 99.1 F (37.3 C), temperature source Oral, resp. rate 18, height 5\' 6"  (1.676 m), weight 86.354 kg (190 lb 6 oz), last menstrual period 11/20/2013, SpO2 100.00%, not currently breastfeeding.  Physical Exam  Nursing note and vitals reviewed. Constitutional: She is oriented to person, place, and time. She appears well-developed and well-nourished. No distress.  Cardiovascular: Normal rate.   Respiratory: Effort normal.  GI: Soft. She exhibits no mass. There is tenderness (diffusely tender all over). There is no rebound and no guarding.  Neurological: She is alert and oriented to person, place, and time.  Skin: Skin is warm and dry.  Psychiatric: She has a normal mood and affect.    MAU Course  Procedures  Results for orders placed during the hospital encounter of 12/09/13 (from the past 24 hour(s))  URINALYSIS, ROUTINE W REFLEX MICROSCOPIC     Status: Abnormal   Collection Time    12/09/13  9:46 PM      Result Value Ref Range   Color, Urine YELLOW  YELLOW   APPearance CLEAR  CLEAR   Specific Gravity, Urine >1.030 (*)  1.005 - 1.030   pH 6.0  5.0 - 8.0   Glucose, UA NEGATIVE  NEGATIVE mg/dL   Hgb urine dipstick NEGATIVE  NEGATIVE   Bilirubin Urine NEGATIVE  NEGATIVE   Ketones, ur 15 (*) NEGATIVE mg/dL   Protein, ur NEGATIVE  NEGATIVE mg/dL   Urobilinogen, UA 0.2  0.0 - 1.0 mg/dL   Nitrite NEGATIVE  NEGATIVE   Leukocytes, UA NEGATIVE  NEGATIVE  POCT PREGNANCY, URINE     Status: None   Collection Time    12/09/13  9:55 PM      Result Value Ref Range   Preg Test, Ur NEGATIVE  NEGATIVE  CBC     Status: Abnormal   Collection Time    12/09/13 10:20 PM      Result  Value Ref Range   WBC 11.7 (*) 4.0 - 10.5 K/uL   RBC 4.35  3.87 - 5.11 MIL/uL   Hemoglobin 10.5 (*) 12.0 - 15.0 g/dL   HCT 95.232.0 (*) 84.136.0 - 32.446.0 %   MCV 73.6 (*) 78.0 - 100.0 fL   MCH 24.1 (*) 26.0 - 34.0 pg   MCHC 32.8  30.0 - 36.0 g/dL   RDW 40.114.9  02.711.5 - 25.315.5 %   Platelets 253  150 - 400 K/uL   [place] Transvaginal Non-ob  12/09/2013   CLINICAL DATA:  Midabdominal pain/pelvic pain.  EXAM: TRANSABDOMINAL AND TRANSVAGINAL ULTRASOUND OF PELVIS  TECHNIQUE: Both transabdominal and transvaginal ultrasound examinations of the pelvis were performed. Transabdominal technique was performed for global imaging of the pelvis including uterus, ovaries, adnexal regions, and pelvic cul-de-sac. It was necessary to proceed with endovaginal exam following the transabdominal exam to visualize the endometrium and adnexa.  COMPARISON:  None  FINDINGS: Uterus  Measurements: 8.8 x 4.6 x 5.0 cm. No fibroids or other mass visualized.  Endometrium  Thickness: 9 mm.  No focal abnormality visualized.  Right ovary  Measurements: 3.4 x 2.0 x 2.4 cm. Normal appearance/no adnexal mass.  Left ovary  Measurements: 4.2 x 3.0 x 2.9 cm. 2.8 x 2.7 x 2.5 cm cyst with internal septations.  Other findings  Small amount of mildly complex fluid within the cul-de-sac.  IMPRESSION: 2.8 cm left ovarian cyst with internal septations, may reflect a hemorrhagic cyst. Recommend 6 week ultrasound follow-up to document resolution.  Small amount of mildly complex fluid within the cul-de-sac, may reflect blood products.   Electronically Signed   By: Jearld LeschAndrew  DelGaizo M.D.   On: 12/09/2013 23:48   [place] Pelvis Complete  12/09/2013   CLINICAL DATA:  Midabdominal pain/pelvic pain.  EXAM: TRANSABDOMINAL AND TRANSVAGINAL ULTRASOUND OF PELVIS  TECHNIQUE: Both transabdominal and transvaginal ultrasound examinations of the pelvis were performed. Transabdominal technique was performed for global imaging of the pelvis including uterus, ovaries, adnexal regions, and pelvic  cul-de-sac. It was necessary to proceed with endovaginal exam following the transabdominal exam to visualize the endometrium and adnexa.  COMPARISON:  None  FINDINGS: Uterus  Measurements: 8.8 x 4.6 x 5.0 cm. No fibroids or other mass visualized.  Endometrium  Thickness: 9 mm.  No focal abnormality visualized.  Right ovary  Measurements: 3.4 x 2.0 x 2.4 cm. Normal appearance/no adnexal mass.  Left ovary  Measurements: 4.2 x 3.0 x 2.9 cm. 2.8 x 2.7 x 2.5 cm cyst with internal septations.  Other findings  Small amount of mildly complex fluid within the cul-de-sac.  IMPRESSION: 2.8 cm left ovarian cyst with internal septations, may reflect a hemorrhagic cyst. Recommend 6 week ultrasound follow-up  to document resolution.  Small amount of mildly complex fluid within the cul-de-sac, may reflect blood products.   Electronically Signed   By: Jearld Lesch M.D.   On: 12/09/2013 23:48     Assessment and Plan   1. Other and unspecified ovarian cyst    FU with the office for repeat US in 6 weeks Consider new method of contraception now that you have weaned Return to MAU as needed  Follow-up Information   Follow up with [facility] In 6 weeks.   Specialty:  Obstetrics and Gynecology   Contact information:   [address], [address] [place] [address] [phone]       Tawnya Crook 12/09/2013, 10:17 PM

## 2013-12-09 NOTE — MAU Note (Signed)
Woke up with bad abdominal pain this morning but after coming back from a birthday party this evening the pain was worse. Patient states the pain is now in her back as well in addition to vomiting.

## 2013-12-09 NOTE — Discharge Instructions (Signed)

## 2013-12-11 LAB — URINE CULTURE: Colony Count: 75000

## 2014-01-22 ENCOUNTER — Encounter: Payer: Self-pay | Admitting: *Deleted

## 2014-05-17 ENCOUNTER — Encounter (HOSPITAL_COMMUNITY)
Admission: AD | Disposition: A | Discharge status: Home / Self Care | Payer: Self-pay | Source: Ambulatory Visit | Attending: Emergency Medicine

## 2014-05-17 ENCOUNTER — Inpatient Hospital Stay (HOSPITAL_COMMUNITY): Payer: BC Managed Care – PPO | Admitting: Anesthesiology

## 2014-05-17 ENCOUNTER — Encounter (HOSPITAL_COMMUNITY): Payer: Self-pay

## 2014-05-17 ENCOUNTER — Encounter (HOSPITAL_COMMUNITY): Payer: BC Managed Care – PPO | Admitting: Anesthesiology

## 2014-05-17 ENCOUNTER — Inpatient Hospital Stay (HOSPITAL_COMMUNITY): Payer: BC Managed Care – PPO

## 2014-05-17 ENCOUNTER — Observation Stay (HOSPITAL_COMMUNITY)
Admission: AD | Admit: 2014-05-17 | Discharge: 2014-05-19 | Disposition: A | Discharge status: Home / Self Care | Payer: BC Managed Care – PPO | Source: Ambulatory Visit | Attending: General Surgery | Admitting: General Surgery

## 2014-05-17 DIAGNOSIS — K3580 Unspecified acute appendicitis: Principal | ICD-10-CM | POA: Diagnosis present

## 2014-05-17 DIAGNOSIS — R112 Nausea with vomiting, unspecified: Secondary | ICD-10-CM

## 2014-05-17 DIAGNOSIS — R109 Unspecified abdominal pain: Secondary | ICD-10-CM

## 2014-05-17 DIAGNOSIS — K3589 Other acute appendicitis without perforation or gangrene: Secondary | ICD-10-CM

## 2014-05-17 DIAGNOSIS — K352 Acute appendicitis with generalized peritonitis, without abscess: Secondary | ICD-10-CM

## 2014-05-17 DIAGNOSIS — K37 Unspecified appendicitis: Secondary | ICD-10-CM | POA: Diagnosis present

## 2014-05-17 HISTORY — PX: LAPAROSCOPIC APPENDECTOMY: SHX408

## 2014-05-17 LAB — COMPREHENSIVE METABOLIC PANEL
ALT: 9 U/L (ref 0–35)
AST: 11 U/L (ref 0–37)
Albumin: 4.4 g/dL (ref 3.5–5.2)
Alkaline Phosphatase: 61 U/L (ref 39–117)
Anion gap: 11 (ref 5–15)
BILIRUBIN TOTAL: 0.3 mg/dL (ref 0.3–1.2)
BUN: 12 mg/dL (ref 6–23)
CHLORIDE: 100 meq/L (ref 96–112)
CO2: 26 meq/L (ref 19–32)
Calcium: 10 mg/dL (ref 8.4–10.5)
Creatinine, Ser: 0.75 mg/dL (ref 0.50–1.10)
Glucose, Bld: 126 mg/dL — ABNORMAL HIGH (ref 70–99)
Potassium: 4.2 mEq/L (ref 3.7–5.3)
SODIUM: 137 meq/L (ref 137–147)
Total Protein: 8.6 g/dL — ABNORMAL HIGH (ref 6.0–8.3)

## 2014-05-17 LAB — POCT PREGNANCY, URINE: Preg Test, Ur: NEGATIVE

## 2014-05-17 LAB — LIPASE, BLOOD: Lipase: 14 U/L (ref 11–59)

## 2014-05-17 LAB — I-STAT TROPONIN, ED: Troponin i, poc: 0 ng/mL (ref 0.00–0.08)

## 2014-05-17 LAB — CBC WITH DIFFERENTIAL/PLATELET
Basophils Absolute: 0 10*3/uL (ref 0.0–0.1)
Basophils Relative: 0 % (ref 0–1)
EOS PCT: 0 % (ref 0–5)
Eosinophils Absolute: 0 10*3/uL (ref 0.0–0.7)
HEMATOCRIT: 35.6 % — AB (ref 36.0–46.0)
Hemoglobin: 12 g/dL (ref 12.0–15.0)
LYMPHS PCT: 12 % (ref 12–46)
Lymphs Abs: 1.7 10*3/uL (ref 0.7–4.0)
MCH: 25.8 pg — ABNORMAL LOW (ref 26.0–34.0)
MCHC: 33.7 g/dL (ref 30.0–36.0)
MCV: 76.4 fL — ABNORMAL LOW (ref 78.0–100.0)
Monocytes Absolute: 0.6 10*3/uL (ref 0.1–1.0)
Monocytes Relative: 5 % (ref 3–12)
NEUTROS ABS: 11.9 10*3/uL — AB (ref 1.7–7.7)
Neutrophils Relative %: 83 % — ABNORMAL HIGH (ref 43–77)
PLATELETS: 349 10*3/uL (ref 150–400)
RBC: 4.66 MIL/uL (ref 3.87–5.11)
RDW: 13.9 % (ref 11.5–15.5)
WBC: 14.2 10*3/uL — AB (ref 4.0–10.5)

## 2014-05-17 LAB — AMYLASE: Amylase: 85 U/L (ref 0–105)

## 2014-05-17 LAB — URINALYSIS, ROUTINE W REFLEX MICROSCOPIC
Bilirubin Urine: NEGATIVE
GLUCOSE, UA: NEGATIVE mg/dL
Hgb urine dipstick: NEGATIVE
Ketones, ur: NEGATIVE mg/dL
Nitrite: NEGATIVE
Protein, ur: NEGATIVE mg/dL
SPECIFIC GRAVITY, URINE: 1.02 (ref 1.005–1.030)
UROBILINOGEN UA: 0.2 mg/dL (ref 0.0–1.0)
pH: 6 (ref 5.0–8.0)

## 2014-05-17 LAB — URINE MICROSCOPIC-ADD ON

## 2014-05-17 SURGERY — APPENDECTOMY, LAPAROSCOPIC
Anesthesia: General

## 2014-05-17 MED ORDER — ROCURONIUM BROMIDE 100 MG/10ML IV SOLN: INTRAVENOUS | Status: DC | PRN

## 2014-05-17 MED ORDER — MORPHINE SULFATE 2 MG/ML IJ SOLN
1.0000 mg | INTRAMUSCULAR | Status: DC | PRN
  Administered 2014-05-17: 4 mg via INTRAVENOUS
  Administered 2014-05-17: 2 mg via INTRAVENOUS
  Filled 2014-05-17: qty 1
  Filled 2014-05-17: qty 2

## 2014-05-17 MED ORDER — METRONIDAZOLE IN NACL 5-0.79 MG/ML-% IV SOLN
INTRAVENOUS | Status: AC
  Filled 2014-05-17: qty 100

## 2014-05-17 MED ORDER — IOHEXOL 300 MG/ML  SOLN
100.0000 mL | Freq: Once | INTRAMUSCULAR | Status: AC | PRN
  Administered 2014-05-17: 100 mL via INTRAVENOUS

## 2014-05-17 MED ORDER — MIDAZOLAM HCL 2 MG/2ML IJ SOLN
INTRAMUSCULAR | Status: AC
  Filled 2014-05-17: qty 2

## 2014-05-17 MED ORDER — HYDROMORPHONE HCL PF 1 MG/ML IJ SOLN
0.2500 mg | INTRAMUSCULAR | Status: DC | PRN
  Administered 2014-05-17 (×2): 0.5 mg via INTRAVENOUS

## 2014-05-17 MED ORDER — ROCURONIUM BROMIDE 100 MG/10ML IV SOLN
INTRAVENOUS | Status: AC
  Filled 2014-05-17: qty 1

## 2014-05-17 MED ORDER — SUCCINYLCHOLINE CHLORIDE 20 MG/ML IJ SOLN
INTRAMUSCULAR | Status: DC | PRN
  Administered 2014-05-17: 100 mg via INTRAVENOUS

## 2014-05-17 MED ORDER — METOCLOPRAMIDE HCL 5 MG/ML IJ SOLN
INTRAMUSCULAR | Status: DC | PRN
  Administered 2014-05-17: 10 mg via INTRAVENOUS

## 2014-05-17 MED ORDER — BUPIVACAINE HCL (PF) 0.25 % IJ SOLN
INTRAMUSCULAR | Status: AC
  Filled 2014-05-17: qty 30

## 2014-05-17 MED ORDER — ROCURONIUM BROMIDE 100 MG/10ML IV SOLN
INTRAVENOUS | Status: DC | PRN
  Administered 2014-05-17: 40 mg via INTRAVENOUS
  Administered 2014-05-17: 5 mg via INTRAVENOUS

## 2014-05-17 MED ORDER — HYDROMORPHONE HCL PF 1 MG/ML IJ SOLN
1.0000 mg | Freq: Once | INTRAMUSCULAR | Status: AC
  Administered 2014-05-17: 1 mg via INTRAVENOUS
  Filled 2014-05-17: qty 1

## 2014-05-17 MED ORDER — LIDOCAINE HCL (CARDIAC) 20 MG/ML IV SOLN
INTRAVENOUS | Status: AC
  Filled 2014-05-17: qty 5

## 2014-05-17 MED ORDER — METOCLOPRAMIDE HCL 5 MG/ML IJ SOLN
INTRAMUSCULAR | Status: AC
  Filled 2014-05-17: qty 2

## 2014-05-17 MED ORDER — LACTATED RINGERS IR SOLN
Status: DC | PRN
  Administered 2014-05-17: 1000 mL

## 2014-05-17 MED ORDER — LACTATED RINGERS IV SOLN: INTRAVENOUS | Status: DC

## 2014-05-17 MED ORDER — HYDROCODONE-ACETAMINOPHEN 5-325 MG PO TABS
1.0000 | ORAL_TABLET | ORAL | Status: DC | PRN
  Administered 2014-05-17 – 2014-05-19 (×5): 2 via ORAL
  Filled 2014-05-17 (×5): qty 2

## 2014-05-17 MED ORDER — GLYCOPYRROLATE 0.2 MG/ML IJ SOLN
INTRAMUSCULAR | Status: DC | PRN
  Administered 2014-05-17: .6 mg via INTRAVENOUS

## 2014-05-17 MED ORDER — IOHEXOL 300 MG/ML  SOLN
50.0000 mL | Freq: Once | INTRAMUSCULAR | Status: AC | PRN
  Administered 2014-05-17: 50 mL via ORAL

## 2014-05-17 MED ORDER — MIDAZOLAM HCL 5 MG/5ML IJ SOLN
INTRAMUSCULAR | Status: DC | PRN
  Administered 2014-05-17: 2 mg via INTRAVENOUS

## 2014-05-17 MED ORDER — HEPARIN SODIUM (PORCINE) 5000 UNIT/ML IJ SOLN
5000.0000 [IU] | Freq: Three times a day (TID) | INTRAMUSCULAR | Status: DC
  Administered 2014-05-17 – 2014-05-19 (×5): 5000 [IU] via SUBCUTANEOUS
  Filled 2014-05-17 (×9): qty 1

## 2014-05-17 MED ORDER — ONDANSETRON HCL 4 MG/2ML IJ SOLN
INTRAMUSCULAR | Status: AC
  Filled 2014-05-17: qty 2

## 2014-05-17 MED ORDER — LACTATED RINGERS IV SOLN
INTRAVENOUS | Status: DC
  Administered 2014-05-17: 1000 mL via INTRAVENOUS
  Administered 2014-05-17: 13:00:00 via INTRAVENOUS

## 2014-05-17 MED ORDER — ONDANSETRON HCL 4 MG/2ML IJ SOLN: 4.0000 mg | INTRAMUSCULAR | Status: DC | PRN

## 2014-05-17 MED ORDER — ONDANSETRON HCL 4 MG/2ML IJ SOLN
4.0000 mg | Freq: Once | INTRAMUSCULAR | Status: AC
  Administered 2014-05-17: 4 mg via INTRAVENOUS
  Filled 2014-05-17: qty 2

## 2014-05-17 MED ORDER — PROPOFOL 10 MG/ML IV BOLUS
INTRAVENOUS | Status: AC
  Filled 2014-05-17: qty 20

## 2014-05-17 MED ORDER — PROPOFOL 10 MG/ML IV BOLUS
INTRAVENOUS | Status: DC | PRN
  Administered 2014-05-17: 200 mg via INTRAVENOUS

## 2014-05-17 MED ORDER — LACTATED RINGERS IV BOLUS (SEPSIS)
1000.0000 mL | Freq: Once | INTRAVENOUS | Status: AC
  Administered 2014-05-17: 1000 mL via INTRAVENOUS

## 2014-05-17 MED ORDER — ONDANSETRON HCL 4 MG/2ML IJ SOLN
INTRAMUSCULAR | Status: DC | PRN
  Administered 2014-05-17: 4 mg via INTRAVENOUS

## 2014-05-17 MED ORDER — LIDOCAINE HCL (CARDIAC) 20 MG/ML IV SOLN
INTRAVENOUS | Status: DC | PRN
  Administered 2014-05-17: 50 mg via INTRAVENOUS

## 2014-05-17 MED ORDER — METOCLOPRAMIDE HCL 5 MG/ML IJ SOLN
10.0000 mg | Freq: Once | INTRAMUSCULAR | Status: AC
  Administered 2014-05-17: 10 mg via INTRAVENOUS
  Filled 2014-05-17: qty 2

## 2014-05-17 MED ORDER — METRONIDAZOLE IN NACL 5-0.79 MG/ML-% IV SOLN
500.0000 mg | Freq: Three times a day (TID) | INTRAVENOUS | Status: DC
  Administered 2014-05-17: 500 mg via INTRAVENOUS
  Administered 2014-05-17: .5 g via INTRAVENOUS
  Administered 2014-05-18 – 2014-05-19 (×4): 500 mg via INTRAVENOUS
  Filled 2014-05-17 (×7): qty 100

## 2014-05-17 MED ORDER — BUPIVACAINE HCL (PF) 0.25 % IJ SOLN
INTRAMUSCULAR | Status: DC | PRN
  Administered 2014-05-17: 30 mL

## 2014-05-17 MED ORDER — SODIUM CHLORIDE 0.9 % IV SOLN
INTRAVENOUS | Status: DC
  Administered 2014-05-17: 09:00:00 via INTRAVENOUS

## 2014-05-17 MED ORDER — MORPHINE SULFATE 4 MG/ML IJ SOLN
4.0000 mg | Freq: Once | INTRAMUSCULAR | Status: AC
  Administered 2014-05-17: 4 mg via INTRAVENOUS
  Filled 2014-05-17: qty 1

## 2014-05-17 MED ORDER — DEXTROSE 5 % IV SOLN
2.0000 g | INTRAVENOUS | Status: DC
  Administered 2014-05-17 – 2014-05-18 (×2): 2 g via INTRAVENOUS
  Filled 2014-05-17 (×3): qty 2

## 2014-05-17 MED ORDER — SUFENTANIL CITRATE 50 MCG/ML IV SOLN
INTRAVENOUS | Status: AC
  Filled 2014-05-17: qty 1

## 2014-05-17 MED ORDER — HYDROMORPHONE HCL PF 1 MG/ML IJ SOLN
INTRAMUSCULAR | Status: AC
  Filled 2014-05-17: qty 1

## 2014-05-17 MED ORDER — DEXAMETHASONE SODIUM PHOSPHATE 10 MG/ML IJ SOLN
INTRAMUSCULAR | Status: AC
  Filled 2014-05-17: qty 1

## 2014-05-17 MED ORDER — LABETALOL HCL 5 MG/ML IV SOLN
INTRAVENOUS | Status: DC | PRN
  Administered 2014-05-17 (×2): 5 mg via INTRAVENOUS

## 2014-05-17 MED ORDER — DEXAMETHASONE SODIUM PHOSPHATE 10 MG/ML IJ SOLN
INTRAMUSCULAR | Status: DC | PRN
  Administered 2014-05-17: 10 mg via INTRAVENOUS

## 2014-05-17 MED ORDER — SUFENTANIL CITRATE 50 MCG/ML IV SOLN
INTRAVENOUS | Status: DC | PRN
  Administered 2014-05-17: 15 ug via INTRAVENOUS
  Administered 2014-05-17: 10 ug via INTRAVENOUS
  Administered 2014-05-17: 5 ug via INTRAVENOUS
  Administered 2014-05-17: 10 ug via INTRAVENOUS

## 2014-05-17 MED ORDER — NEOSTIGMINE METHYLSULFATE 10 MG/10ML IV SOLN
INTRAVENOUS | Status: DC | PRN
  Administered 2014-05-17: 4 mg via INTRAVENOUS

## 2014-05-17 MED ORDER — KCL IN DEXTROSE-NACL 20-5-0.45 MEQ/L-%-% IV SOLN
INTRAVENOUS | Status: DC
  Administered 2014-05-17: 18:00:00 via INTRAVENOUS
  Administered 2014-05-18: 100 mL/h via INTRAVENOUS
  Filled 2014-05-17 (×6): qty 1000

## 2014-05-17 SURGICAL SUPPLY — 40 items
APPLIER CLIP ROT 10 11.4 M/L (STAPLE)
BENZOIN TINCTURE PRP APPL 2/3 (GAUZE/BANDAGES/DRESSINGS) ×3 IMPLANT
CANISTER SUCTION 2500CC (MISCELLANEOUS) ×3 IMPLANT
CLIP APPLIE ROT 10 11.4 M/L (STAPLE) IMPLANT
CLOSURE WOUND 1/2 X4 (GAUZE/BANDAGES/DRESSINGS) ×1
CUTTER FLEX LINEAR 45M (STAPLE) ×3 IMPLANT
DECANTER SPIKE VIAL GLASS SM (MISCELLANEOUS) ×3 IMPLANT
DERMABOND ADVANCED (GAUZE/BANDAGES/DRESSINGS) ×2
DERMABOND ADVANCED .7 DNX12 (GAUZE/BANDAGES/DRESSINGS) ×1 IMPLANT
DRAPE LAPAROSCOPIC ABDOMINAL (DRAPES) ×3 IMPLANT
ELECT REM PT RETURN 9FT ADLT (ELECTROSURGICAL) ×3
ELECTRODE REM PT RTRN 9FT ADLT (ELECTROSURGICAL) ×1 IMPLANT
ENDOLOOP SUT PDS II  0 18 (SUTURE)
ENDOLOOP SUT PDS II 0 18 (SUTURE) IMPLANT
GLOVE BIOGEL PI IND STRL 7.0 (GLOVE) ×1 IMPLANT
GLOVE BIOGEL PI INDICATOR 7.0 (GLOVE) ×2
GLOVE SURG SIGNA 7.5 PF LTX (GLOVE) ×3 IMPLANT
GOWN SPEC L4 XLG W/TWL (GOWN DISPOSABLE) ×3 IMPLANT
GOWN STRL REUS W/ TWL XL LVL3 (GOWN DISPOSABLE) ×3 IMPLANT
GOWN STRL REUS W/TWL LRG LVL3 (GOWN DISPOSABLE) ×3 IMPLANT
GOWN STRL REUS W/TWL XL LVL3 (GOWN DISPOSABLE) ×6
KIT BASIN OR (CUSTOM PROCEDURE TRAY) ×3 IMPLANT
PENCIL BUTTON HOLSTER BLD 10FT (ELECTRODE) IMPLANT
POUCH SPECIMEN RETRIEVAL 10MM (ENDOMECHANICALS) ×3 IMPLANT
RELOAD 45 VASCULAR/THIN (ENDOMECHANICALS) IMPLANT
RELOAD STAPLE TA45 3.5 REG BLU (ENDOMECHANICALS) ×3 IMPLANT
SET IRRIG TUBING LAPAROSCOPIC (IRRIGATION / IRRIGATOR) ×3 IMPLANT
SHEARS HARMONIC ACE PLUS 36CM (ENDOMECHANICALS) ×3 IMPLANT
SOLUTION ANTI FOG 6CC (MISCELLANEOUS) ×3 IMPLANT
STRIP CLOSURE SKIN 1/2X4 (GAUZE/BANDAGES/DRESSINGS) ×2 IMPLANT
SUT MON AB 5-0 PS2 18 (SUTURE) ×3 IMPLANT
SUT VICRYL 0 UR6 27IN ABS (SUTURE) ×3 IMPLANT
TOWEL OR 17X26 10 PK STRL BLUE (TOWEL DISPOSABLE) ×3 IMPLANT
TOWEL OR NON WOVEN STRL DISP B (DISPOSABLE) ×3 IMPLANT
TRAY FOLEY CATH 14FRSI W/METER (CATHETERS) ×3 IMPLANT
TRAY LAP CHOLE (CUSTOM PROCEDURE TRAY) ×3 IMPLANT
TROCAR BLADELESS OPT 5 100 (ENDOMECHANICALS) ×3 IMPLANT
TROCAR XCEL BLUNT TIP 100MML (ENDOMECHANICALS) ×3 IMPLANT
TROCAR XCEL NON-BLD 11X100MML (ENDOMECHANICALS) ×3 IMPLANT
TUBING INSUFFLATION 10FT LAP (TUBING) ×3 IMPLANT

## 2014-05-17 NOTE — MAU Note (Signed)
Mid abdominal pain x 2 weeks that is constant & sharp. N/v since yesterday, vomited x 3 in the last 24 hours. Denies diarrhea/vaginal bleeding/vaginal discharge/urinary complaints/fever. LMP was beginning of July, she thinks. No birth control.

## 2014-05-17 NOTE — H&P (Signed)
[place] 1991-01-18  [id number].   Chief Complaint/Reason for Consult: Acute appendicitis HPI: This is a 23 year old black female who is about 8 months postpartum who developed some nonspecific abdominal pain for the last 3 weeks. This was minimal for the last couple weeks but became more intense yesterday. She noted right lower cautery abdominal pain. She then developed some nausea and vomiting overnight into this morning. She admits to some chills but no fevers. She denies any diarrhea. Because of persistent pain, she presented to [facility] emergency department for further evaluation. She has a white blood cell count of 14,000. She has a CT scan that reveals acute appendicitis with some fluid in the pelvic cul-de-sac and no evidence of abscess or definitive rupture. We have been asked to evaluate the patient for admission  ROS: Please see history of present illness otherwise all other systems have been reviewed and are negative  Family History  Problem Relation Age of Onset  . Hypertension Mother   . Mental illness Mother   . Diabetes Maternal Grandmother   . Heart disease Maternal Grandmother   . Diabetes Paternal Grandmother   . Hypertension Paternal Grandmother   . Cancer Paternal Grandmother     Past Medical History  Diagnosis Date  . Headache(784.0)     migraines  . Anemia     Past Surgical History  Procedure Laterality Date  . Abscess      under arm  . Cesarean section N/A 09/03/2013    Procedure: Primary CESAREAN SECTION with delivery of baby girl _0 ;  Surgeon: Lahoma Crocker, MD;  Location: [facility];  Service: Obstetrics;  Laterality: N/A;  wound class clean-contaminated    Social History:  reports that she has never smoked. She has never used smokeless tobacco. She reports that she drinks alcohol. She reports that she does not use illicit drugs.  Allergies: No Known Allergies   (Not in a hospital admission)  Blood pressure 126/68, pulse 95,  temperature 98.8 F (37.1 C), temperature source Oral, resp. rate 18, height _1  (1.702 m), weight 194 lb 6.4 oz (88.179 kg), last menstrual period 05/04/2014, SpO2 98.00%, not currently breastfeeding. Physical Exam: General: pleasant, WD, WN black female who is laying in bed in NAD HEENT: head is normocephalic, atraumatic.  Sclera are noninjected.  PERRL.  Ears and nose without any masses or lesions.  Mouth is pink and moist Heart: regular, rate, and rhythm.  Normal s1,s2. No obvious murmurs, gallops, or rubs noted.  Palpable radial and pedal pulses bilaterally Lungs: CTAB, no wheezes, rhonchi, or rales noted.  Respiratory effort nonlabored Abd: soft, tender somewhat diffusely. Greatest tenderness is in the left upper quadrant and right lower quadrant., ND, +BS, no masses, hernias, or organomegaly MS: all 4 extremities are symmetrical with no cyanosis, clubbing, or edema. Skin: warm and dry with no masses, lesions, or rashes Psych: A&Ox3 with an appropriate affect.    Results for orders placed during the hospital encounter of 05/17/14 (from the past 48 hour(s))  URINALYSIS, ROUTINE W REFLEX MICROSCOPIC     Status: Abnormal   Collection Time    05/17/14  4:01 AM      Result Value Ref Range   Color, Urine YELLOW  YELLOW   APPearance CLEAR  CLEAR   Specific Gravity, Urine 1.020  1.005 - 1.030   pH 6.0  5.0 - 8.0   Glucose, UA NEGATIVE  NEGATIVE mg/dL   Hgb urine dipstick NEGATIVE  NEGATIVE   Bilirubin Urine NEGATIVE  NEGATIVE   Ketones, ur NEGATIVE  NEGATIVE mg/dL   Protein, ur NEGATIVE  NEGATIVE mg/dL   Urobilinogen, UA 0.2  0.0 - 1.0 mg/dL   Nitrite NEGATIVE  NEGATIVE   Leukocytes, UA MODERATE (*) NEGATIVE  URINE MICROSCOPIC-ADD ON     Status: Abnormal   Collection Time    05/17/14  4:01 AM      Result Value Ref Range   Squamous Epithelial / LPF FEW (*) RARE   WBC, UA 3-6  <3 WBC/hpf   RBC / HPF 0-2  <3 RBC/hpf   Bacteria, UA FEW (*) RARE   Urine-Other MUCOUS PRESENT    POCT  PREGNANCY, URINE     Status: None   Collection Time    05/17/14  4:09 AM      Result Value Ref Range   Preg Test, Ur NEGATIVE  NEGATIVE   Comment:            THE SENSITIVITY OF THIS     METHODOLOGY IS >24 mIU/mL  CBC WITH DIFFERENTIAL     Status: Abnormal   Collection Time    05/17/14  4:47 AM      Result Value Ref Range   WBC 14.2 (*) 4.0 - 10.5 K/uL   RBC 4.66  3.87 - 5.11 MIL/uL   Hemoglobin 12.0  12.0 - 15.0 g/dL   HCT 35.6 (*) 36.0 - 46.0 %   MCV 76.4 (*) 78.0 - 100.0 fL   MCH 25.8 (*) 26.0 - 34.0 pg   MCHC 33.7  30.0 - 36.0 g/dL   RDW 13.9  11.5 - 15.5 %   Platelets 349  150 - 400 K/uL   Neutrophils Relative % 83 (*) 43 - 77 %   Neutro Abs 11.9 (*) 1.7 - 7.7 K/uL   Lymphocytes Relative 12  12 - 46 %   Lymphs Abs 1.7  0.7 - 4.0 K/uL   Monocytes Relative 5  3 - 12 %   Monocytes Absolute 0.6  0.1 - 1.0 K/uL   Eosinophils Relative 0  0 - 5 %   Eosinophils Absolute 0.0  0.0 - 0.7 K/uL   Basophils Relative 0  0 - 1 %   Basophils Absolute 0.0  0.0 - 0.1 K/uL  COMPREHENSIVE METABOLIC PANEL     Status: Abnormal   Collection Time    05/17/14  4:47 AM      Result Value Ref Range   Sodium 137  137 - 147 mEq/L   Potassium 4.2  3.7 - 5.3 mEq/L   Chloride 100  96 - 112 mEq/L   CO2 26  19 - 32 mEq/L   Glucose, Bld 126 (*) 70 - 99 mg/dL   BUN 12  6 - 23 mg/dL   Creatinine, Ser 0.75  0.50 - 1.10 mg/dL   Calcium 10.0  8.4 - 10.5 mg/dL   Total Protein 8.6 (*) 6.0 - 8.3 g/dL   Albumin 4.4  3.5 - 5.2 g/dL   AST 11  0 - 37 U/L   ALT 9  0 - 35 U/L   Alkaline Phosphatase 61  39 - 117 U/L   Total Bilirubin 0.3  0.3 - 1.2 mg/dL   GFR calc non Af Amer >90  >90 mL/min   GFR calc Af Amer >90  >90 mL/min   Comment: (NOTE)     The eGFR has been calculated using the CKD EPI equation.     This calculation has not been validated in all clinical  situations.     eGFR's persistently <90 mL/min signify possible Chronic Kidney     Disease.   Anion gap 11  5 - 15  AMYLASE     Status: None    Collection Time    05/17/14  4:47 AM      Result Value Ref Range   Amylase 85  0 - 105 U/L   Comment: Performed at [place], BLOOD     Status: None   Collection Time    05/17/14  4:47 AM      Result Value Ref Range   Lipase 14  11 - 59 U/L   Comment: Performed at [place], ED     Status: None   Collection Time    05/17/14  6:51 AM      Result Value Ref Range   Troponin i, poc 0.00  0.00 - 0.08 ng/mL   Comment 3            Comment: Due to the release kinetics of cTnI,     a negative result within the first hours     of the onset of symptoms does not rule out     myocardial infarction with certainty.     If myocardial infarction is still suspected,     repeat the test at appropriate intervals.   Ct Abdomen Pelvis W Contrast  05/17/2014   CLINICAL DATA:  Diffuse abdominal pain.  Leukocytosis.  Vomiting.  EXAM: CT ABDOMEN AND PELVIS WITH CONTRAST  TECHNIQUE: Multidetector CT imaging of the abdomen and pelvis was performed using the standard protocol following bolus administration of intravenous contrast.  CONTRAST:  146m OMNIPAQUE IOHEXOL 300 MG/ML SOLN, 570mOMNIPAQUE IOHEXOL 300 MG/ML SOLN  COMPARISON:  None.  FINDINGS: Liver:  No mass or other parenchymal abnormality identified.  Gallbladder/Biliary:  Unremarkable.  Pancreas: No mass, inflammatory changes, or other parenchymal abnormality identified.  Spleen:  Within normal limits in size and appearance.  Adrenal Glands:  No mass identified.  Kidneys/Urinary Tract: No masses identified. No evidence of hydronephrosis.  Lymph Nodes:  No pathologically enlarged lymph nodes identified.  Bowel: A large appendicolith is seen at the base the appendix. Appendix is dilated to 18 mm in greatest diameter. Mild periappendiceal inflammatory changes are seen. A small amount of free fluid is seen in the pelvic cul-de-sac, however no abscess is identified. No evidence of bowel obstruction.  Pelvic/Reproductive  Organs: No mass or other significant abnormality identified.  Vascular:  No evidence of abdominal aortic aneurysm.  Musculoskeletal:  No suspicious bone lesions identified.  Other:  None.  IMPRESSION: Positive for acute appendicitis. Small amount of free fluid in pelvic cul-de-sac, however no abscess identified.   Electronically Signed   By: JoEarle Gell.D.   On: 05/17/2014 09:11   Assessment/Plan 1. Acute appendicitis  Plan: 1. We'll get the patient admitted and placed her on Rocephin and Flagyl. She will remain n.p.o. We will plan on taking her to the operating room for a laparoscopic appendectomy. This has been discussed with the patient. She is agreeable with this plan.  OSBORNE,KELLY E 05/17/2014, 10:06 AM Pager: [phone] with above. I discussed with the patient the indications and risks of appendiceal surgery.  The primary risks of appendiceal surgery include, but are not limited to, bleeding, infection, bowel surgery, and open surgery.  There is also the risk that the patient may have continued symptoms after surgery.  However, the likelihood of improvement  in symptoms and return to the patient's normal status is good. We discussed the typical post-operative recovery course. I tried to answer the patient's questions.  Alphonsa Overall, MD, [facility] Surgery Pager: [phone] Office phone:  [phone]'

## 2014-05-17 NOTE — Op Note (Signed)
Re:   Kristine Perez DOB:   Apr 25, 1991 MRN:   [id number]                   [facility]  DATE OF PROCEDURE: 05/17/2014                              OPERATIVE REPORT  PREOPERATIVE DIAGNOSIS:  Appendicitis  POSTOPERATIVE DIAGNOSIS:  Acute appendicitis (distended appendix)  PROCEDURE:  Laparoscopic appendectomy.  SURGEON:  Sandria Balesavid H. Ezzard StandingNewman, MD  ASSISTANT:  No first assistant.  ANESTHESIA:  General endotracheal.  Anesthesiologist: Gaetano Hawthorneharles L Ewell, MD CRNA: Illene SilverJanet E Evans, CRNA; Young BerryMargaret A Flynn-Cook  ASA:  1E  ESTIMATED BLOOD LOSS:  Minimal.  DRAINS: none   SPECIMEN:   Appendix  COUNTS CORRECT:  YES  INDICATIONS FOR PROCEDURE: Kristine Loanne DrillingL Perez is a [age] y.o. (DOB: Apr 25, 1991) AA  female whose primary care doctor is Default, Provider, MD and comes to the OR for an appendectomy.   I discussed with the patient, the indications and potential complications of appendiceal surgery.  The potential complications include, but are not limited to, bleeding, open surgery, bowel resection, and the possibility of another diagnosis.  OPERATIVE NOTE:  The patient underwent a general endotracheal anesthetic as supervised by Anesthesiologist: Gaetano Hawthorneharles L Ewell, MD CRNA: Illene SilverJanet E Evans, CRNA; Young BerryMargaret A Flynn-Cook, General, in room #1.  The patient was given Rocephin and Flagyl prior to the procedure and the abdomen was prepped with ChloraPrep.  We did not place a foley.  A time-out was held and surgical checklist run.  An infraumbilical incision was made with sharp dissection carried down to the abdominal cavity.  An 12 mm Hasson trocar was inserted through the infraumbilical incision and into the peritoneal cavity.  A 0 degree 10 mm laparoscope was inserted through a 12 mm Hasson trocar and the Hasson trocar secured with a 0 Vicryl suture.  I placed a 5 mm trocar in the right upper quadrant and 5 mm torcar in left lower quadrant and did abdominal exploration.    The right and left lobes of liver  unremarkable.  Stomach was unremarkable.  The pelvic organs were unremarkable.  I saw no other intra-abdominal abnormality.  The patient had appendicitis with the appendix located medially at the base of the small bowel mesentery at the pelvic brim.  The appendix was distended and inflamed.  The mesentery of the appendix was divided with a Harmonic scalpel.  I got to the base of the appendix.  I then used a blue load 45 mm Ethicon Endo-GIA stapler and fired this across the base of the appendix.  I placed the appendix in EndoCatch bag and delivered the bag through the umbilical incision.  I irrigated the abdomen with 900 cc of saline.  After irrigating the abdomen, I then removed the trocars, in turn.  The umbilical port fascia was closed with 0 Vicryl suture.   I closed the skin each site with a 5-0 Monocryl suture and painted the wounds with Dermabond.  I then injected a total of 30 mL of 0.25% Marcaine at the incisions.  Sponge and needle count were correct at the end of the case.  The foley catheter was removed in the OR.  The patient was transferred to the recovery room in good condition.  The patient tolerated the procedure well and it depends on the patient's post op clinical course as to when the patient could  be discharged.   Ovidio Kin, MD, [facility] Surgery Pager: [phone] Office phone:  [phone]

## 2014-05-17 NOTE — Progress Notes (Signed)
[facility] Community Liaison [name],  [place] with patient about community resources. Patient stated had insurance through job, just waiting on card to come in the mail.

## 2014-05-17 NOTE — ED Notes (Signed)
Pt transported to CT ?

## 2014-05-17 NOTE — MAU Provider Note (Signed)
Attestation of Attending Supervision of Advanced Practitioner (CNM/NP): Evaluation and management procedures were performed by the Advanced Practitioner under my supervision and collaboration. I have reviewed the Advanced Practitioner's note and chart, and I agree with the management and plan.  Shana Younge H. 6:22 AM   

## 2014-05-17 NOTE — ED Notes (Signed)
[place] RN from [place] transporting [place] to [place].

## 2014-05-17 NOTE — ED Provider Notes (Signed)
CSN: [id number]     Arrival date & time 05/17/14  0358 History   First MD Initiated Contact with Patient 05/17/14 [phone]     Chief Complaint  Patient presents with  . Abdominal Pain     (Consider location/radiation/quality/duration/timing/severity/associated sxs/prior Treatment) HPI  Patient to the ER transferred from [facility] requesting CT abd/pelv or US.   The patient reports that she has had diffuse abdominal pain for the past three weeks that is severe. The pain is worse RUQ, LUQ and periumbilical. She reports seeking treatment today for this because she developed episodes of vomitus. It was food, non bloody and non bilious. She denies diarrhea or constipation. She has not had vaginal bleeding or vaginal discharge. [facility] she has had a negative Troponin, and normal CMP, amylase, lipase, neg urine preg, her CBC shows 14.2 WBC sound, and urinalysis shows moderate leukocytes.    She had a c-section 8 months ago but otherwise has an unremarkable PMH. She reports that it is constant with intermittent shooting pains that go into her back, down into bilateral legs and sometimes up into her chest.  Past Medical History  Diagnosis Date  . Headache(784.0)     migraines  . Anemia    Past Surgical History  Procedure Laterality Date  . Abscess      under arm  . Cesarean section N/A 09/03/2013    Procedure: Primary CESAREAN SECTION with delivery of baby girl @1010 ;  Surgeon: Antionette CharLisa Jackson-Moore, MD;  Location: [facility];  Service: Obstetrics;  Laterality: N/A;  wound class clean-contaminated   Family History  Problem Relation Age of Onset  . Hypertension Mother   . Mental illness Mother   . Diabetes Maternal Grandmother   . Heart disease Maternal Grandmother   . Diabetes Paternal Grandmother   . Hypertension Paternal Grandmother   . Cancer Paternal Grandmother    History  Substance Use Topics  . Smoking status: Never Smoker   . Smokeless tobacco: Never Used  . Alcohol  Use: No   OB History   Grav Para Term Preterm Abortions TAB SAB Ect Mult Living   1 1 1  0 0 0 0 0 0 1     Review of Systems   Review of Systems  Gen: no weight loss, fevers, chills, night sweats  Eyes: no discharge or drainage, no occular pain or visual changes  Nose: no epistaxis or rhinorrhea  Mouth: no dental pain, no sore throat  Neck: no neck pain  Lungs:No wheezing or hemoptysis No coughing CV:  No palpitations, dependent edema or orthopnea. No chest pain Abd: no diarrhea. + nausea or vomiting, + abdominal pain  GU: no dysuria or gross hematuria  MSK:  No muscle weakness, No  pain Neuro: no headache, no focal neurologic deficits  Skin: no rash , no wounds Psyche: no complaints    Allergies  Review of patient's allergies indicates no known allergies.  Home Medications   Prior to Admission medications   Not on File   BP 134/75  Pulse 93  Temp(Src) 98.8 F (37.1 C) (Oral)  Resp 16  Ht 5\' 7"  (1.702 m)  Wt 194 lb 6.4 oz (88.179 kg)  BMI 30.44 kg/m2  SpO2 97%  LMP 05/04/2014  Breastfeeding? No Physical Exam  Nursing note and vitals reviewed. Constitutional: She appears well-developed and well-nourished. No distress.  HENT:  Head: Normocephalic and atraumatic.  Eyes: Pupils are equal, round, and reactive to light.  Neck: Normal range of motion. Neck supple.  Cardiovascular: Normal rate and regular rhythm.   Pulmonary/Chest: Effort normal.  Abdominal: Soft. Bowel sounds are normal. She exhibits no distension and no fluid wave. There is tenderness (diffuse and severe). There is guarding (voluntary). There is no rigidity, no rebound and no CVA tenderness. No hernia.  Neurological: She is alert.  Skin: Skin is warm and dry.      ED Course  Procedures (including critical care time) Labs Review Labs Reviewed  URINALYSIS, ROUTINE W REFLEX MICROSCOPIC - Abnormal; Notable for the following:    Leukocytes, UA MODERATE (*)    All other components within normal  limits  CBC WITH DIFFERENTIAL - Abnormal; Notable for the following:    WBC 14.2 (*)    HCT 35.6 (*)    MCV 76.4 (*)    MCH 25.8 (*)    Neutrophils Relative % 83 (*)    Neutro Abs 11.9 (*)    All other components within normal limits  COMPREHENSIVE METABOLIC PANEL - Abnormal; Notable for the following:    Glucose, Bld 126 (*)    Total Protein 8.6 (*)    All other components within normal limits  URINE MICROSCOPIC-ADD ON - Abnormal; Notable for the following:    Squamous Epithelial / LPF FEW (*)    Bacteria, UA FEW (*)    All other components within normal limits  AMYLASE  LIPASE, BLOOD  POCT PREGNANCY, URINE  I-STAT TROPOININ, ED    Imaging Review Ct Abdomen Pelvis W Contrast  05/17/2014   CLINICAL DATA:  Diffuse abdominal pain.  Leukocytosis.  Vomiting.  EXAM: CT ABDOMEN AND PELVIS WITH CONTRAST  TECHNIQUE: Multidetector CT imaging of the abdomen and pelvis was performed using the standard protocol following bolus administration of intravenous contrast.  CONTRAST:  OMNIPAQUE IOHEXOL 300 MG/ML SOLN, 50mL OMNIPAQUE IOHEXOL 300 MG/ML SOLN  COMPARISON:  None.  FINDINGS: Liver:  No mass or other parenchymal abnormality identified.  Gallbladder/Biliary:  Unremarkable.  Pancreas: No mass, inflammatory changes, or other parenchymal abnormality identified.  Spleen:  Within normal limits in size and appearance.  Adrenal Glands:  No mass identified.  Kidneys/Urinary Tract: No masses identified. No evidence of hydronephrosis.  Lymph Nodes:  No pathologically enlarged lymph nodes identified.  Bowel: A large appendicolith is seen at the base the appendix. Appendix is dilated to 18 mm in greatest diameter. Mild periappendiceal inflammatory changes are seen. A small amount of free fluid is seen in the pelvic cul-de-sac, however no abscess is identified. No evidence of bowel obstruction.  Pelvic/Reproductive Organs: No mass or other significant abnormality identified.  Vascular:  No evidence of  abdominal aortic aneurysm.  Musculoskeletal:  No suspicious bone lesions identified.  Other:  None.  IMPRESSION: Positive for acute appendicitis. Small amount of free fluid in pelvic cul-de-sac, however no abscess identified.   Electronically Signed   By: Myles Rosenthal M.D.   On: 05/17/2014 09:11     EKG Interpretation None     Results for DEVANI, ODONNEL (MRN [id number]) as of 05/17/2014 06:57  Ref. Range 05/17/2014 04:47  Sodium Latest Range: 137-147 mEq/L 137  Potassium Latest Range: 3.7-5.3 mEq/L 4.2  Chloride Latest Range: 96-112 mEq/L 100  CO2 Latest Range: 19-32 mEq/L 26  BUN Latest Range: 6-23 mg/dL 12  Creatinine Latest Range: 0.50-1.10 mg/dL 4.09  Calcium Latest Range: 8.4-10.5 mg/dL 81.1  GFR calc non Af Amer Latest Range: >90 mL/min >90  GFR calc Af Amer Latest Range: >90 mL/min >90  Glucose Latest Range: 70-99 mg/dL 914 (  H)  Anion gap Latest Range: 5-15  11  Alkaline Phosphatase Latest Range: 39-117 U/L 61  Albumin Latest Range: 3.5-5.2 g/dL 4.4  Amylase Latest Range: 0-105 U/L 85  Lipase Latest Range: 11-59 U/L 14  AST Latest Range: 0-37 U/L 11  ALT Latest Range: 0-35 U/L 9  Total Protein Latest Range: 6.0-8.3 g/dL 8.6 (H)  Total Bilirubin Latest Range: 0.3-1.2 mg/dL 0.3  WBC Latest Range: 4.0-10.5 K/uL 14.2 (H)  RBC Latest Range: 3.87-5.11 MIL/uL 4.66  Hemoglobin Latest Range: 12.0-15.0 g/dL 16.1  HCT Latest Range: 36.0-46.0 % 35.6 (L)  MCV Latest Range: 78.0-100.0 fL 76.4 (L)  MCH Latest Range: 26.0-34.0 pg 25.8 (L)  MCHC Latest Range: 30.0-36.0 g/dL 09.6  RDW Latest Range: 11.5-15.5 % 13.9  Platelets Latest Range: 150-400 K/uL 349  Neutrophils Relative % Latest Range: 43-77 % 83 (H)  Lymphocytes Relative Latest Range: 12-46 % 12  Monocytes Relative Latest Range: 3-12 % 5  Eosinophils Relative Latest Range: 0-5 % 0  Basophils Relative Latest Range: 0-1 % 0  NEUT# Latest Range: 1.7-7.7 K/uL 11.9 (H)  Lymphocytes Absolute Latest Range: 0.7-4.0 K/uL 1.7   Monocytes Absolute Latest Range: 0.1-1.0 K/uL 0.6  Eosinophils Absolute Latest Range: 0.0-0.7 K/uL 0.0  Basophils Absolute Latest Range: 0.0-0.1 K/uL 0.0   Results for SOLYMAR, GRACE (MRN [id number]) as of 05/17/2014 06:57  Ref. Range 05/17/2014 04:09  Preg Test, Ur Latest Range: NEGATIVE  NEGATIVE   Results for ELVERNA, CAFFEE (MRN [id number]) as of 05/17/2014 06:57  Ref. Range 05/17/2014 04:01  Color, Urine Latest Range: YELLOW  YELLOW  APPearance Latest Range: CLEAR  CLEAR  Specific Gravity, Urine Latest Range: 1.005-1.030  1.020  pH Latest Range: 5.0-8.0  6.0  Glucose Latest Range: NEGATIVE mg/dL NEGATIVE  Bilirubin Urine Latest Range: NEGATIVE  NEGATIVE  Ketones, ur Latest Range: NEGATIVE mg/dL NEGATIVE  Protein Latest Range: NEGATIVE mg/dL NEGATIVE  Urobilinogen, UA Latest Range: 0.0-1.0 mg/dL 0.2  Nitrite Latest Range: NEGATIVE  NEGATIVE  Leukocytes, UA Latest Range: NEGATIVE  MODERATE (A)  Hgb urine dipstick Latest Range: NEGATIVE  NEGATIVE  Urine-Other No range found MUCOUS PRESENT  WBC, UA Latest Range: <3 WBC/hpf 3-6  RBC / HPF Latest Range: <3 RBC/hpf 0-2  Squamous Epithelial / LPF Latest Range: RARE  FEW (A)  Bacteria, UA Latest Range: RARE  FEW (A)   MDM   Final diagnoses:  Acute appendicitis with generalized peritonitis    Patients CT has resulted showing acute appendicitis. Pain difficult to manage in the ED. Gen Surg consulted.  9:50 am Surgery has seen patient and will admit. Filed Vitals:   05/17/14 0941  BP: 126/68  Pulse: 95  Temp: 98.8 F (37.1 C)  Resp: [address] PA-C 05/17/14 [phone]

## 2014-05-17 NOTE — ED Notes (Signed)
Bed: [id number] Expected date:  Expected time:  Means of arrival:  Comments: EMS, [facility]

## 2014-05-17 NOTE — MAU Provider Note (Signed)
History     CSN: [id number]  Arrival date and time: 05/17/14 4098   First Provider Initiated Contact with Patient 05/17/14 0425      Chief Complaint  Patient presents with  . Abdominal Pain  . Emesis  . Dizziness   HPI Ms. Kristine Perez is a 23 y.o. G1P1001 who presents to MAU today with complaint of upper abdominal pain x 2 weeks. She states N/V since last night. She denies diarrhea, sick contacts of fever. She states that the pain is sharp and constant.   OB History   Grav Para Term Preterm Abortions TAB SAB Ect Mult Living   1 1 1  0 0 0 0 0 0 1      Past Medical History  Diagnosis Date  . Headache(784.0)     migraines  . Anemia     Past Surgical History  Procedure Laterality Date  . Abscess      under arm  . Cesarean section N/A 09/03/2013    Procedure: Primary CESAREAN SECTION with delivery of baby girl @1010 ;  Surgeon: Antionette Char, MD;  Location: [facility];  Service: Obstetrics;  Laterality: N/A;  wound class clean-contaminated    Family History  Problem Relation Age of Onset  . Hypertension Mother   . Mental illness Mother   . Diabetes Maternal Grandmother   . Heart disease Maternal Grandmother   . Diabetes Paternal Grandmother   . Hypertension Paternal Grandmother   . Cancer Paternal Grandmother     History  Substance Use Topics  . Smoking status: Never Smoker   . Smokeless tobacco: Never Used  . Alcohol Use: No    Allergies: No Known Allergies  No prescriptions prior to admission    Review of Systems  Constitutional: Negative for fever and malaise/fatigue.  Gastrointestinal: Positive for nausea, vomiting and abdominal pain. Negative for diarrhea and constipation.  Genitourinary: Negative for dysuria, urgency and frequency.       Neg - vaginal bleeding   Physical Exam   Blood pressure 127/61, pulse 111, temperature 99.6 F (37.6 C), temperature source Oral, resp. rate 22, height 5\' 7"  (1.702 m), weight 194 lb 6.4 oz (88.179 kg),  last menstrual period 04/03/2014, not currently breastfeeding.  Physical Exam  Constitutional: She is oriented to person, place, and time. She appears well-developed and well-nourished. No distress.  Appears uncomfortable  HENT:  Head: Normocephalic.  Cardiovascular: Tachycardia present.   Respiratory: Effort normal.  GI: Soft. She exhibits no distension and no mass. Bowel sounds are decreased. There is tenderness (very tender to palpation of the upper abdomen). There is guarding. There is no rebound.  Neurological: She is alert and oriented to person, place, and time.  Skin: Skin is warm and dry. No erythema.  Psychiatric: She has a normal mood and affect.   Results for orders placed during the hospital encounter of 05/17/14 (from the past 24 hour(s))  URINALYSIS, ROUTINE W REFLEX MICROSCOPIC     Status: Abnormal   Collection Time    05/17/14  4:01 AM      Result Value Ref Range   Color, Urine YELLOW  YELLOW   APPearance CLEAR  CLEAR   Specific Gravity, Urine 1.020  1.005 - 1.030   pH 6.0  5.0 - 8.0   Glucose, UA NEGATIVE  NEGATIVE mg/dL   Hgb urine dipstick NEGATIVE  NEGATIVE   Bilirubin Urine NEGATIVE  NEGATIVE   Ketones, ur NEGATIVE  NEGATIVE mg/dL   Protein, ur NEGATIVE  NEGATIVE mg/dL  Urobilinogen, UA 0.2  0.0 - 1.0 mg/dL   Nitrite NEGATIVE  NEGATIVE   Leukocytes, UA MODERATE (*) NEGATIVE  URINE MICROSCOPIC-ADD ON     Status: Abnormal   Collection Time    05/17/14  4:01 AM      Result Value Ref Range   Squamous Epithelial / LPF FEW (*) RARE   WBC, UA 3-6  <3 WBC/hpf   RBC / HPF 0-2  <3 RBC/hpf   Bacteria, UA FEW (*) RARE   Urine-Other MUCOUS PRESENT    POCT PREGNANCY, URINE     Status: None   Collection Time    05/17/14  4:09 AM      Result Value Ref Range   Preg Test, Ur NEGATIVE  NEGATIVE  CBC WITH DIFFERENTIAL     Status: Abnormal   Collection Time    05/17/14  4:47 AM      Result Value Ref Range   WBC 14.2 (*) 4.0 - 10.5 K/uL   RBC 4.66  3.87 - 5.11  MIL/uL   Hemoglobin 12.0  12.0 - 15.0 g/dL   HCT 16.1 (*) 09.6 - 04.5 %   MCV 76.4 (*) 78.0 - 100.0 fL   MCH 25.8 (*) 26.0 - 34.0 pg   MCHC 33.7  30.0 - 36.0 g/dL   RDW 40.9  81.1 - 91.4 %   Platelets 349  150 - 400 K/uL   Neutrophils Relative % 83 (*) 43 - 77 %   Neutro Abs 11.9 (*) 1.7 - 7.7 K/uL   Lymphocytes Relative 12  12 - 46 %   Lymphs Abs 1.7  0.7 - 4.0 K/uL   Monocytes Relative 5  3 - 12 %   Monocytes Absolute 0.6  0.1 - 1.0 K/uL   Eosinophils Relative 0  0 - 5 %   Eosinophils Absolute 0.0  0.0 - 0.7 K/uL   Basophils Relative 0  0 - 1 %   Basophils Absolute 0.0  0.0 - 0.1 K/uL  COMPREHENSIVE METABOLIC PANEL     Status: Abnormal   Collection Time    05/17/14  4:47 AM      Result Value Ref Range   Sodium 137  137 - 147 mEq/L   Potassium 4.2  3.7 - 5.3 mEq/L   Chloride 100  96 - 112 mEq/L   CO2 26  19 - 32 mEq/L   Glucose, Bld 126 (*) 70 - 99 mg/dL   BUN 12  6 - 23 mg/dL   Creatinine, Ser 7.82  0.50 - 1.10 mg/dL   Calcium 95.6  8.4 - 21.3 mg/dL   Total Protein 8.6 (*) 6.0 - 8.3 g/dL   Albumin 4.4  3.5 - 5.2 g/dL   AST 11  0 - 37 U/L   ALT 9  0 - 35 U/L   Alkaline Phosphatase 61  39 - 117 U/L   Total Bilirubin 0.3  0.3 - 1.2 mg/dL   GFR calc non Af Amer >90  >90 mL/min   GFR calc Af Amer >90  >90 mL/min   Anion gap 11  5 - 15    MAU Course  Procedures None  MDM UPT - negative UA, CBC, CMP, Amylase and Lipase today 1 liter IV bolus LR with 1 mg Dilaudid and 4 mg Zofran Amylase and Lipase pending Discussed patient with Dr. Read Drivers at [facility] He agrees that imaging is necessary. Accepts transfer to [facility] for further work-up.   Assessment and Plan  A: Abdominal pain Nausea  and vomiting  P: [facility] for further evaluation and management  Freddi StarrJulie N Ethier, PA-C  05/17/2014, 5:56 AM

## 2014-05-17 NOTE — ED Notes (Signed)
Surgical PA at bedside  

## 2014-05-17 NOTE — ED Notes (Addendum)
Pt returned from CT with complaint of worsening pain but decrease in nausea post medication administration. PA notified of pt current pain status.   Pt currently laying on bed with eyes closed; resting in NAD.

## 2014-05-17 NOTE — ED Provider Notes (Signed)
Medical screening examination/treatment/procedure(s) were performed by non-physician practitioner and as supervising physician I was immediately available for consultation/collaboration.   EKG Interpretation None        Musa Rewerts H Tenley Winward, MD 05/17/14 1002 

## 2014-05-17 NOTE — Transfer of Care (Signed)
Immediate Anesthesia Transfer of Care Note  Patient: Kristine Perez  Procedure(s) Performed: Procedure(s): APPENDECTOMY LAPAROSCOPIC (N/A)  Patient Location: PACU  Anesthesia Type:General  Level of Consciousness: awake, alert , oriented and patient cooperative  Airway & Oxygen Therapy: Patient Spontanous Breathing and Patient connected to face mask oxygen  Post-op Assessment: Report given to PACU RN, Post -op Vital signs reviewed and stable and Patient moving all extremities X 4  Post vital signs: stable  Complications: No apparent anesthesia complications 

## 2014-05-17 NOTE — ED Notes (Signed)
Pt reports increase in nausea unable to drink much contrast. See MAR.

## 2014-05-17 NOTE — Anesthesia Preprocedure Evaluation (Signed)
Anesthesia Evaluation  Patient identified by MRN, date of birth, ID band Patient awake    Reviewed: Allergy & Precautions, H&P , NPO status , Patient's Chart, lab work & pertinent test results  Airway Mallampati: II TM Distance: >3 FB Neck ROM: full    Dental no notable dental hx. (+) Teeth Intact, Dental Advisory Given   Pulmonary neg pulmonary ROS,  breath sounds clear to auscultation  Pulmonary exam normal       Cardiovascular Exercise Tolerance: Good negative cardio ROS  Rhythm:regular Rate:Normal     Neuro/Psych negative neurological ROS  negative psych ROS   GI/Hepatic negative GI ROS, Neg liver ROS,   Endo/Other  negative endocrine ROS  Renal/GU negative Renal ROS  negative genitourinary   Musculoskeletal   Abdominal   Peds  Hematology negative hematology ROS (+)   Anesthesia Other Findings   Reproductive/Obstetrics negative OB ROS                          Anesthesia Physical Anesthesia Plan  ASA: I  Anesthesia Plan: General   Post-op Pain Management:    Induction: Intravenous, Rapid sequence and Cricoid pressure planned  Airway Management Planned: Oral ETT  Additional Equipment:   Intra-op Plan:   Post-operative Plan: Extubation in OR  Informed Consent: I have reviewed the patients History and Physical, chart, labs and discussed the procedure including the risks, benefits and alternatives for the proposed anesthesia with the patient or authorized representative who has indicated his/her understanding and acceptance.   Dental Advisory Given  Plan Discussed with: CRNA and Surgeon  Anesthesia Plan Comments:         Anesthesia Quick Evaluation  

## 2014-05-17 NOTE — Transfer of Care (Signed)
Immediate Anesthesia Transfer of Care Note  Patient: Kristine Perez DrillingL Poon  Procedure(s) Performed: Procedure(s): APPENDECTOMY LAPAROSCOPIC (N/A)  Patient Location: PACU  Anesthesia Type:General  Level of Consciousness: awake, alert , oriented and patient cooperative  Airway & Oxygen Therapy: Patient Spontanous Breathing and Patient connected to face mask oxygen  Post-op Assessment: Report given to PACU RN, Post -op Vital signs reviewed and stable and Patient moving all extremities X 4  Post vital signs: stable  Complications: No apparent anesthesia complications

## 2014-05-17 NOTE — ED Notes (Signed)
[facility] with UQ ABD pain.

## 2014-05-18 MED ORDER — HYDROCODONE-ACETAMINOPHEN 5-325 MG PO TABS: 1.0000 | ORAL_TABLET | ORAL | Status: DC | PRN

## 2014-05-18 NOTE — Progress Notes (Signed)
Patient ID: Kristine Perez, female   DOB: September 20, 1991, 23 y.o.   MRN: [id number] 1 Day Post-Op  Subjective: Complaining of some nausea, dizziness when getting up and trying to walk with chest tightness and some abdominal pain although this is better than before surgery.  Objective: Vital signs in last 24 hours: Temp:  [98.4 F (36.9 C)-99 F (37.2 C)] 98.6 F (37 C) (08/15 0540) Pulse Rate:  [67-96] 67 (08/15 0540) Resp:  [14-20] 18 (08/15 0540) BP: (114-141)/(41-74) 123/57 mmHg (08/15 0540) SpO2:  [96 %-100 %] 100 % (08/15 0540) Last BM Date: 05/17/14  Intake/Output from previous day: 08/14 0701 - 08/15 0700 In: 3915.4 [P.O.:600; I.V.:3165.4; IV Piggyback:150] Out: 3200 [Urine:3200] Intake/Output this shift: Total I/O In: -  Out: 600 [Urine:600]  General appearance: alert, cooperative and no distress Resp: clear to auscultation bilaterally GI: mild diffuse tenderness. Incision/Wound: clean and dry  Lab Results:   Recent Labs  05/17/14 0447  WBC 14.2*  HGB 12.0  HCT 35.6*  PLT 349   BMET  Recent Labs  05/17/14 0447  NA 137  K 4.2  CL 100  CO2 26  GLUCOSE 126*  BUN 12  CREATININE 0.75  CALCIUM 10.0     Studies/Results: Ct Abdomen Pelvis W Contrast  05/17/2014   CLINICAL DATA:  Diffuse abdominal pain.  Leukocytosis.  Vomiting.  EXAM: CT ABDOMEN AND PELVIS WITH CONTRAST  TECHNIQUE: Multidetector CT imaging of the abdomen and pelvis was performed using the standard protocol following bolus administration of intravenous contrast.  CONTRAST:  100mL OMNIPAQUE IOHEXOL 300 MG/ML SOLN, 50mL OMNIPAQUE IOHEXOL 300 MG/ML SOLN  COMPARISON:  None.  FINDINGS: Liver:  No mass or other parenchymal abnormality identified.  Gallbladder/Biliary:  Unremarkable.  Pancreas: No mass, inflammatory changes, or other parenchymal abnormality identified.  Spleen:  Within normal limits in size and appearance.  Adrenal Glands:  No mass identified.  Kidneys/Urinary Tract: No masses  identified. No evidence of hydronephrosis.  Lymph Nodes:  No pathologically enlarged lymph nodes identified.  Bowel: A large appendicolith is seen at the base the appendix. Appendix is dilated to 18 mm in greatest diameter. Mild periappendiceal inflammatory changes are seen. A small amount of free fluid is seen in the pelvic cul-de-sac, however no abscess is identified. No evidence of bowel obstruction.  Pelvic/Reproductive Organs: No mass or other significant abnormality identified.  Vascular:  No evidence of abdominal aortic aneurysm.  Musculoskeletal:  No suspicious bone lesions identified.  Other:  None.  IMPRESSION: Positive for acute appendicitis. Small amount of free fluid in pelvic cul-de-sac, however no abscess identified.   Electronically Signed   By: Myles RosenthalJohn  Stahl M.D.   On: 05/17/2014 09:11    Anti-infectives: Anti-infectives   Start     Dose/Rate Route Frequency Ordered Stop   05/17/14 1030  cefTRIAXone (ROCEPHIN) 2 g in dextrose 5 % 50 mL IVPB     2 g 100 mL/hr over 30 Minutes Intravenous Every 24 hours 05/17/14 1005     05/17/14 1030  metroNIDAZOLE (FLAGYL) IVPB 500 mg     500 mg 100 mL/hr over 60 Minutes Intravenous Every 8 hours 05/17/14 1005        Assessment/Plan: s/p Procedure(s): APPENDECTOMY LAPAROSCOPIC Doing okay. Doubt complication but does not feel well enough for discharge at this point. Observe for now. CBC in a.m.   LOS: 1 day    Kinslie Hove T 05/18/2014

## 2014-05-18 NOTE — Progress Notes (Signed)
Utilization Review Completed.Kristine Perez T8/15/2015  

## 2014-05-19 LAB — CBC
HEMATOCRIT: 29.5 % — AB (ref 36.0–46.0)
Hemoglobin: 9.6 g/dL — ABNORMAL LOW (ref 12.0–15.0)
MCH: 25.1 pg — ABNORMAL LOW (ref 26.0–34.0)
MCHC: 32.5 g/dL (ref 30.0–36.0)
MCV: 77 fL — ABNORMAL LOW (ref 78.0–100.0)
Platelets: 269 10*3/uL (ref 150–400)
RBC: 3.83 MIL/uL — ABNORMAL LOW (ref 3.87–5.11)
RDW: 13.9 % (ref 11.5–15.5)
WBC: 6.5 10*3/uL (ref 4.0–10.5)

## 2014-05-19 MED ORDER — OXYCODONE-ACETAMINOPHEN 5-325 MG PO TABS: 1.0000 | ORAL_TABLET | ORAL | Status: DC | PRN

## 2014-05-19 MED ORDER — OXYCODONE-ACETAMINOPHEN 5-325 MG PO TABS
1.0000 | ORAL_TABLET | ORAL | Status: DC | PRN
  Administered 2014-05-19: 2 via ORAL
  Filled 2014-05-19: qty 2

## 2014-05-19 NOTE — Discharge Instructions (Signed)
[facility] ______[facility] °LAPAROSCOPIC SURGERY: POST OP INSTRUCTIONS °Always review your discharge instruction sheet given to you by the facility where your surgery was performed. °IF YOU HAVE DISABILITY OR FAMILY LEAVE FORMS, YOU MUST BRING THEM TO THE OFFICE FOR PROCESSING.   °DO NOT GIVE THEM TO YOUR DOCTOR. ° °1. A prescription for pain medication may be given to you upon discharge.  Take your pain medication as prescribed, if needed.  If narcotic pain medicine is not needed, then you may take acetaminophen (Tylenol) or ibuprofen (Advil) as needed. °2. Take your usually prescribed medications unless otherwise directed. °3. If you need a refill on your pain medication, please contact your pharmacy.  They will contact our office to request authorization. Prescriptions will not be filled after 5pm or on week-ends. °4. You should follow a light diet the first few days after arrival home, such as soup and crackers, etc.  Be sure to include lots of fluids daily. °5. Most patients will experience some swelling and bruising in the area of the incisions.  Ice packs will help.  Swelling and bruising can take several days to resolve.  °6. It is common to experience some constipation if taking pain medication after surgery.  Increasing fluid intake and taking a stool softener (such as Colace) will usually help or prevent this problem from occurring.  A mild laxative (Milk of Magnesia or Miralax) should be taken according to package instructions if there are no bowel movements after 48 hours. °7. Unless discharge instructions indicate otherwise, you may remove your bandages 24-48 hours after surgery, and you may shower at that time.  You may have steri-strips (small skin tapes) in place directly over the incision.  These strips should be left on the skin for 7-10 days.  If your surgeon used skin glue on the incision, you may shower in 24 hours.  The glue will flake off over the next 2-3 weeks.  Any sutures or  staples will be removed at the office during your follow-up visit. °8. ACTIVITIES:  You may resume regular (light) daily activities beginning the next day--such as daily self-care, walking, climbing stairs--gradually increasing activities as tolerated.  You may have sexual intercourse when it is comfortable.  Refrain from any heavy lifting or straining until approved by your doctor. °a. You may drive when you are no longer taking prescription pain medication, you can comfortably wear a seatbelt, and you can safely maneuver your car and apply brakes. °b. RETURN TO WORK:  __________________________________________________________ °9. You should see your doctor in the office for a follow-up appointment approximately 2-3 weeks after your surgery.  Make sure that you call for this appointment within a day or two after you arrive home to insure a convenient appointment time. °10. OTHER INSTRUCTIONS: __________________________________________________________________________________________________________________________ __________________________________________________________________________________________________________________________ °WHEN TO CALL YOUR DOCTOR: °1. Fever over 101.0 °2. Inability to urinate °3. Continued bleeding from incision. °4. Increased pain, redness, or drainage from the incision. °5. Increasing abdominal pain ° °The clinic staff is available to answer your questions during regular business hours.  Please don’t hesitate to call and ask to speak to one of the nurses for clinical concerns.  If you have a medical emergency, go to the nearest emergency room or call 911.  A surgeon from [facility] is always on call at the hospital. °[address], [address], [place], [place]  [address] ? [address] [address], [place], [place]   [address] °[phone] ? [phone] ? FAX [phone] °Web site:   [email or web address] °

## 2014-05-19 NOTE — Progress Notes (Signed)
Patient ID: Kristine Perez, female   DOB: [birth year], 23 y.o.   MRN: [id number] 2 Days Post-Op  Subjective: Some pain around the umbilical incision. Overall feels better. Tolerating fluids. Eating a little solid food but not much appetite.  Objective: Vital signs in last 24 hours: Temp:  [98 F (36.7 C)-98.8 F (37.1 C)] 98 F (36.7 C) (08/16 0635) Pulse Rate:  [64-78] 69 (08/16 0635) Resp:  [16-18] 18 (08/16 0635) BP: (91-124)/(48-76) 91/57 mmHg (08/16 0635) SpO2:  [99 %-100 %] 99 % (08/16 0635) Last BM Date: 05/17/14  Intake/Output from previous day: 08/15 0701 - 08/16 0700 In: 4871.7 [P.O.:2280; I.V.:2241.7; IV Piggyback:350] Out: 3650 [Urine:3650] Intake/Output this shift:    General appearance: alert, cooperative and no distress GI: some appropriate tenderness around the umbilical incision only rest of abdomen soft and nontender. Incision/Wound: clean and dry without signs of infection  Lab Results:   Recent Labs  05/17/14 0447 05/19/14 0544  WBC 14.2* 6.5  HGB 12.0 9.6*  HCT 35.6* 29.5*  PLT 349 269   BMET  Recent Labs  05/17/14 0447  NA 137  K 4.2  CL 100  CO2 26  GLUCOSE 126*  BUN 12  CREATININE 0.75  CALCIUM 10.0     Studies/Results: Ct Abdomen Pelvis W Contrast  05/17/2014   CLINICAL DATA:  Diffuse abdominal pain.  Leukocytosis.  Vomiting.  EXAM: CT ABDOMEN AND PELVIS WITH CONTRAST  TECHNIQUE: Multidetector CT imaging of the abdomen and pelvis was performed using the standard protocol following bolus administration of intravenous contrast.  CONTRAST:  100mL OMNIPAQUE IOHEXOL 300 MG/ML SOLN, 50mL OMNIPAQUE IOHEXOL 300 MG/ML SOLN  COMPARISON:  None.  FINDINGS: Liver:  No mass or other parenchymal abnormality identified.  Gallbladder/Biliary:  Unremarkable.  Pancreas: No mass, inflammatory changes, or other parenchymal abnormality identified.  Spleen:  Within normal limits in size and appearance.  Adrenal Glands:  No mass identified.  Kidneys/Urinary  Tract: No masses identified. No evidence of hydronephrosis.  Lymph Nodes:  No pathologically enlarged lymph nodes identified.  Bowel: A large appendicolith is seen at the base the appendix. Appendix is dilated to 18 mm in greatest diameter. Mild periappendiceal inflammatory changes are seen. A small amount of free fluid is seen in the pelvic cul-de-sac, however no abscess is identified. No evidence of bowel obstruction.  Pelvic/Reproductive Organs: No mass or other significant abnormality identified.  Vascular:  No evidence of abdominal aortic aneurysm.  Musculoskeletal:  No suspicious bone lesions identified.  Other:  None.  IMPRESSION: Positive for acute appendicitis. Small amount of free fluid in pelvic cul-de-sac, however no abscess identified.   Electronically Signed   By: Myles RosenthalJohn  Stahl M.D.   On: 05/17/2014 09:11    Anti-infectives: Anti-infectives   Start     Dose/Rate Route Frequency Ordered Stop   05/17/14 1030  cefTRIAXone (ROCEPHIN) 2 g in dextrose 5 % 50 mL IVPB     2 g 100 mL/hr over 30 Minutes Intravenous Every 24 hours 05/17/14 1005     05/17/14 1030  metroNIDAZOLE (FLAGYL) IVPB 500 mg     500 mg 100 mL/hr over 60 Minutes Intravenous Every 8 hours 05/17/14 1005        Assessment/Plan: s/p Procedure(s): APPENDECTOMY LAPAROSCOPIC Stable postop without apparent complication. Okay for discharge.   LOS: 2 days    Jaeshaun Riva T 05/19/2014

## 2014-05-19 NOTE — Anesthesia Postprocedure Evaluation (Signed)
  Anesthesia Post-op Note  Patient: Kristine Perez  Procedure(s) Performed: Procedure(s) (LRB): APPENDECTOMY LAPAROSCOPIC (N/A)  Patient Location: PACU  Anesthesia Type: General  Level of Consciousness: awake and alert   Airway and Oxygen Therapy: Patient Spontanous Breathing  Post-op Pain: mild  Post-op Assessment: Post-op Vital signs reviewed, Patient's Cardiovascular Status Stable, Respiratory Function Stable, Patent Airway and No signs of Nausea or vomiting  Last Vitals:  Filed Vitals:   05/19/14 0635  BP: 91/57  Pulse: 69  Temp: 36.7 C  Resp: 18    Post-op Vital Signs: stable   Complications: No apparent anesthesia complications

## 2014-05-20 ENCOUNTER — Encounter (HOSPITAL_COMMUNITY): Payer: Self-pay | Admitting: Surgery

## 2014-05-23 ENCOUNTER — Encounter (INDEPENDENT_AMBULATORY_CARE_PROVIDER_SITE_OTHER): Payer: Self-pay

## 2014-06-05 ENCOUNTER — Encounter (INDEPENDENT_AMBULATORY_CARE_PROVIDER_SITE_OTHER): Payer: Self-pay | Admitting: Surgery

## 2014-06-07 NOTE — Discharge Summary (Signed)
Patient ID: Kristine Perez MRN: [id number] DOB/AGE: [birth year] 23 y.o.  Admit date: 05/17/2014 Discharge date: 05/19/2014  Procedures: lap appy  Consults: None  Reason for Admission: This is a 23 year old black female who is about 8 months postpartum who developed some nonspecific abdominal pain for the last 3 weeks. This was minimal for the last couple weeks but became more intense yesterday. She noted right lower cautery abdominal pain. She then developed some nausea and vomiting overnight into this morning. She admits to some chills but no fevers. She denies any diarrhea. Because of persistent pain, she presented to [facility] emergency department for further evaluation. She has a white blood cell count of 14,000. She has a CT scan that reveals acute appendicitis with some fluid in the pelvic cul-de-sac and no evidence of abscess or definitive rupture. We have been asked to evaluate the patient for admission  Admission Diagnoses:  1. Acute appendicitis  Hospital Course: THe patient was admitted and taken to the OR where she underwent a lap appy.  She tolerated this well.  She was having some nausea on POD 1 and was kept for symptom control.  By POD 2, she was tolerating a regular diet and her pain was well controlled.  She was stable for dc home.  Discharge Diagnoses:  Principal Problem:   Acute appendicitis Active Problems:   Appendicitis s/p lap appy  Discharge Medications:   Medication List         oxyCODONE-acetaminophen 5-325 MG per tablet  Commonly known as:  PERCOCET/ROXICET  Take 1-2 tablets by mouth every 4 (four) hours as needed for moderate pain.        Discharge Instructions:     Follow-up Information   Follow up with [facility], MD. Schedule an appointment as soon as possible for a visit in 2 weeks.   Specialty:  General Surgery   Contact information:   [address] [place] [address] [phone]       Signed: Letha Cape 06/07/2014, 2:34 PM

## 2014-06-21 ENCOUNTER — Encounter (INDEPENDENT_AMBULATORY_CARE_PROVIDER_SITE_OTHER): Payer: Self-pay | Admitting: Surgery

## 2014-08-05 ENCOUNTER — Encounter (HOSPITAL_COMMUNITY): Payer: Self-pay | Admitting: Surgery

## 2014-09-24 ENCOUNTER — Encounter (HOSPITAL_COMMUNITY): Payer: Self-pay | Admitting: Cardiology

## 2014-09-24 ENCOUNTER — Emergency Department (HOSPITAL_COMMUNITY)
Admission: EM | Admit: 2014-09-24 | Discharge: 2014-09-24 | Disposition: A | Discharge status: Home / Self Care | Payer: BC Managed Care – PPO | Attending: Emergency Medicine | Admitting: Emergency Medicine

## 2014-09-24 DIAGNOSIS — Z8679 Personal history of other diseases of the circulatory system: Secondary | ICD-10-CM | POA: Diagnosis not present

## 2014-09-24 DIAGNOSIS — J0100 Acute maxillary sinusitis, unspecified: Secondary | ICD-10-CM | POA: Diagnosis not present

## 2014-09-24 DIAGNOSIS — Z862 Personal history of diseases of the blood and blood-forming organs and certain disorders involving the immune mechanism: Secondary | ICD-10-CM | POA: Diagnosis not present

## 2014-09-24 DIAGNOSIS — H9209 Otalgia, unspecified ear: Secondary | ICD-10-CM | POA: Insufficient documentation

## 2014-09-24 DIAGNOSIS — R0981 Nasal congestion: Secondary | ICD-10-CM | POA: Diagnosis present

## 2014-09-24 LAB — RAPID STREP SCREEN (MED CTR MEBANE ONLY): Streptococcus, Group A Screen (Direct): NEGATIVE

## 2014-09-24 MED ORDER — AMOXICILLIN 500 MG PO CAPS: 500.0000 mg | ORAL_CAPSULE | Freq: Three times a day (TID) | ORAL | Status: DC

## 2014-09-24 MED ORDER — DEXTROMETHORPHAN POLISTIREX 30 MG/5ML PO LQCR: 30.0000 mg | ORAL | Status: DC | PRN

## 2014-09-24 NOTE — ED Provider Notes (Signed)
CSN: [id number]     Arrival date & time 09/24/14  1246 History   First MD Initiated Contact with Patient 09/24/14 1303     Chief Complaint  Patient presents with  . Cough  . Nasal Congestion   Patient is a 23 y.o. female presenting with cough. The history is provided by the patient. No language interpreter was used.  Cough Associated symptoms: chills, ear pain, fever, headaches and sore throat   This chart was scribed for non-physician practitioner Emilia BeckKaitlyn Jalisa Sacco, PA-C,  working with Geoffery Lyonsouglas Delo, MD, by Andrew Auaven Small, ED Scribe. This patient was seen in room TR05C/TR05C and the patient's care was started at 1:50 PM.  Kristine Perez is a 23 y.o. female who presents to the Emergency Department complaining of cough that began 5 days ago. Pt reports symptoms began to worsen  2 days ago with fever, chills, sinus congestion, sinus pressure, postnasal drip, otalgia, hoarseness, and a sore throat. Pt denies taking OTC medication for symptoms.  Pt reports exposure to the flu from a coworkers. Pt works at a call center   Past Medical History  Diagnosis Date  . Headache(784.0)     migraines  . Anemia    Past Surgical History  Procedure Laterality Date  . Abscess      under arm  . Cesarean section N/A 09/03/2013    Procedure: Primary CESAREAN SECTION with delivery of baby girl @1010 ;  Surgeon: Antionette CharLisa Jackson-Moore, MD;  Location: [facility];  Service: Obstetrics;  Laterality: N/A;  wound class clean-contaminated  . Laparoscopic appendectomy N/A 05/17/2014    Procedure: APPENDECTOMY LAPAROSCOPIC;  Surgeon: Kandis Cockingavid H Newman, MD;  Location: [facility];  Service: General;  Laterality: N/A;   Family History  Problem Relation Age of Onset  . Hypertension Mother   . Mental illness Mother   . Diabetes Maternal Grandmother   . Heart disease Maternal Grandmother   . Diabetes Paternal Grandmother   . Hypertension Paternal Grandmother   . Cancer Paternal Grandmother    History  Substance Use Topics  .  Smoking status: Never Smoker   . Smokeless tobacco: Never Used  . Alcohol Use: Yes     Comment: occasional   OB History    Gravida Para Term Preterm AB TAB SAB Ectopic Multiple Living   1 1 1  0 0 0 0 0 0 1     Review of Systems  Constitutional: Positive for fever and chills.  HENT: Positive for congestion, ear pain, postnasal drip, sinus pressure, sore throat and voice change.   Respiratory: Positive for cough.   Neurological: Positive for headaches.   Allergies  Review of patient's allergies indicates no known allergies.  Home Medications   Prior to Admission medications   Medication Sig Start Date End Date Taking? Authorizing Provider  oxyCODONE-acetaminophen (PERCOCET/ROXICET) 5-325 MG per tablet Take 1-2 tablets by mouth every 4 (four) hours as needed for moderate pain. Patient not taking: Reported on 09/24/2014 05/19/14   Glenna FellowsBenjamin Hoxworth, MD   BP 122/67 mmHg  Pulse 92  Temp(Src) 98.4 F (36.9 C) (Oral)  Resp 18  Wt 194 lb (87.998 kg)  SpO2 99%  LMP 09/09/2014 Physical Exam  Constitutional: She is oriented to person, place, and time. She appears well-developed and well-nourished. No distress.  HENT:  Head: Normocephalic and atraumatic.  Mouth/Throat: Oropharynx is clear and moist. No oropharyngeal exudate.  Maxillary sinus tenderness to palpation.   Eyes: Conjunctivae and EOM are normal.  Neck: Normal range of motion. Neck supple.  Cardiovascular: Normal rate.   Pulmonary/Chest: Effort normal and breath sounds normal. No respiratory distress. She has no wheezes. She has no rales. She exhibits no tenderness.  Abdominal: Soft. She exhibits no distension. There is no tenderness. There is no rebound.  Musculoskeletal: Normal range of motion.  Neurological: She is alert and oriented to person, place, and time. Coordination normal.  Skin: Skin is warm and dry.  Psychiatric: She has a normal mood and affect. Her behavior is normal.  Nursing note and vitals  reviewed.   ED Course  Procedures (including critical care time) DIAGNOSTIC STUDIES: Oxygen Saturation is 99% on RA, normal by my interpretation.    COORDINATION OF CARE: 2:22 PM- Pt advised of plan for treatment which includes an abx and pt agrees.  Labs Review Labs Reviewed  RAPID STREP SCREEN  CULTURE, GROUP A STREP   Results for orders placed or performed during the hospital encounter of 09/24/14  Rapid strep screen  Result Value Ref Range   Streptococcus, Group A Screen (Direct) NEGATIVE NEGATIVE   Imaging Review No results found.   EKG Interpretation None      MDM   Final diagnoses:  Acute maxillary sinusitis, recurrence not specified    2:27 PM Patient's rapid strep negative. Patient likely has acute sinusitis and will be treated with amoxicillin. Patient will have delsym for cough. Vitals stable and patient afebrile.   I personally performed the services described in this documentation, which was scribed in my presence. The recorded information has been reviewed and is accurate.    Emilia BeckKaitlyn Krystine Pabst, PA-C 09/24/14 1428  Geoffery Lyonsouglas Delo, MD 09/25/14 [phone]

## 2014-09-24 NOTE — ED Notes (Signed)
Cough, sore throat, nasal congestion x 5 days.

## 2014-09-24 NOTE — Discharge Instructions (Signed)
Take amoxicillin as directed until gone. Take delsym as needed for cough. Refer to attached documents for more information.  °

## 2014-09-24 NOTE — ED Notes (Signed)
Pt reports a cough and cold for the past couple of days. States that her co-workers have had the flu.

## 2014-09-26 LAB — CULTURE, GROUP A STREP

## 2014-12-18 ENCOUNTER — Encounter (HOSPITAL_COMMUNITY): Payer: Self-pay | Admitting: Emergency Medicine

## 2014-12-18 DIAGNOSIS — J040 Acute laryngitis: Secondary | ICD-10-CM | POA: Insufficient documentation

## 2014-12-18 DIAGNOSIS — H9203 Otalgia, bilateral: Secondary | ICD-10-CM | POA: Insufficient documentation

## 2014-12-18 DIAGNOSIS — B349 Viral infection, unspecified: Secondary | ICD-10-CM | POA: Insufficient documentation

## 2014-12-18 DIAGNOSIS — Z862 Personal history of diseases of the blood and blood-forming organs and certain disorders involving the immune mechanism: Secondary | ICD-10-CM | POA: Insufficient documentation

## 2014-12-18 NOTE — ED Notes (Signed)
C/o sore throat, bilateral ear pain, and productive cough with green sputum x 1 1/2 weeks.

## 2014-12-19 ENCOUNTER — Emergency Department (HOSPITAL_COMMUNITY)
Admission: EM | Admit: 2014-12-19 | Discharge: 2014-12-19 | Disposition: A | Discharge status: Home / Self Care | Payer: Medicaid Other | Attending: Emergency Medicine | Admitting: Emergency Medicine

## 2014-12-19 DIAGNOSIS — B349 Viral infection, unspecified: Secondary | ICD-10-CM

## 2014-12-19 DIAGNOSIS — J040 Acute laryngitis: Secondary | ICD-10-CM

## 2014-12-19 MED ORDER — PSEUDOEPHEDRINE HCL 60 MG PO TABS: 60.0000 mg | ORAL_TABLET | ORAL | Status: DC | PRN

## 2014-12-19 MED ORDER — BENZONATATE 100 MG PO CAPS: 100.0000 mg | ORAL_CAPSULE | Freq: Three times a day (TID) | ORAL | Status: DC

## 2014-12-19 MED ORDER — NAPROXEN 500 MG PO TABS: 500.0000 mg | ORAL_TABLET | Freq: Two times a day (BID) | ORAL | Status: DC

## 2014-12-19 NOTE — Discharge Instructions (Signed)
Please read and follow all provided instructions.  Your diagnoses today include:  1. Viral syndrome   2. Laryngitis    You appear to have an upper respiratory infection (URI). An upper respiratory tract infection, or cold, is a viral infection of the air passages leading to the lungs. It should improve gradually after 5-7 days. You may have a lingering cough that lasts for 2- 4 weeks after the infection.  Tests performed today include:  Vital signs. See below for your results today.   Medications prescribed:   Tessalon Perles - cough suppressant medication   Naproxen - anti-inflammatory pain medication  Do not exceed 500mg  naproxen every 12 hours, take with food  You have been prescribed an anti-inflammatory medication or NSAID. Take with food. Take smallest effective dose for the shortest duration needed for your pain. Stop taking if you experience stomach pain or vomiting.    Pseudoephedrine - medication for congestion  Take any prescribed medications only as directed. Treatment for your infection is aimed at treating the symptoms. There are no medications, such as antibiotics, that will cure your infection.   Home care instructions:  Follow any educational materials contained in this packet.   Your illness is contagious and can be spread to others, especially during the first 3 or 4 days. It cannot be cured by antibiotics or other medicines. Take basic precautions such as washing your hands often, covering your mouth when you cough or sneeze, and avoiding public places where you could spread your illness to others.   Please continue drinking plenty of fluids.  Use over-the-counter medicines as needed as directed on packaging for symptom relief.  You may also use ibuprofen or tylenol as directed on packaging for pain or fever.  Do not take multiple medicines containing Tylenol or acetaminophen to avoid taking too much of this medication.  Follow-up instructions: Please follow-up  with your primary care provider in the next 3 days for further evaluation of your symptoms if you are not feeling better.   Return instructions:   Please return to the Emergency Department if you experience worsening symptoms.   RETURN IMMEDIATELY IF you develop shortness of breath, confusion or altered mental status, a new rash, become dizzy, faint, or poorly responsive, or are unable to be cared for at home.  Please return if you have persistent vomiting and cannot keep down fluids or develop a fever that is not controlled by tylenol or motrin.    Please return if you have any other emergent concerns.  Additional Information:  Your vital signs today were: BP 122/75 mmHg   Pulse 65   Temp(Src) 98.3 F (36.8 C) (Oral)   Resp 18   Ht 5\' 7"  (1.702 m)   Wt 203 lb (92.08 kg)   BMI 31.79 kg/m2   SpO2 99%   LMP 12/17/2014 If your blood pressure (BP) was elevated above 135/85 this visit, please have this repeated by your doctor within one month. --------------

## 2014-12-19 NOTE — ED Provider Notes (Signed)
CSN: [id number]     Arrival date & time 12/18/14  2340 History   First MD Initiated Contact with Patient 12/19/14 0036     Chief Complaint  Patient presents with  . Sore Throat  . Otalgia     (Consider location/radiation/quality/duration/timing/severity/associated sxs/prior Treatment) HPI Comments: Patient presents with complaint of pain and itchiness in bilateral ears, nasal congestion, sore throat, cough productive of green sputum, hoarse voice for the past 9 days. She states that she has had intermittent fever to 102F over the past 3 days. She has tried some over-the-counter medications with temporary relief of symptoms. No nausea, vomiting, or diarrhea. No chest pain or abdominal pain. No wheezing. No history of asthma. No known sick contacts. 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 She is having difficulty working because of losing her voice. Onset of symptoms acute. Course is persistent. Nothing makes symptoms worse.  The history is provided by the patient.    Past Medical History  Diagnosis Date  . Headache(784.0)     migraines  . Anemia    Past Surgical History  Procedure Laterality Date  . Abscess      under arm  . Cesarean section N/A 09/03/2013    Procedure: Primary CESAREAN SECTION with delivery of baby girl @1010 ;  Surgeon: Antionette CharLisa Jackson-Moore, MD;  Location: [facility];  Service: Obstetrics;  Laterality: N/A;  wound class clean-contaminated  . Laparoscopic appendectomy N/A 05/17/2014    Procedure: APPENDECTOMY LAPAROSCOPIC;  Surgeon: Kandis Cockingavid H Newman, MD;  Location: [facility];  Service: General;  Laterality: N/A;   Family History  Problem Relation Age of Onset  . Hypertension Mother   . Mental illness Mother   . Diabetes Maternal Grandmother   . Heart disease Maternal Grandmother   . Diabetes Paternal Grandmother   . Hypertension Paternal Grandmother   . Cancer Paternal Grandmother     History  Substance Use Topics  . Smoking status: Never Smoker   . Smokeless tobacco: Never Used  . Alcohol Use: Yes     Comment: occasional   OB History    Gravida Para Term Preterm AB TAB SAB Ectopic Multiple Living   1 1 1  0 0 0 0 0 0 1     Review of Systems  All other systems reviewed and are negative.     Allergies  Review of patient's allergies indicates no known allergies.  Home Medications   Prior to Admission medications   Medication Sig Start Date End Date Taking? Authorizing Provider  benzonatate (TESSALON) 100 MG capsule Take 1 capsule (100 mg total) by mouth every 8 (eight) hours. 12/19/14   Renne CriglerJoshua Evart Mcdonnell, PA-C  naproxen (NAPROSYN) 500 MG tablet Take 1 tablet (500 mg total) by mouth 2 (two) times daily. 12/19/14   Renne CriglerJoshua Marsel Gail, PA-C  pseudoephedrine (SUDAFED) 60 MG tablet Take 1 tablet (60 mg total) by mouth every 4 (four) hours as needed for congestion. 12/19/14   Renne CriglerJoshua Nichlas Pitera, PA-C   BP 122/75 mmHg  Pulse 65  Temp(Src) 98.3 F (36.8 C) (Oral)  Resp 18  Ht 5\' 7"  (1.702 m)  Wt 203 lb (92.08 kg)  BMI 31.79 kg/m2  SpO2 99%  LMP 12/17/2014   Physical Exam  Constitutional: She appears well-developed and well-nourished.  HENT:  Head: Normocephalic and atraumatic.  Right Ear: Tympanic membrane, external ear and ear canal normal.  Left Ear: Tympanic membrane, external ear and ear  canal normal.  Nose: Nose normal. No mucosal edema or rhinorrhea.  Mouth/Throat: Uvula is midline and mucous membranes are normal. Mucous membranes are not dry. No oral lesions. No trismus in the jaw. No uvula swelling. Posterior oropharyngeal erythema present. No oropharyngeal exudate, posterior oropharyngeal edema or tonsillar abscesses.  Eyes: Conjunctivae are normal. Right eye exhibits no discharge. Left eye exhibits no discharge.  Neck: Normal range of motion. Neck supple.  Cardiovascular: Normal rate, regular rhythm and normal heart sounds.   No murmur heard. Pulmonary/Chest:  Effort normal and breath sounds normal. No respiratory distress. She has no wheezes. She has no rales.  Abdominal: Soft. There is no tenderness. There is no rebound and no guarding.  Lymphadenopathy:    She has no cervical adenopathy.  Neurological: She is alert.  Skin: Skin is warm and dry.  Psychiatric: She has a normal mood and affect.  Nursing note and vitals reviewed.   ED Course  Procedures (including critical care time) Labs Review Labs Reviewed - No data to display  Imaging Review No results found.   EKG Interpretation None       1:01 AM Patient seen and examined.    Vital signs reviewed and are as follows: BP 122/75 mmHg  Pulse 65  Temp(Src) 98.3 F (36.8 C) (Oral)  Resp 18  Ht  (1.702 m)  Wt 203 lb (92.08 kg)  BMI 31.79 kg/m2  SpO2 99%  LMP 12/17/2014  Patient counseled on supportive care for viral URI and s/s to return including worsening symptoms, persistent fever, persistent vomiting, or if they have any other concerns. Urged to see PCP if symptoms persist for more than 3 days. Patient verbalizes understanding and agrees with plan.    MDM   Final diagnoses:  Viral syndrome  Laryngitis   Patient with symptoms consistent with a viral syndrome. Vitals are stable, no fever. No signs of dehydration. Lung exam normal, no signs of pneumonia. Supportive therapy indicated with return if symptoms worsen.      Renne Crigler, PA-C 12/19/14 0120  Azalia Bilis, MD 12/19/14 [phone]

## 2015-05-13 ENCOUNTER — Emergency Department (HOSPITAL_COMMUNITY)
Admission: EM | Admit: 2015-05-13 | Discharge: 2015-05-13 | Disposition: A | Discharge status: Home / Self Care | Payer: Medicaid Other | Attending: Emergency Medicine | Admitting: Emergency Medicine

## 2015-05-13 ENCOUNTER — Encounter (HOSPITAL_COMMUNITY): Payer: Self-pay | Admitting: *Deleted

## 2015-05-13 DIAGNOSIS — S0590XA Unspecified injury of unspecified eye and orbit, initial encounter: Secondary | ICD-10-CM | POA: Insufficient documentation

## 2015-05-13 DIAGNOSIS — Z862 Personal history of diseases of the blood and blood-forming organs and certain disorders involving the immune mechanism: Secondary | ICD-10-CM | POA: Insufficient documentation

## 2015-05-13 DIAGNOSIS — Z79899 Other long term (current) drug therapy: Secondary | ICD-10-CM | POA: Insufficient documentation

## 2015-05-13 DIAGNOSIS — H5711 Ocular pain, right eye: Secondary | ICD-10-CM | POA: Insufficient documentation

## 2015-05-13 HISTORY — DX: Unspecified injury of unspecified eye and orbit, initial encounter: S05.90XA

## 2015-05-13 MED ORDER — POLYMYXIN B-TRIMETHOPRIM 10000-0.1 UNIT/ML-% OP SOLN: 1.0000 [drp] | OPHTHALMIC | Status: DC
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
## 2015-05-13 NOTE — Discharge Instructions (Signed)
Take the prescribed medication as directed. Follow-up with your eye doctor. Return to the ED for new or worsening symptoms.

## 2015-05-13 NOTE — ED Notes (Signed)
PT reports waking up this AM with swelling to RT lower lid. Pt reports pain to be 8/10

## 2015-05-13 NOTE — ED Notes (Signed)
Declined W/C at D/C and was escorted to lobby by RN. 
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
## 2015-05-17 ENCOUNTER — Emergency Department (HOSPITAL_COMMUNITY): Payer: Medicaid Other

## 2015-05-17 ENCOUNTER — Encounter (HOSPITAL_COMMUNITY): Payer: Self-pay

## 2015-05-17 DIAGNOSIS — D509 Iron deficiency anemia, unspecified: Secondary | ICD-10-CM | POA: Insufficient documentation

## 2015-05-17 DIAGNOSIS — R0602 Shortness of breath: Secondary | ICD-10-CM | POA: Insufficient documentation

## 2015-05-17 DIAGNOSIS — Z791 Long term (current) use of non-steroidal anti-inflammatories (NSAID): Secondary | ICD-10-CM | POA: Insufficient documentation

## 2015-05-17 DIAGNOSIS — Z87828 Personal history of other (healed) physical injury and trauma: Secondary | ICD-10-CM | POA: Insufficient documentation

## 2015-05-17 DIAGNOSIS — R0789 Other chest pain: Secondary | ICD-10-CM | POA: Insufficient documentation

## 2015-05-17 LAB — CBC
HCT: 33 % — ABNORMAL LOW (ref 36.0–46.0)
HEMOGLOBIN: 10.7 g/dL — AB (ref 12.0–15.0)
MCH: 24.5 pg — ABNORMAL LOW (ref 26.0–34.0)
MCHC: 32.4 g/dL (ref 30.0–36.0)
MCV: 75.7 fL — AB (ref 78.0–100.0)
Platelets: 320 10*3/uL (ref 150–400)
RBC: 4.36 MIL/uL (ref 3.87–5.11)
RDW: 15 % (ref 11.5–15.5)
WBC: 7 10*3/uL (ref 4.0–10.5)

## 2015-05-17 LAB — BASIC METABOLIC PANEL
Anion gap: 9 (ref 5–15)
BUN: 12 mg/dL (ref 6–20)
CO2: 24 mmol/L (ref 22–32)
Calcium: 9.3 mg/dL (ref 8.9–10.3)
Chloride: 106 mmol/L (ref 101–111)
Creatinine, Ser: 0.95 mg/dL (ref 0.44–1.00)
GFR calc Af Amer: >60 mL/min (ref >60)
GFR calc non Af Amer: >60 mL/min (ref >60)
GLUCOSE: 109 mg/dL — AB (ref 65–99)
POTASSIUM: 4 mmol/L (ref 3.5–5.1)
Sodium: 139 mmol/L (ref 135–145)

## 2015-05-17 LAB — I-STAT TROPONIN, ED: Troponin i, poc: 0 ng/mL (ref 0.00–0.08)

## 2015-05-17 NOTE — ED Notes (Signed)
Onset 1 hour left chest pain when taking breaths and dizziness.  No N/V.  No recent illness.

## 2015-05-18 ENCOUNTER — Emergency Department (HOSPITAL_COMMUNITY)
Admission: EM | Admit: 2015-05-18 | Discharge: 2015-05-18 | Disposition: A | Discharge status: Home / Self Care | Payer: Medicaid Other | Attending: Emergency Medicine | Admitting: Emergency Medicine

## 2015-05-18 DIAGNOSIS — R0789 Other chest pain: Secondary | ICD-10-CM

## 2015-05-18 DIAGNOSIS — D509 Iron deficiency anemia, unspecified: Secondary | ICD-10-CM

## 2015-05-18 LAB — D-DIMER, QUANTITATIVE: D-Dimer, Quant: 0.3 ug/mL-FEU (ref 0.00–0.48)

## 2015-05-18 MED ORDER — NAPROXEN 500 MG PO TABS: 500.0000 mg | ORAL_TABLET | Freq: Two times a day (BID) | ORAL | Status: DC

## 2015-05-18 MED ORDER — KETOROLAC TROMETHAMINE 30 MG/ML IJ SOLN
30.0000 mg | Freq: Once | INTRAMUSCULAR | Status: AC
  Administered 2015-05-18: 30 mg via INTRAVENOUS
  Filled 2015-05-18: qty 1

## 2015-05-18 MED ORDER — TRAMADOL HCL 50 MG PO TABS: 50.0000 mg | ORAL_TABLET | Freq: Four times a day (QID) | ORAL | Status: DC | PRN

## 2015-05-18 NOTE — Discharge Instructions (Signed)
Return if symptoms are getting worse.  Chest Wall Pain Chest wall pain is pain in or around the bones and muscles of your chest. It may take up to 6 weeks to get better. It may take longer if you must stay physically active in your work and activities.  CAUSES  Chest wall pain may happen on its own. However, it may be caused by:  A viral illness like the flu.  Injury.  Coughing.  Exercise.  Arthritis.  Fibromyalgia.  Shingles. HOME CARE INSTRUCTIONS   Avoid overtiring physical activity. Try not to strain or perform activities that cause pain. This includes any activities using your chest or your abdominal and side muscles, especially if heavy weights are used.  Put ice on the sore area.  Put ice in a plastic bag.  Place a towel between your skin and the bag.  Leave the ice on for 15-20 minutes per hour while awake for the first 2 days.  Only take over-the-counter or prescription medicines for pain, discomfort, or fever as directed by your caregiver. SEEK IMMEDIATE MEDICAL CARE IF:   Your pain increases, or you are very uncomfortable.  You have a fever.  Your chest pain becomes worse.  You have new, unexplained symptoms.  You have nausea or vomiting.  You feel sweaty or lightheaded.  You have a cough with phlegm (sputum), or you cough up blood. MAKE SURE YOU:   Understand these instructions.  Will watch your condition.  Will get help right away if you are not doing well or get worse. Document Released: 09/20/2005 Document Revised: 12/13/2011 Document Reviewed: 05/17/2011 [facility] Patient Information 2015 [place], [place]. This information is not intended to replace advice given to you by your health care provider. Make sure you discuss any questions you have with your health care provider.  Naproxen and naproxen sodium oral immediate-release tablets What is this medicine? NAPROXEN (na PROX en) is a non-steroidal anti-inflammatory drug (NSAID). It is used to  reduce swelling and to treat pain. This medicine may be used for dental pain, headache, or painful monthly periods. It is also used for painful joint and muscular problems such as arthritis, tendinitis, bursitis, and gout. This medicine may be used for other purposes; ask your health care provider or pharmacist if you have questions. COMMON BRAND NAME(S): Aflaxen, Aleve, Aleve Arthritis, All Day Relief, Anaprox, Anaprox DS, Naprosyn What should I tell my health care provider before I take this medicine? They need to know if you have any of these conditions: -asthma -cigarette smoker -drink more than 3 alcohol containing drinks a day -heart disease or circulation problems such as heart failure or leg edema (fluid retention) -high blood pressure -kidney disease -liver disease -stomach bleeding or ulcers -an unusual or allergic reaction to naproxen, aspirin, other NSAIDs, other medicines, foods, dyes, or preservatives -pregnant or trying to get pregnant -breast-feeding How should I use this medicine? Take this medicine by mouth with a glass of water. Follow the directions on the prescription label. Take it with food if your stomach gets upset. Try to not lie down for at least 10 minutes after you take it. Take your medicine at regular intervals. Do not take your medicine more often than directed. Long-term, continuous use may increase the risk of heart attack or stroke. A special MedGuide will be given to you by the pharmacist with each prescription and refill. Be sure to read this information carefully each time. Talk to your pediatrician regarding the use of this medicine in  children. Special care may be needed. Overdosage: If you think you have taken too much of this medicine contact a poison control center or emergency room at once. NOTE: This medicine is only for you. Do not share this medicine with others. What if I miss a dose? If you miss a dose, take it as soon as you can. If it is  almost time for your next dose, take only that dose. Do not take double or extra doses. What may interact with this medicine? -alcohol -aspirin -cidofovir -diuretics -lithium -methotrexate -other drugs for inflammation like ketorolac or prednisone -pemetrexed -probenecid -warfarin This list may not describe all possible interactions. Give your health care provider a list of all the medicines, herbs, non-prescription drugs, or dietary supplements you use. Also tell them if you smoke, drink alcohol, or use illegal drugs. Some items may interact with your medicine. What should I watch for while using this medicine? Tell your doctor or health care professional if your pain does not get better. Talk to your doctor before taking another medicine for pain. Do not treat yourself. This medicine does not prevent heart attack or stroke. In fact, this medicine may increase the chance of a heart attack or stroke. The chance may increase with longer use of this medicine and in people who have heart disease. If you take aspirin to prevent heart attack or stroke, talk with your doctor or health care professional. Do not take other medicines that contain aspirin, ibuprofen, or naproxen with this medicine. Side effects such as stomach upset, nausea, or ulcers may be more likely to occur. Many medicines available without a prescription should not be taken with this medicine. This medicine can cause ulcers and bleeding in the stomach and intestines at any time during treatment. Do not smoke cigarettes or drink alcohol. These increase irritation to your stomach and can make it more susceptible to damage from this medicine. Ulcers and bleeding can happen without warning symptoms and can cause death. You may get drowsy or dizzy. Do not drive, use machinery, or do anything that needs mental alertness until you know how this medicine affects you. Do not stand or sit up quickly, especially if you are an older patient. This  reduces the risk of dizzy or fainting spells. This medicine can cause you to bleed more easily. Try to avoid damage to your teeth and gums when you brush or floss your teeth. What side effects may I notice from receiving this medicine? Side effects that you should report to your doctor or health care professional as soon as possible: -black or bloody stools, blood in the urine or vomit -blurred vision -chest pain -difficulty breathing or wheezing -nausea or vomiting -severe stomach pain -skin rash, skin redness, blistering or peeling skin, hives, or itching -slurred speech or weakness on one side of the body -swelling of eyelids, throat, lips -unexplained weight gain or swelling -unusually weak or tired -yellowing of eyes or skin Side effects that usually do not require medical attention (report to your doctor or health care professional if they continue or are bothersome): -constipation -headache -heartburn This list may not describe all possible side effects. Call your doctor for medical advice about side effects. You may report side effects to FDA at 1-800-FDA-1088. Where should I keep my medicine? Keep out of the reach of children. Store at room temperature between 15 and 30 degrees C (59 and 86 degrees F). Keep container tightly closed. Throw away any unused medicine after the expiration date.  NOTE: This sheet is a summary. It may not cover all possible information. If you have questions about this medicine, talk to your doctor, pharmacist, or health care provider.  2015, Elsevier/Gold Standard. (2009-09-22 20:10:16)  Tramadol tablets What is this medicine? TRAMADOL (TRA ma dole) is a pain reliever. It is used to treat moderate to severe pain in adults. This medicine may be used for other purposes; ask your health care provider or pharmacist if you have questions. COMMON BRAND NAME(S): Ultram What should I tell my health care provider before I take this medicine? They need to  know if you have any of these conditions: -brain tumor -depression -drug abuse or addiction -head injury -if you frequently drink alcohol containing drinks -kidney disease or trouble passing urine -liver disease -lung disease, asthma, or breathing problems -seizures or epilepsy -suicidal thoughts, plans, or attempt; a previous suicide attempt by you or a family member -an unusual or allergic reaction to tramadol, codeine, other medicines, foods, dyes, or preservatives -pregnant or trying to get pregnant -breast-feeding How should I use this medicine? Take this medicine by mouth with a full glass of water. Follow the directions on the prescription label. If the medicine upsets your stomach, take it with food or milk. Do not take more medicine than you are told to take. Talk to your pediatrician regarding the use of this medicine in children. Special care may be needed. Overdosage: If you think you have taken too much of this medicine contact a poison control center or emergency room at once. NOTE: This medicine is only for you. Do not share this medicine with others. What if I miss a dose? If you miss a dose, take it as soon as you can. If it is almost time for your next dose, take only that dose. Do not take double or extra doses. What may interact with this medicine? Do not take this medicine with any of the following medications: -MAOIs like Carbex, Eldepryl, Marplan, Nardil, and Parnate This medicine may also interact with the following medications: -alcohol or medicines that contain alcohol -antihistamines -benzodiazepines -bupropion -carbamazepine or oxcarbazepine -clozapine -cyclobenzaprine -digoxin -furazolidone -linezolid -medicines for depression, anxiety, or psychotic disturbances -medicines for migraine headache like almotriptan, eletriptan, frovatriptan, naratriptan, rizatriptan, sumatriptan, zolmitriptan -medicines for pain like pentazocine, buprenorphine,  butorphanol, meperidine, nalbuphine, and propoxyphene -medicines for sleep -muscle relaxants -naltrexone -phenobarbital -phenothiazines like perphenazine, thioridazine, chlorpromazine, mesoridazine, fluphenazine, prochlorperazine, promazine, and trifluoperazine -procarbazine -warfarin This list may not describe all possible interactions. Give your health care provider a list of all the medicines, herbs, non-prescription drugs, or dietary supplements you use. Also tell them if you smoke, drink alcohol, or use illegal drugs. Some items may interact with your medicine. What should I watch for while using this medicine? Tell your doctor or health care professional if your pain does not go away, if it gets worse, or if you have new or a different type of pain. You may develop tolerance to the medicine. Tolerance means that you will need a higher dose of the medicine for pain relief. Tolerance is normal and is expected if you take this medicine for a long time. Do not suddenly stop taking your medicine because you may develop a severe reaction. Your body becomes used to the medicine. This does NOT mean you are addicted. Addiction is a behavior related to getting and using a drug for a non-medical reason. If you have pain, you have a medical reason to take pain medicine. Your doctor will tell you  how much medicine to take. If your doctor wants you to stop the medicine, the dose will be slowly lowered over time to avoid any side effects. You may get drowsy or dizzy. Do not drive, use machinery, or do anything that needs mental alertness until you know how this medicine affects you. Do not stand or sit up quickly, especially if you are an older patient. This reduces the risk of dizzy or fainting spells. Alcohol can increase or decrease the effects of this medicine. Avoid alcoholic drinks. You may have constipation. Try to have a bowel movement at least every 2 to 3 days. If you do not have a bowel movement for 3  days, call your doctor or health care professional. Your mouth may get dry. Chewing sugarless gum or sucking hard candy, and drinking plenty of water may help. Contact your doctor if the problem does not go away or is severe. What side effects may I notice from receiving this medicine? Side effects that you should report to your doctor or health care professional as soon as possible: -allergic reactions like skin rash, itching or hives, swelling of the face, lips, or tongue -breathing difficulties, wheezing -confusion -itching -light headedness or fainting spells -redness, blistering, peeling or loosening of the skin, including inside the mouth -seizures Side effects that usually do not require medical attention (report to your doctor or health care professional if they continue or are bothersome): -constipation -dizziness -drowsiness -headache -nausea, vomiting This list may not describe all possible side effects. Call your doctor for medical advice about side effects. You may report side effects to FDA at 1-800-FDA-1088. Where should I keep my medicine? Keep out of the reach of children. Store at room temperature between 15 and 30 degrees C (59 and 86 degrees F). Keep container tightly closed. Throw away any unused medicine after the expiration date. NOTE: This sheet is a summary. It may not cover all possible information. If you have questions about this medicine, talk to your doctor, pharmacist, or health care provider.  2015, [employer]vier/Gold Standard. (2010-06-03 11:55:44)
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
## 2015-05-18 NOTE — ED Notes (Signed)
Glick MD at bedside  

## 2015-06-16 ENCOUNTER — Emergency Department (HOSPITAL_COMMUNITY): Payer: Medicaid Other

## 2015-06-16 ENCOUNTER — Emergency Department (HOSPITAL_COMMUNITY)
Admission: EM | Admit: 2015-06-16 | Discharge: 2015-06-17 | Disposition: A | Discharge status: Home / Self Care | Payer: Medicaid Other | Attending: Emergency Medicine | Admitting: Emergency Medicine

## 2015-06-16 ENCOUNTER — Encounter (HOSPITAL_COMMUNITY): Payer: Self-pay | Admitting: Emergency Medicine

## 2015-06-16 DIAGNOSIS — R0789 Other chest pain: Secondary | ICD-10-CM

## 2015-06-16 DIAGNOSIS — Z87828 Personal history of other (healed) physical injury and trauma: Secondary | ICD-10-CM | POA: Insufficient documentation

## 2015-06-16 DIAGNOSIS — Z862 Personal history of diseases of the blood and blood-forming organs and certain disorders involving the immune mechanism: Secondary | ICD-10-CM | POA: Insufficient documentation

## 2015-06-16 DIAGNOSIS — Z8679 Personal history of other diseases of the circulatory system: Secondary | ICD-10-CM | POA: Insufficient documentation

## 2015-06-16 LAB — BASIC METABOLIC PANEL
ANION GAP: 6 (ref 5–15)
BUN: 11 mg/dL (ref 6–20)
CALCIUM: 9 mg/dL (ref 8.9–10.3)
CO2: 26 mmol/L (ref 22–32)
Chloride: 104 mmol/L (ref 101–111)
Creatinine, Ser: 0.7 mg/dL (ref 0.44–1.00)
GFR calc Af Amer: >60 mL/min (ref >60)
GLUCOSE: 95 mg/dL (ref 65–99)
Potassium: 4 mmol/L (ref 3.5–5.1)
SODIUM: 136 mmol/L (ref 135–145)

## 2015-06-16 LAB — CBC
HCT: 33.4 % — ABNORMAL LOW (ref 36.0–46.0)
Hemoglobin: 10.4 g/dL — ABNORMAL LOW (ref 12.0–15.0)
MCH: 23.6 pg — ABNORMAL LOW (ref 26.0–34.0)
MCHC: 31.1 g/dL (ref 30.0–36.0)
MCV: 75.9 fL — ABNORMAL LOW (ref 78.0–100.0)
Platelets: 326 10*3/uL (ref 150–400)
RBC: 4.4 MIL/uL (ref 3.87–5.11)
RDW: 14.2 % (ref 11.5–15.5)
WBC: 5.2 10*3/uL (ref 4.0–10.5)

## 2015-06-16 LAB — I-STAT TROPONIN, ED: TROPONIN I, POC: 0 ng/mL (ref 0.00–0.08)
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
## 2015-06-16 NOTE — ED Notes (Signed)
Patient here with complaint of left upper and lower chest pain. States recurrence of previous pain 1 month ago. Was seen and treated for about 1 month ago. Reports tramadol and steroid was given. Pain never went away and became worse yesterday.

## 2015-06-16 NOTE — ED Notes (Signed)
Pt reports she was seen last month for same pain she is here today for and told she had inflammation in between her ribs. Pt reports L sided chest and lateral chest pain that increases with deep inspiration and movement. Denies injury. Pt does not use birth control and denies recent travel.

## 2015-06-17 MED ORDER — KETOROLAC TROMETHAMINE 30 MG/ML IJ SOLN
60.0000 mg | Freq: Once | INTRAMUSCULAR | Status: AC
Start: 2015-06-17 — End: 2015-06-17
  Administered 2015-06-17: 60 mg via INTRAMUSCULAR
  Filled 2015-06-17: qty 2

## 2015-06-17 MED ORDER — TRAMADOL HCL 50 MG PO TABS
50.0000 mg | ORAL_TABLET | Freq: Four times a day (QID) | ORAL | Status: DC | PRN
Start: 2015-06-17 — End: 2016-10-29

## 2015-06-17 MED ORDER — NAPROXEN 500 MG PO TABS: 500.0000 mg | ORAL_TABLET | Freq: Two times a day (BID) | ORAL | Status: DC

## 2015-06-17 NOTE — Discharge Instructions (Signed)
Chest Wall Pain °Chest wall pain is pain in or around the bones and muscles of your chest. It may take up to 6 weeks to get better. It may take longer if you must stay physically active in your work and activities.  °CAUSES  °Chest wall pain may happen on its own. However, it may be caused by: °· A viral illness like the flu. °· Injury. °· Coughing. °· Exercise. °· Arthritis. °· Fibromyalgia. °· Shingles. °HOME CARE INSTRUCTIONS  °· Avoid overtiring physical activity. Try not to strain or perform activities that cause pain. This includes any activities using your chest or your abdominal and side muscles, especially if heavy weights are used. °· Put ice on the sore area. °¨ Put ice in a plastic bag. °¨ Place a towel between your skin and the bag. °¨ Leave the ice on for 15-20 minutes per hour while awake for the first 2 days. °· Only take over-the-counter or prescription medicines for pain, discomfort, or fever as directed by your caregiver. °SEEK IMMEDIATE MEDICAL CARE IF:  °· Your pain increases, or you are very uncomfortable. °· You have a fever. °· Your chest pain becomes worse. °· You have new, unexplained symptoms. °· You have nausea or vomiting. °· You feel sweaty or lightheaded. °· You have a cough with phlegm (sputum), or you cough up blood. °MAKE SURE YOU:  °· Understand these instructions. °· Will watch your condition. °· Will get help right away if you are not doing well or get worse. °Document Released: 09/20/2005 Document Revised: 12/13/2011 Document Reviewed: 05/17/2011 °ExitCare® Patient Information ©2015 [employer]. This information is not intended to replace advice given to you by your health care provider. Make sure you discuss any questions you have with your health care provider. ° ° °Emergency Department Resource Guide °1) Find a Doctor and Pay Out of Pocket °Although you won't have to find out who is covered by your insurance plan, it is a good idea to ask around and get recommendations. You  will then need to call the office and see if the doctor you have chosen will accept you as a new patient and what types of options they offer for patients who are self-pay. Some doctors offer discounts or will set up payment plans for their patients who do not have insurance, but you will need to ask so you aren't surprised when you get to your appointment. ° °2) Contact Your Local Health Department °Not all health departments have doctors that can see patients for sick visits, but many do, so it is worth a call to see if yours does. If you don't know where your local health department is, you can check in your phone book. The CDC also has a tool to help you locate your state's health department, and many state websites also have listings of all of their local health departments. ° °3) Find a Walk-in Clinic °If your illness is not likely to be very severe or complicated, you may want to try a walk in clinic. These are popping up all over the country in pharmacies, drugstores, and shopping centers. They're usually staffed by nurse practitioners or physician assistants that have been trained to treat common illnesses and complaints. They're usually fairly quick and inexpensive. However, if you have serious medical issues or chronic medical problems, these are probably not your best option. ° °No Primary Care Doctor: °- Call [facility] at  832-8000 - they can help you locate a primary care doctor that    accepts your insurance, provides certain services, etc. °- Physician Referral Service- 1-800-533-3463 ° °Chronic Pain Problems: °Organization         Address  Phone   Notes  °[facility]  (336) 297-2271 Patients need to be referred by their primary care doctor.  ° °Medication Assistance: °Organization         Address  Phone   Notes  °[place] [facility] [address] °[place], [place] [address] (336) 641-8030 --Must be a resident of [place] °-- Must  have NO insurance coverage whatsoever (no Medicaid/ Medicare, etc.) °-- The pt. MUST have a primary care doctor that directs their care regularly and follows them in the community °  °[facility]  (866) 331-1348   °[employer]  (888) 892-1162   ° °Agencies that provide inexpensive medical care: °Organization         Address  Phone   Notes  °[facility]  (336) 832-8035   °[facility]    (336) 832-7272   °[facility] [address] °[place], [place] [address] (336) 832-4777   °[facility] [address], °[place] (336) 271-4999   °[facility]    (336) 373-0678   °[facility]    (336) 272-1050   °[facility] ° [address], [place] Phone:  (336) 832-4444, Fax:  (336) 832-4440 Hours of Operation:  9 am - 6 pm, M-F.  Also accepts Medicaid/Medicare and self-pay.  °[facility] ° [address], [place] Phone: (336) 832-3150, Fax: (336) 832-3151. Hours of Operation:  8:30 am - 5:30 pm, M-F.  Also accepts Medicaid and self-pay.  °[facility] [place] [address], [place] Phone: (336) 878-6027   °[facility] [address], [place], [place] (336)723-1848, Ext. 123 Mondays & Thursdays: 7-9 AM.  First 15 patients are seen on a first come, first serve basis. °  ° °Medicaid-accepting [place] Providers: ° °Organization         Address  Phone   Notes  °[facility] [address], [place] (336) 641-2100 Also accepts self-pay patients.  °[facility] [address], [place] ° (336) 856-9996   °[facility] [address], [place] (336) 288-8857   °[facility] [address] [place] [address], [place] (336) 299-7000   °Veita Bland [address], [place]  ° (336) 373-1557 Only accepts [place] Access Medicaid patients after  they have their name applied to their card.  ° °Self-Pay (no insurance) in [place]: ° °Organization         Address  Phone   Notes  °Sickle Cell Patients, [facility] [address], [place] (336) 832-1970   °[facility] [address], [place] (336) 832-4400   ° [facility] ° [address] [place] [address],  (336) 992-4800   °[facility]/Dr. Osei-Bonsu ° [address] [place] [address], [place] or [address], [place] (336) 841-8500 Phone number for both [place] and [place] locations is the same.  °[facility] [address], [place] (336) 299-0000   °[facility] [address] [place] [address],  or [address] (336) 852-7530 °(336) 878-2260   °[facility] [address], [place] (336) 350-1642, phone; (336) 294-5005, fax Sees patients 1st and 3rd Saturday of every month.  Must not qualify for public or private insurance (i.e. Medicaid, Medicare, [place] Health Choice, Veterans' Benefits) • Household income should be no more than 200% of the poverty level •The clinic cannot treat you if you are pregnant or think you are pregnant • Sexually transmitted diseases are not treated at the clinic.  ° ° °Dental Care: °Organization         Address  Phone  Notes  °[place] [facility] [address], [place] (336) 641-6152 Accepts children up to age 21 who are enrolled in Medicaid or [place] Health Choice; pregnant women with a Medicaid card; and children who have applied for Medicaid or [place] Health Choice, but were declined, whose parents can pay a reduced fee at time of service.  °[facility] [place]  [address], [place] (336) 641-7733 Accepts children up to age 21 who are enrolled in Medicaid or [place] Health Choice; pregnant women with a Medicaid card; and children who  have applied for Medicaid or [place] Health Choice, but were declined, whose parents can pay a reduced fee at time of service.  °[facility] ° [address], [place] (336) 641-4533 Patients are seen by appointment only. Walk-ins are not accepted. [facility] will see patients 18 years of age and older. °Monday - Tuesday (8am-5pm) °Most Wednesdays (8:30-5pm) °$30 per visit, cash only  °[facility] ° [address], [place] (336) 641-4533 Patients are seen by appointment only. Walk-ins are not accepted. [facility] will see patients 18 years of age and older. °One Wednesday Evening (Monthly: Volunteer Based).  $30 per visit, cash only  °[facility]  (919) 537-3737 for adults; Children under age 4, call Graduate Pediatric Dentistry at (919) [phone]. Children aged 4-14, please call (919) 537-3737 to request a pediatric application. ° Dental services are provided in all areas of dental care including fillings, crowns and bridges, complete and partial dentures, implants, gum treatment, root canals, and extractions. Preventive care is also provided. Treatment is provided to both adults and children. °Patients are selected via a lottery and there is often a waiting list. °  °[facility] [address], °[place] ° (336) 763-8833 [email or web address].[email or web address] °  °[facility] [address], [place], [place] (336)723-1848, Ext. 123 Second and Fourth Thursday of each month, opens at 6:30 AM; Clinic ends at 9 AM.  Patients are seen on a first-come first-served basis, and a limited number are seen during each clinic.  ° °[facility] ° [address], [place], [place] (336) 723-7904   Eligibility Requirements °You must have lived in [place], or [place] counties for at least the last three months. °  You cannot be eligible for state or federal sponsored healthcare insurance, including Veterans  Administration, Medicaid, or Medicare. °  You generally cannot be eligible for healthcare insurance through your employer.  °  How to apply: °Eligibility screenings are held every Tuesday and Wednesday afternoon from 1:00 pm until 4:00 pm. You do not need an appointment for the interview!  °[facility] [address], [place], [place] 336-631-2330   °[facility]  336-342-8273   °[facility]  336-703-3100   °[facility]    336-570-6415   ° °Behavioral Health Resources in the Community: °Intensive Outpatient Programs °Organization         Address  Phone  Notes  °[facility] [address], [place], [place] 336-878-6098   °[facility] [address], [place], [place] 336-832-9800   °[facility] [address], [place], [place] ° 336-882-2125   °[facility] [address],  °[place], [place] 1-800-853-5163 or 336-641-4981   °Substance Abuse Resources °Organization         Address  Phone  Notes  °[facility]  336-882-2125   °[facility]  336-784-9470   °[facility]  336-285-9073   °[facility]  336-845-3988   °Residential & Outpatient Substance Abuse Program  1-800-659-3381   °Psychological Services °Organization         Address  Phone  Notes  ° Health  336- 832-9600   °[facility]  336- 378-7881   °[facility] [address], [place] 1-800-853-5163 or 336-641-4981   ° °Mobile Crisis Teams °Organization         Address  Phone  Notes  °[facility], Mobile Crisis Care Unit  1-877-626-1772   °Assertive °[facility] ° [address] [place], [place] 336-834-9664   °Sharon DeEsch [address] °[place] 336-554-5454   ° °Self-Help/Support Groups °Organization         Address  Phone             Notes  °[facility] -  variety of support groups  336- 373-1402 Call for more information  °Narcotics Anonymous (NA), [facility] [address], °[place]  2 meetings at this location  ° °Residential Treatment Programs °Organization         Address  Phone  Notes  °[facility] [address],    °[place]  1-866-801-8205   °[facility] ° [address], [place], [place] 704-293-8524   °[facility] [address], [place] 336-845-3988 Admissions: 8am-3pm M-F  °[facility] [address]    °[place], [place] 336-841-1104   °[facility] [address], [place], [place] 336-379-7146   °[facility] [address]  °[place], [place] 336-285-9073   °[facility] - Intensive Outpatient [address], [place], [place] 336-852-3033   °[facility] [address]  °[place], [place] 1-877-615-2722 or 336-784-9470   °[facility] [address] [place], [place] 336-227-7417 Accepts Medicaid  °[facility] [address]  °[place] 1-800-659-3381 Substance Abuse/Addiction Treatment  ° °[place] Behavioral Health Resources °Organization         Address  Phone  Notes  °[facility]  (888) 581-9988   °Julie Brannon, PhD [address] [place], [place]   (336) 349-5553 or (336) 951-0000   °[facility]   [address] °[place], [place] (336) 349-4454   °[facility] [address], [place], [place] (336) 342-8316 Insurance/Medicaid/sponsorship through [facility]  °[facility] [address]                                    [place],  (336) 342-8316 Therapy/tele-psych/case  °[facility] [address]  ° [place], [place] (336) 349-2233    °Dr. Arfeen  (336) 349-4544   °[facility] [place]  [employer] [place] [facility] 1) [address],  °2) [address], [place] °3)  [address], [place] (336) 349-3220 °(336) 342-7768 ° °(336) 342-8140   °[place] [facility] (336) 342-1394 or (336) 342-3537 (After Hours)    ° ° ° °

## 2015-10-30 ENCOUNTER — Encounter (HOSPITAL_COMMUNITY): Payer: Self-pay | Admitting: *Deleted

## 2015-10-30 ENCOUNTER — Emergency Department (HOSPITAL_COMMUNITY)
Admission: EM | Admit: 2015-10-30 | Discharge: 2015-10-30 | Disposition: A | Discharge status: Home / Self Care | Payer: Medicaid Other | Attending: Emergency Medicine | Admitting: Emergency Medicine

## 2015-10-30 DIAGNOSIS — G43809 Other migraine, not intractable, without status migrainosus: Secondary | ICD-10-CM

## 2015-10-30 DIAGNOSIS — Z87828 Personal history of other (healed) physical injury and trauma: Secondary | ICD-10-CM | POA: Insufficient documentation

## 2015-10-30 DIAGNOSIS — Z862 Personal history of diseases of the blood and blood-forming organs and certain disorders involving the immune mechanism: Secondary | ICD-10-CM | POA: Insufficient documentation

## 2015-10-30 DIAGNOSIS — Z791 Long term (current) use of non-steroidal anti-inflammatories (NSAID): Secondary | ICD-10-CM | POA: Insufficient documentation

## 2015-10-30 MED ORDER — DIPHENHYDRAMINE HCL 50 MG/ML IJ SOLN
12.5000 mg | Freq: Once | INTRAMUSCULAR | Status: AC
  Administered 2015-10-30: 12.5 mg via INTRAVENOUS
  Filled 2015-10-30: qty 1

## 2015-10-30 MED ORDER — DEXAMETHASONE SODIUM PHOSPHATE 10 MG/ML IJ SOLN
10.0000 mg | Freq: Once | INTRAMUSCULAR | Status: AC
  Administered 2015-10-30: 10 mg via INTRAVENOUS
  Filled 2015-10-30: qty 1

## 2015-10-30 MED ORDER — KETOROLAC TROMETHAMINE 30 MG/ML IJ SOLN
30.0000 mg | Freq: Once | INTRAMUSCULAR | Status: AC
  Administered 2015-10-30: 30 mg via INTRAVENOUS
  Filled 2015-10-30: qty 1

## 2015-10-30 MED ORDER — METOCLOPRAMIDE HCL 5 MG/ML IJ SOLN
10.0000 mg | Freq: Once | INTRAMUSCULAR | Status: AC
  Administered 2015-10-30: 10 mg via INTRAVENOUS
  Filled 2015-10-30: qty 2

## 2015-10-30 MED ORDER — SODIUM CHLORIDE 0.9 % IV BOLUS (SEPSIS)
1000.0000 mL | Freq: Once | INTRAVENOUS | Status: AC
  Administered 2015-10-30: 1000 mL via INTRAVENOUS
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
## 2015-10-30 NOTE — ED Notes (Signed)
PT reports her ears hurt and light sensitivity .

## 2015-10-30 NOTE — ED Notes (Signed)
Declined W/C at D/C and was escorted to lobby by RN. 

## 2015-10-30 NOTE — ED Notes (Addendum)
Pt reports headache with n/v for 4 days. Pt reports no relief with OTC or prescription  medications.

## 2015-10-30 NOTE — Discharge Instructions (Signed)
Migraine Headache A migraine headache is an intense, throbbing pain on one or both sides of your head. A migraine can last for 30 minutes to several hours. CAUSES  The exact cause of a migraine headache is not always known. However, a migraine may be caused when nerves in the brain become irritated and release chemicals that cause inflammation. This causes pain. Certain things may also trigger migraines, such as:  Alcohol.  Smoking.  Stress.  Menstruation.  Aged cheeses.  Foods or drinks that contain nitrates, glutamate, aspartame, or tyramine.  Lack of sleep.  Chocolate.  Caffeine.  Hunger.  Physical exertion.  Fatigue.  Medicines used to treat chest pain (nitroglycerine), birth control pills, estrogen, and some blood pressure medicines. SIGNS AND SYMPTOMS  Pain on one or both sides of your head.  Pulsating or throbbing pain.  Severe pain that prevents daily activities.  Pain that is aggravated by any physical activity.  Nausea, vomiting, or both.  Dizziness.  Pain with exposure to bright lights, loud noises, or activity.  General sensitivity to bright lights, loud noises, or smells. Before you get a migraine, you may get warning signs that a migraine is coming (aura). An aura may include:  Seeing flashing lights.  Seeing bright spots, halos, or zigzag lines.  Having tunnel vision or blurred vision.  Having feelings of numbness or tingling.  Having trouble talking.  Having muscle weakness. DIAGNOSIS  A migraine headache is often diagnosed based on:  Symptoms.  Physical exam.  A CT scan or MRI of your head. These imaging tests cannot diagnose migraines, but they can help rule out other causes of headaches. TREATMENT Medicines may be given for pain and nausea. Medicines can also be given to help prevent recurrent migraines.  HOME CARE INSTRUCTIONS  Only take over-the-counter or prescription medicines for pain or discomfort as directed by your  health care provider. The use of long-term narcotics is not recommended.  Lie down in a dark, quiet room when you have a migraine.  Keep a journal to find out what may trigger your migraine headaches. For example, write down:  What you eat and drink.  How much sleep you get.  Any change to your diet or medicines.  Limit alcohol consumption.  Quit smoking if you smoke.  Get 7-9 hours of sleep, or as recommended by your health care provider.  Limit stress.  Keep lights dim if bright lights bother you and make your migraines worse. SEEK IMMEDIATE MEDICAL CARE IF:   Your migraine becomes severe.  You have a fever.  You have a stiff neck.  You have vision loss.  You have muscular weakness or loss of muscle control.  You start losing your balance or have trouble walking.  You feel faint or pass out.  You have severe symptoms that are different from your first symptoms. MAKE SURE YOU:   Understand these instructions.  Will watch your condition.  Will get help right away if you are not doing well or get worse.   This information is not intended to replace advice given to you by your health care provider. Make sure you discuss any questions you have with your health care provider.   Follow-up with neurology if your symptoms aren't improved. Take home Aleve or ibuprofen as needed for migraines. Stay adequately hydrated, drink plenty of fluids. Return to the emergency department if you experience fever, neck pain or stiffness, numbness or tingling in extremities, loss of consciousness, dizziness, blurred vision.

## 2015-11-24 ENCOUNTER — Encounter (HOSPITAL_COMMUNITY): Payer: Self-pay

## 2015-11-24 DIAGNOSIS — Z87828 Personal history of other (healed) physical injury and trauma: Secondary | ICD-10-CM | POA: Insufficient documentation

## 2015-11-24 DIAGNOSIS — Z862 Personal history of diseases of the blood and blood-forming organs and certain disorders involving the immune mechanism: Secondary | ICD-10-CM | POA: Insufficient documentation

## 2015-11-24 DIAGNOSIS — B349 Viral infection, unspecified: Secondary | ICD-10-CM | POA: Insufficient documentation

## 2015-11-24 DIAGNOSIS — H9209 Otalgia, unspecified ear: Secondary | ICD-10-CM | POA: Insufficient documentation

## 2015-11-24 DIAGNOSIS — G43809 Other migraine, not intractable, without status migrainosus: Secondary | ICD-10-CM | POA: Insufficient documentation

## 2015-11-24 NOTE — ED Notes (Signed)
Pt states everyone in her house has the flu and her symptoms of headache and generalized body aches started yesterday.

## 2015-11-25 ENCOUNTER — Emergency Department (HOSPITAL_COMMUNITY)
Admission: EM | Admit: 2015-11-25 | Discharge: 2015-11-25 | Disposition: A | Discharge status: Home / Self Care | Payer: Medicaid Other | Attending: Emergency Medicine | Admitting: Emergency Medicine

## 2015-11-25 DIAGNOSIS — B349 Viral infection, unspecified: Secondary | ICD-10-CM

## 2015-11-25 DIAGNOSIS — G43809 Other migraine, not intractable, without status migrainosus: Secondary | ICD-10-CM

## 2015-11-25 MED ORDER — KETOROLAC TROMETHAMINE 30 MG/ML IJ SOLN
30.0000 mg | Freq: Once | INTRAMUSCULAR | Status: AC
  Administered 2015-11-25: 30 mg via INTRAVENOUS
  Filled 2015-11-25: qty 1

## 2015-11-25 MED ORDER — SODIUM CHLORIDE 0.9 % IV BOLUS (SEPSIS)
500.0000 mL | Freq: Once | INTRAVENOUS | Status: AC
  Administered 2015-11-25: 500 mL via INTRAVENOUS

## 2015-11-25 MED ORDER — DEXAMETHASONE SODIUM PHOSPHATE 10 MG/ML IJ SOLN
10.0000 mg | Freq: Once | INTRAMUSCULAR | Status: AC
  Administered 2015-11-25: 10 mg via INTRAVENOUS
  Filled 2015-11-25: qty 1

## 2015-11-25 MED ORDER — METOCLOPRAMIDE HCL 5 MG/ML IJ SOLN
10.0000 mg | Freq: Once | INTRAMUSCULAR | Status: AC
  Administered 2015-11-25: 10 mg via INTRAVENOUS
  Filled 2015-11-25: qty 2

## 2015-11-25 MED ORDER — DIPHENHYDRAMINE HCL 50 MG/ML IJ SOLN
12.5000 mg | Freq: Once | INTRAMUSCULAR | Status: AC
  Administered 2015-11-25: 12.5 mg via INTRAVENOUS
  Filled 2015-11-25: qty 1

## 2015-11-25 NOTE — Discharge Instructions (Signed)
Migraine Headache A migraine headache is very bad, throbbing pain on one or both sides of your head. Talk to your doctor about what things may bring on (trigger) your migraine headaches. HOME CARE  Only take medicines as told by your doctor.  Lie down in a dark, quiet room when you have a migraine.  Keep a journal to find out if certain things bring on migraine headaches. For example, write down:  What you eat and drink.  How much sleep you get.  Any change to your diet or medicines.  Lessen how much alcohol you drink.  Quit smoking if you smoke.  Get enough sleep.  Lessen any stress in your life.  Keep lights dim if bright lights bother you or make your migraines worse. GET HELP RIGHT AWAY IF:   Your migraine becomes really bad.  You have a fever.  You have a stiff neck.  You have trouble seeing.  Your muscles are weak, or you lose muscle control.  You lose your balance or have trouble walking.  You feel like you will pass out (faint), or you pass out.  You have really bad symptoms that are different than your first symptoms. MAKE SURE YOU:   Understand these instructions.  Will watch your condition.  Will get help right away if you are not doing well or get worse.   This information is not intended to replace advice given to you by your health care provider. Make sure you discuss any questions you have with your health care provider.   Document Released: 06/29/2008 Document Revised: 12/13/2011 Document Reviewed: 05/28/2013 Elsevier Interactive Patient Education 2016 Elsevier Inc.  Viral Infections A virus is a type of germ. Viruses can cause:  Minor sore throats.  Aches and pains.  Headaches.  Runny nose.  Rashes.  Watery eyes.  Tiredness.  Coughs.  Loss of appetite.  Feeling sick to your stomach (nausea).  Throwing up (vomiting).  Watery poop (diarrhea). HOME CARE   Only take medicines as told by your doctor.  Drink enough water  and fluids to keep your pee (urine) clear or pale yellow. Sports drinks are a good choice.  Get plenty of rest and eat healthy. Soups and broths with crackers or rice are fine. GET HELP RIGHT AWAY IF:   You have a very bad headache.  You have shortness of breath.  You have chest pain or neck pain.  You have an unusual rash.  You cannot stop throwing up.  You have watery poop that does not stop.  You cannot keep fluids down.  You or your child has a temperature by mouth above 102 F (38.9 C), not controlled by medicine.  Your baby is older than 3 months with a rectal temperature of 102 F (38.9 C) or higher.  Your baby is 82 months old or younger with a rectal temperature of 100.4 F (38 C) or higher. MAKE SURE YOU:   Understand these instructions.  Will watch this condition.  Will get help right away if you are not doing well or get worse.   This information is not intended to replace advice given to you by your health care provider. Make sure you discuss any questions you have with your health care provider.   Follow up with primary care provider if your symptoms do not improve. Encourage adequate hydration, drink plenty of fluids. Take ibuprofen and tylenol for body aches and headache. Return to the ED if you experience severe worsening of your symptoms,  fever, difficulty breathing, neck pain/stiffness, blurry vision, numbness/tingling in your extremities.
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
## 2015-11-26 ENCOUNTER — Encounter (HOSPITAL_BASED_OUTPATIENT_CLINIC_OR_DEPARTMENT_OTHER): Payer: Self-pay | Admitting: Emergency Medicine

## 2015-12-09 IMAGING — CR DG CHEST 2V
3 series · 3 of 3 positions shown · non-contrast
Comparison: 05/17/2015

CLINICAL DATA: Left upper and lower chest pain 1 month.

EXAM:
CHEST  2 VIEW

[chest pa (1 of 2)]
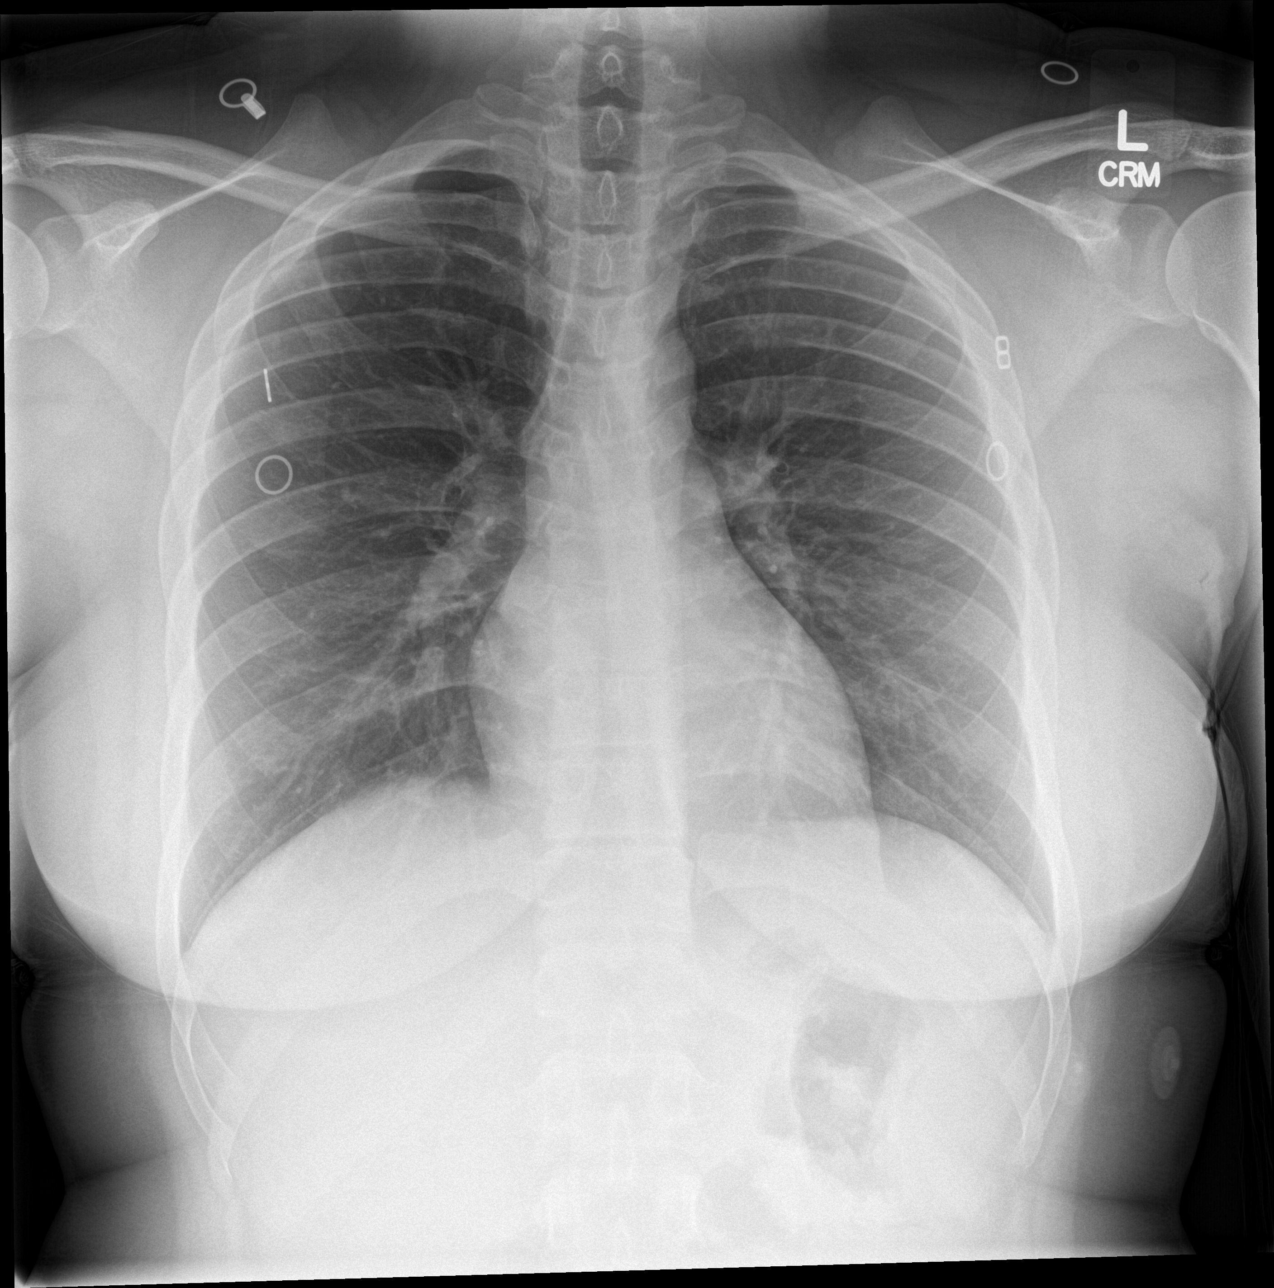

[chest lat]
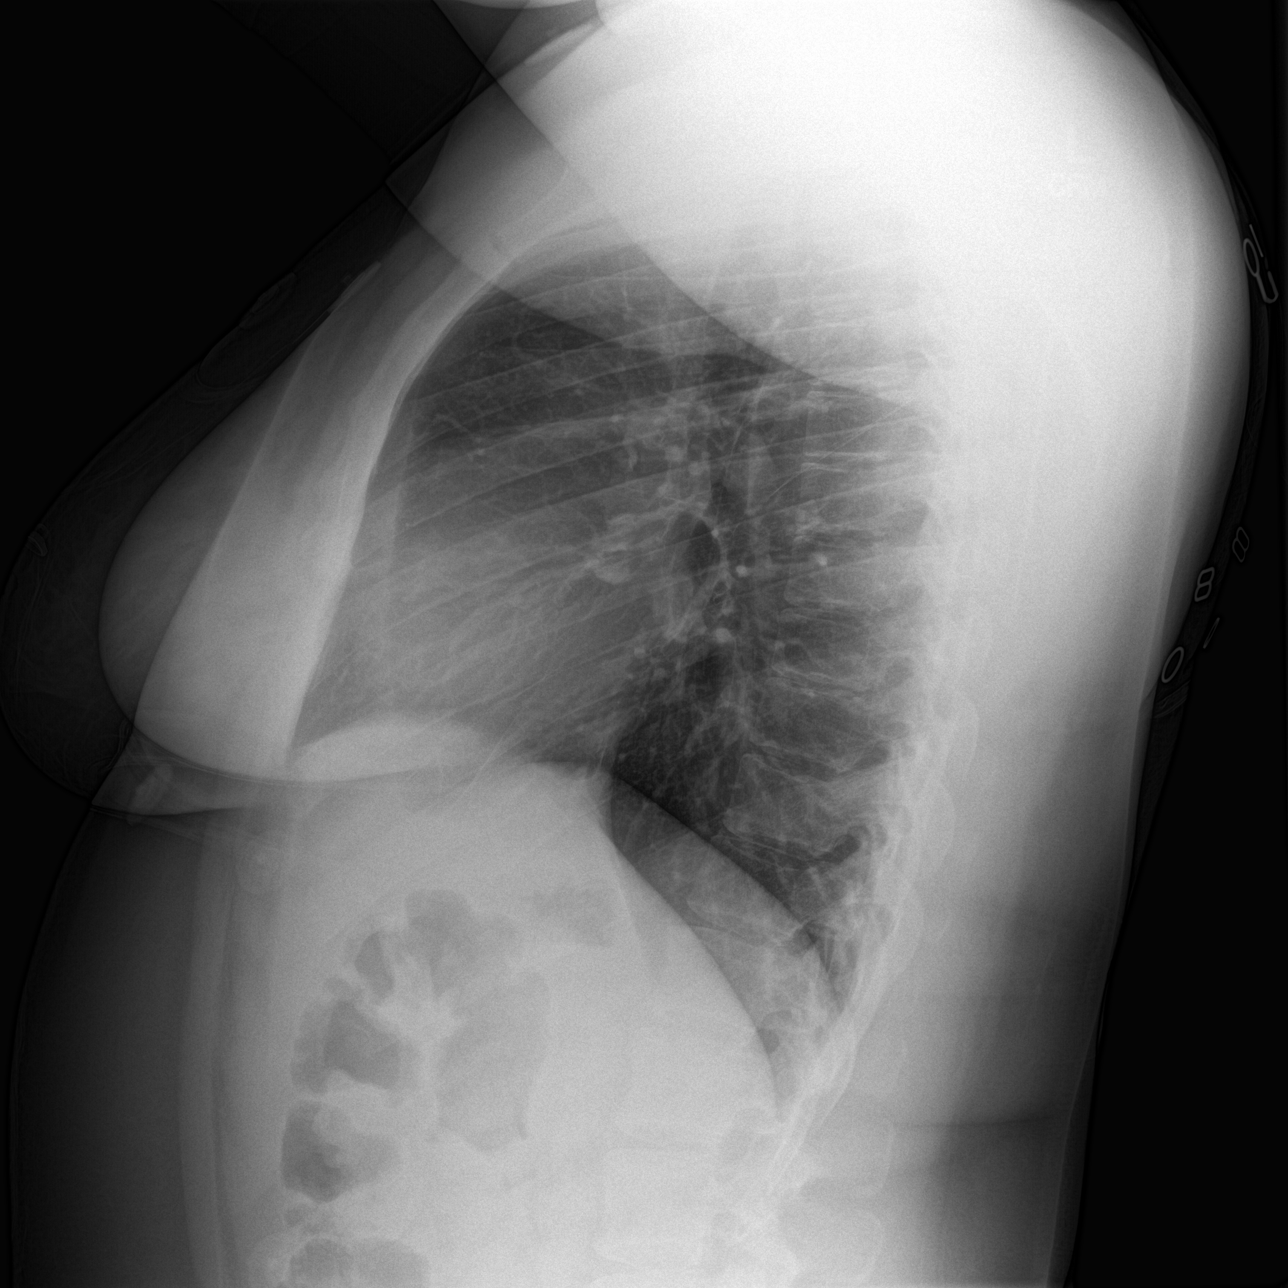

[chest pa (2 of 2)]
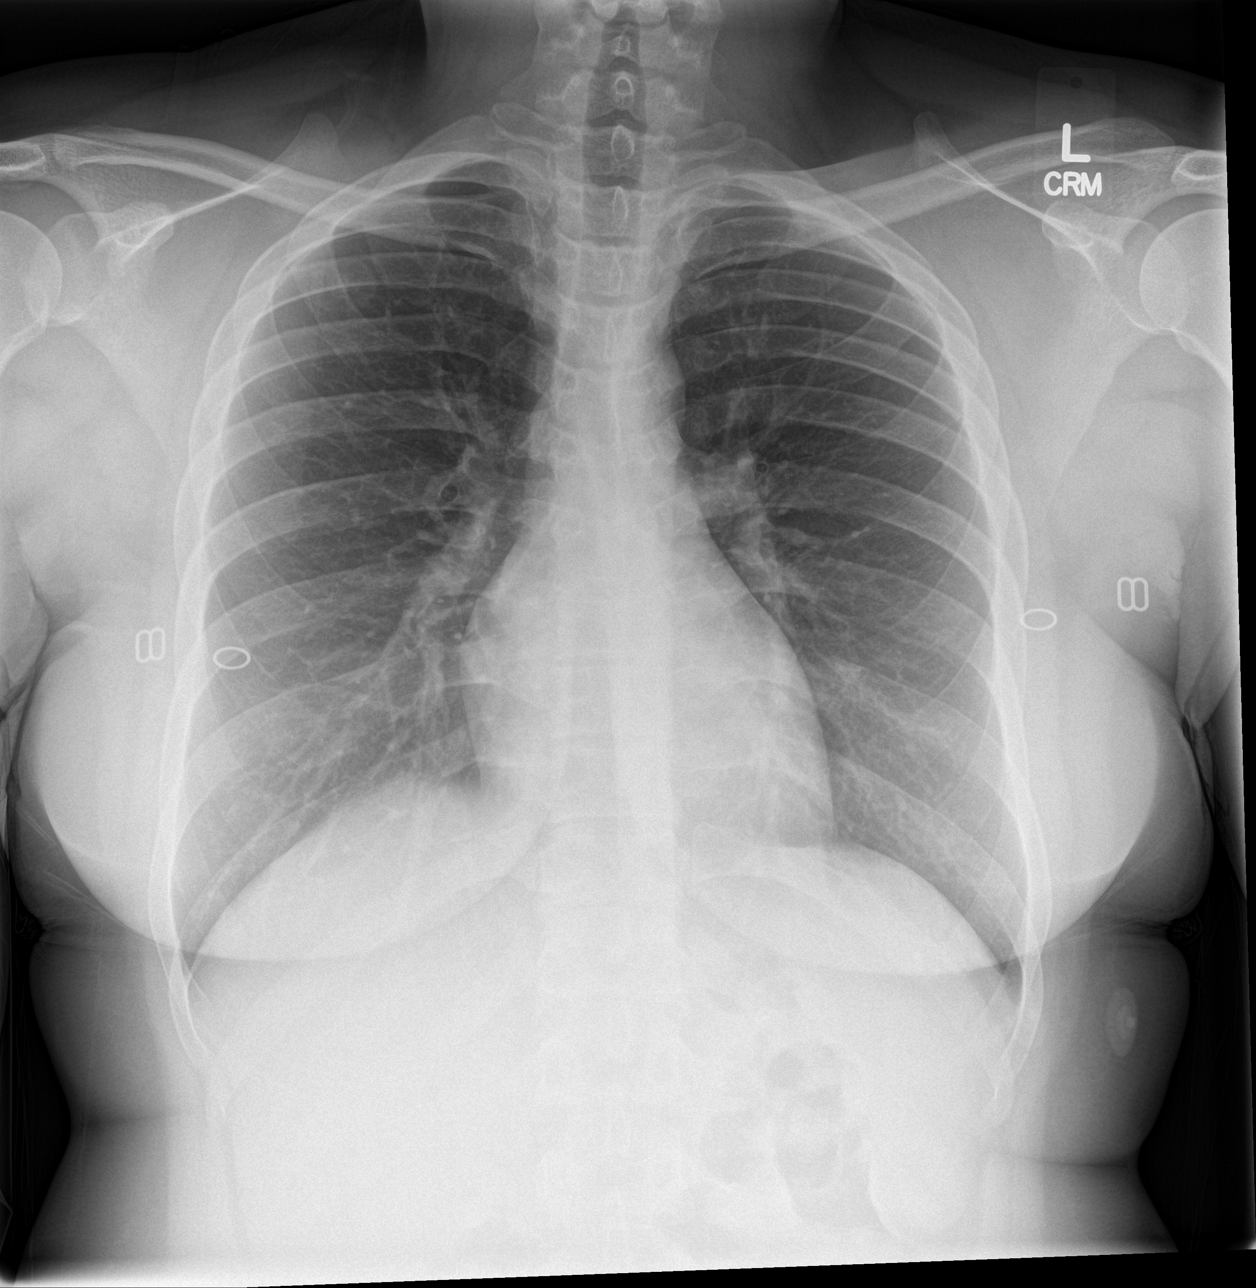

[3 of 3 positions shown; findings below may reference images not displayed]

FINDINGS: Lungs are clear. Cardiomediastinal silhouette, bones and soft
tissues are within normal.
IMPRESSION: No active cardiopulmonary disease.

## 2016-02-23 ENCOUNTER — Encounter (HOSPITAL_COMMUNITY): Payer: Self-pay | Admitting: *Deleted

## 2016-02-23 ENCOUNTER — Emergency Department (HOSPITAL_COMMUNITY)
Admission: EM | Admit: 2016-02-23 | Discharge: 2016-02-23 | Disposition: A | Discharge status: Home / Self Care | Payer: Medicaid Other | Attending: Emergency Medicine | Admitting: Emergency Medicine

## 2016-02-23 DIAGNOSIS — J029 Acute pharyngitis, unspecified: Secondary | ICD-10-CM

## 2016-02-23 DIAGNOSIS — Z862 Personal history of diseases of the blood and blood-forming organs and certain disorders involving the immune mechanism: Secondary | ICD-10-CM | POA: Insufficient documentation

## 2016-02-23 DIAGNOSIS — Z8679 Personal history of other diseases of the circulatory system: Secondary | ICD-10-CM | POA: Diagnosis not present

## 2016-02-23 DIAGNOSIS — Z87828 Personal history of other (healed) physical injury and trauma: Secondary | ICD-10-CM | POA: Diagnosis not present

## 2016-02-23 DIAGNOSIS — H9203 Otalgia, bilateral: Secondary | ICD-10-CM | POA: Insufficient documentation

## 2016-02-23 LAB — RAPID STREP SCREEN (MED CTR MEBANE ONLY): STREPTOCOCCUS, GROUP A SCREEN (DIRECT): NEGATIVE

## 2016-02-23 MED ORDER — MAGIC MOUTHWASH: 5.0000 mL | Freq: Three times a day (TID) | ORAL | Status: DC | PRN

## 2016-02-23 NOTE — ED Notes (Signed)
Pt reports her ears have been bothering her for one month. Pt's daughter was recently treated in ED for Herpangina.
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
## 2016-02-23 NOTE — Discharge Instructions (Signed)
Earache An earache, also called otalgia, can be caused by many things. Pain from an earache can be sharp, dull, or burning. The pain may be temporary or constant. Earaches can be caused by problems with the ear, such as infection in either the middle ear or the ear canal, injury, impacted ear wax, middle ear pressure, or a foreign body in the ear. Ear pain can also result from problems in other areas. This is called referred pain. For example, pain can come from a sore throat, a tooth infection, or problems with the jaw or the joint between the jaw and the skull (temporomandibular joint, or TMJ). The cause of an earache is not always easy to identify. Watchful waiting may be appropriate for some earaches until a clear cause of the pain can be found. HOME CARE INSTRUCTIONS Watch your condition for any changes. The following actions may help to lessen any discomfort that you are feeling:  Take medicines only as directed by your health care provider. This includes ear drops.  Apply ice to your outer ear to help reduce pain.  Put ice in a plastic bag.  Place a towel between your skin and the bag.  Leave the ice on for 20 minutes, 2-3 times per day.  Do not put anything in your ear other than medicine that is prescribed by your health care provider.  Try resting in an upright position instead of lying down. This may help to reduce pressure in the middle ear and relieve pain.  Chew gum if it helps to relieve your ear pain.  Control any allergies that you have.  Keep all follow-up visits as directed by your health care provider. This is important. SEEK MEDICAL CARE IF:  Your pain does not improve within 2 days.  You have a fever.  You have new or worsening symptoms. SEEK IMMEDIATE MEDICAL CARE IF:  You have a severe headache.  You have a stiff neck.  You have difficulty swallowing.  You have redness or swelling behind your ear.  You have drainage from your ear.  You have hearing  loss.  You feel dizzy.   This information is not intended to replace advice given to you by your health care provider. Make sure you discuss any questions you have with your health care provider.   Document Released: 05/07/2004 Document Revised: 10/11/2014 Document Reviewed: 04/21/2014 Elsevier Interactive Patient Education 2016 Elsevier Inc.  Sore Throat A sore throat is pain, burning, irritation, or scratchiness of the throat. There is often pain or tenderness when swallowing or talking. A sore throat may be accompanied by other symptoms, such as coughing, sneezing, fever, and swollen neck glands. A sore throat is often the first sign of another sickness, such as a cold, flu, strep throat, or mononucleosis (commonly known as mono). Most sore throats go away without medical treatment. CAUSES  The most common causes of a sore throat include:  A viral infection, such as a cold, flu, or mono.  A bacterial infection, such as strep throat, tonsillitis, or whooping cough.  Seasonal allergies.  Dryness in the air.  Irritants, such as smoke or pollution.  Gastroesophageal reflux disease (GERD). HOME CARE INSTRUCTIONS   Only take over-the-counter medicines as directed by your caregiver.  Drink enough fluids to keep your urine clear or pale yellow.  Rest as needed.  Try using throat sprays, lozenges, or sucking on hard candy to ease any pain (if older than 4 years or as directed).  Sip warm liquids, such  as broth, herbal tea, or warm water with honey to relieve pain temporarily. You may also eat or drink cold or frozen liquids such as frozen ice pops.  Gargle with salt water (mix 1 tsp salt with 8 oz of water).  Do not smoke and avoid secondhand smoke.  Put a cool-mist humidifier in your bedroom at night to moisten the air. You can also turn on a hot shower and sit in the bathroom with the door closed for 5-10 minutes. SEEK IMMEDIATE MEDICAL CARE IF:  You have difficulty  breathing.  You are unable to swallow fluids, soft foods, or your saliva.  You have increased swelling in the throat.  Your sore throat does not get better in 7 days.  You have nausea and vomiting.  You have a fever or persistent symptoms for more than 2-3 days.  You have a fever and your symptoms suddenly get worse. MAKE SURE YOU:   Understand these instructions.  Will watch your condition.  Will get help right away if you are not doing well or get worse.   This information is not intended to replace advice given to you by your health care provider. Make sure you discuss any questions you have with your health care provider.   Document Released: 10/28/2004 Document Revised: 10/11/2014 Document Reviewed: 05/28/2012 Elsevier Interactive Patient Education [employer]

## 2016-02-25 LAB — CULTURE, GROUP A STREP (THRC)

## 2016-04-19 ENCOUNTER — Ambulatory Visit (INDEPENDENT_AMBULATORY_CARE_PROVIDER_SITE_OTHER): Payer: Self-pay

## 2016-04-19 DIAGNOSIS — Z3202 Encounter for pregnancy test, result negative: Secondary | ICD-10-CM

## 2016-04-19 DIAGNOSIS — Z3200 Encounter for pregnancy test, result unknown: Secondary | ICD-10-CM

## 2016-04-19 LAB — POCT PREGNANCY, URINE: Preg Test, Ur: NEGATIVE

## 2016-04-19 NOTE — Progress Notes (Signed)
Patient presented to clinic today for a pregnancy test. Test confirms she is not pregnant at this time. Patient verbalizes understanding at this time.

## 2016-09-09 ENCOUNTER — Ambulatory Visit: Payer: Medicaid Other | Admitting: Obstetrics

## 2016-10-28 ENCOUNTER — Encounter (HOSPITAL_COMMUNITY): Payer: Self-pay

## 2016-10-28 ENCOUNTER — Emergency Department (HOSPITAL_COMMUNITY): Payer: Medicaid Other

## 2016-10-28 DIAGNOSIS — Z79899 Other long term (current) drug therapy: Secondary | ICD-10-CM | POA: Insufficient documentation

## 2016-10-28 DIAGNOSIS — D539 Nutritional anemia, unspecified: Secondary | ICD-10-CM | POA: Insufficient documentation

## 2016-10-28 DIAGNOSIS — R51 Headache: Secondary | ICD-10-CM | POA: Insufficient documentation

## 2016-10-28 DIAGNOSIS — R0789 Other chest pain: Secondary | ICD-10-CM | POA: Diagnosis not present

## 2016-10-28 LAB — CBC
HEMATOCRIT: 32.7 % — AB (ref 36.0–46.0)
HEMOGLOBIN: 10 g/dL — AB (ref 12.0–15.0)
MCH: 22 pg — ABNORMAL LOW (ref 26.0–34.0)
MCHC: 30.6 g/dL (ref 30.0–36.0)
MCV: 72 fL — ABNORMAL LOW (ref 78.0–100.0)
Platelets: 344 10*3/uL (ref 150–400)
RBC: 4.54 MIL/uL (ref 3.87–5.11)
RDW: 15.6 % — ABNORMAL HIGH (ref 11.5–15.5)
WBC: 5 10*3/uL (ref 4.0–10.5)

## 2016-10-28 LAB — BASIC METABOLIC PANEL
ANION GAP: 8 (ref 5–15)
BUN: 12 mg/dL (ref 6–20)
CO2: 26 mmol/L (ref 22–32)
Calcium: 9 mg/dL (ref 8.9–10.3)
Chloride: 102 mmol/L (ref 101–111)
Creatinine, Ser: 0.76 mg/dL (ref 0.44–1.00)
GLUCOSE: 108 mg/dL — AB (ref 65–99)
POTASSIUM: 3.5 mmol/L (ref 3.5–5.1)
Sodium: 136 mmol/L (ref 135–145)

## 2016-10-28 LAB — I-STAT TROPONIN, ED: Troponin i, poc: 0 ng/mL (ref 0.00–0.08)

## 2016-10-28 LAB — I-STAT BETA HCG BLOOD, ED (MC, WL, AP ONLY): I-stat hCG, quantitative: <5 m[IU]/mL (ref <5)

## 2016-10-28 NOTE — ED Triage Notes (Signed)
Pt states that for the past several months she has been having chest tightness, dizziness, n/v abd pain, headaches, unable to swallow her own spit and three positive and three negative at home pregnancy test. Fevers on and off, reports SOB when chest tightness comes on.

## 2016-10-29 ENCOUNTER — Emergency Department (HOSPITAL_COMMUNITY)
Admission: EM | Admit: 2016-10-29 | Discharge: 2016-10-29 | Disposition: A | Discharge status: Home / Self Care | Payer: Medicaid Other | Attending: Emergency Medicine | Admitting: Emergency Medicine

## 2016-10-29 ENCOUNTER — Emergency Department (HOSPITAL_COMMUNITY): Payer: Medicaid Other

## 2016-10-29 DIAGNOSIS — R079 Chest pain, unspecified: Secondary | ICD-10-CM

## 2016-10-29 DIAGNOSIS — D509 Iron deficiency anemia, unspecified: Secondary | ICD-10-CM

## 2016-10-29 DIAGNOSIS — R519 Headache, unspecified: Secondary | ICD-10-CM

## 2016-10-29 DIAGNOSIS — R51 Headache: Secondary | ICD-10-CM

## 2016-10-29 LAB — D-DIMER, QUANTITATIVE (NOT AT ARMC): D DIMER QUANT: 0.71 ug{FEU}/mL — AB (ref 0.00–0.50)

## 2016-10-29 MED ORDER — METOCLOPRAMIDE HCL 5 MG/ML IJ SOLN
10.0000 mg | Freq: Once | INTRAMUSCULAR | Status: AC
  Administered 2016-10-29: 10 mg via INTRAVENOUS
  Filled 2016-10-29: qty 2

## 2016-10-29 MED ORDER — IOPAMIDOL (ISOVUE-370) INJECTION 76%
INTRAVENOUS | Status: AC
  Administered 2016-10-29: 100 mL
  Filled 2016-10-29: qty 100

## 2016-10-29 MED ORDER — KETOROLAC TROMETHAMINE 30 MG/ML IJ SOLN
30.0000 mg | Freq: Once | INTRAMUSCULAR | Status: AC
  Administered 2016-10-29: 30 mg via INTRAVENOUS
  Filled 2016-10-29: qty 1

## 2016-10-29 MED ORDER — SODIUM CHLORIDE 0.9 % IV BOLUS (SEPSIS)
1000.0000 mL | Freq: Once | INTRAVENOUS | Status: AC
  Administered 2016-10-29: 1000 mL via INTRAVENOUS

## 2016-10-29 MED ORDER — NAPROXEN 500 MG PO TABS: 500.0000 mg | ORAL_TABLET | Freq: Two times a day (BID) | ORAL | 0 refills | Status: DC

## 2016-10-29 MED ORDER — DIPHENHYDRAMINE HCL 50 MG/ML IJ SOLN
25.0000 mg | Freq: Once | INTRAMUSCULAR | Status: AC
  Administered 2016-10-29: 25 mg via INTRAVENOUS
  Filled 2016-10-29: qty 1

## 2016-10-29 MED ORDER — METOCLOPRAMIDE HCL 10 MG PO TABS: 10.0000 mg | ORAL_TABLET | Freq: Four times a day (QID) | ORAL | 0 refills | Status: DC | PRN

## 2016-10-29 NOTE — ED Notes (Signed)
Pt departed in NAD, refusing use of wheelchair. Joined significant other in D36 at this time.

## 2016-10-29 NOTE — ED Notes (Signed)
Patient transported to CT 

## 2016-10-29 NOTE — ED Notes (Signed)
Patient is currently in Peds room with daughter being seen as a patient at this time.  Will reassess when patient comes back to lobby.
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
## 2016-11-01 ENCOUNTER — Other Ambulatory Visit: Payer: Self-pay

## 2016-11-01 ENCOUNTER — Encounter (HOSPITAL_COMMUNITY): Payer: Self-pay

## 2016-11-01 DIAGNOSIS — R0789 Other chest pain: Secondary | ICD-10-CM | POA: Diagnosis not present

## 2016-11-01 NOTE — ED Triage Notes (Signed)
Patient here with ongoing chest pain with inspiration and seen on Thursday for same, wants further evaluation

## 2016-11-02 ENCOUNTER — Emergency Department (HOSPITAL_COMMUNITY)
Admission: EM | Admit: 2016-11-02 | Discharge: 2016-11-02 | Disposition: A | Discharge status: Home / Self Care | Payer: Medicaid Other | Attending: Emergency Medicine | Admitting: Emergency Medicine

## 2016-11-02 DIAGNOSIS — R079 Chest pain, unspecified: Secondary | ICD-10-CM

## 2016-11-02 MED ORDER — TRAMADOL HCL 50 MG PO TABS
50.0000 mg | ORAL_TABLET | Freq: Two times a day (BID) | ORAL | 0 refills | Status: DC | PRN
Start: 2016-11-02 — End: 2017-08-26

## 2016-11-02 MED ORDER — KETOROLAC TROMETHAMINE 60 MG/2ML IM SOLN
60.0000 mg | Freq: Once | INTRAMUSCULAR | Status: AC
  Administered 2016-11-02: 60 mg via INTRAMUSCULAR
  Filled 2016-11-02: qty 2

## 2016-11-02 MED ORDER — TRAMADOL HCL 50 MG PO TABS
50.0000 mg | ORAL_TABLET | Freq: Once | ORAL | Status: AC
  Administered 2016-11-02: 50 mg via ORAL
  Filled 2016-11-02: qty 1
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
## 2016-11-12 ENCOUNTER — Ambulatory Visit: Payer: Medicaid Other | Admitting: Obstetrics

## 2016-12-21 ENCOUNTER — Encounter (HOSPITAL_COMMUNITY): Payer: Self-pay | Admitting: *Deleted

## 2016-12-21 ENCOUNTER — Emergency Department (HOSPITAL_COMMUNITY)
Admission: EM | Admit: 2016-12-21 | Discharge: 2016-12-21 | Disposition: A | Discharge status: Home / Self Care | Payer: Medicaid Other | Attending: Emergency Medicine | Admitting: Emergency Medicine

## 2016-12-21 DIAGNOSIS — R112 Nausea with vomiting, unspecified: Secondary | ICD-10-CM | POA: Insufficient documentation

## 2016-12-21 DIAGNOSIS — R109 Unspecified abdominal pain: Secondary | ICD-10-CM | POA: Diagnosis present

## 2016-12-21 LAB — COMPREHENSIVE METABOLIC PANEL
ALT: 13 U/L — AB (ref 14–54)
ANION GAP: 8 (ref 5–15)
AST: 17 U/L (ref 15–41)
Albumin: 3.7 g/dL (ref 3.5–5.0)
Alkaline Phosphatase: 37 U/L — ABNORMAL LOW (ref 38–126)
BUN: 15 mg/dL (ref 6–20)
CHLORIDE: 105 mmol/L (ref 101–111)
CO2: 25 mmol/L (ref 22–32)
CREATININE: 0.66 mg/dL (ref 0.44–1.00)
Calcium: 9.3 mg/dL (ref 8.9–10.3)
Glucose, Bld: 117 mg/dL — ABNORMAL HIGH (ref 65–99)
POTASSIUM: 4.1 mmol/L (ref 3.5–5.1)
Sodium: 138 mmol/L (ref 135–145)
Total Bilirubin: 0.5 mg/dL (ref 0.3–1.2)
Total Protein: 7.1 g/dL (ref 6.5–8.1)

## 2016-12-21 LAB — CBC
HCT: 31.6 % — ABNORMAL LOW (ref 36.0–46.0)
Hemoglobin: 9.9 g/dL — ABNORMAL LOW (ref 12.0–15.0)
MCH: 22.5 pg — ABNORMAL LOW (ref 26.0–34.0)
MCHC: 31.3 g/dL (ref 30.0–36.0)
MCV: 71.8 fL — AB (ref 78.0–100.0)
PLATELETS: 344 10*3/uL (ref 150–400)
RBC: 4.4 MIL/uL (ref 3.87–5.11)
RDW: 15.5 % (ref 11.5–15.5)
WBC: 5.5 10*3/uL (ref 4.0–10.5)

## 2016-12-21 LAB — LIPASE, BLOOD

## 2016-12-21 MED ORDER — ONDANSETRON HCL 8 MG PO TABS: 8.0000 mg | ORAL_TABLET | Freq: Three times a day (TID) | ORAL | 0 refills | Status: DC | PRN

## 2016-12-21 MED ORDER — ONDANSETRON 4 MG PO TBDP
8.0000 mg | ORAL_TABLET | Freq: Once | ORAL | Status: AC
  Administered 2016-12-21: 8 mg via ORAL
  Filled 2016-12-21: qty 2

## 2016-12-21 NOTE — ED Notes (Signed)
Pt verbalizes understanding of discharge instructions. NAD. Ambulatory at discharge.  
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
## 2016-12-21 NOTE — Discharge Instructions (Signed)
Start with a clear liquid diet today then gradually advance to regular foods, later this evening.  Use Tylenol if needed for pain or fever.  See the doctor of your choice, as needed, for problems.

## 2016-12-21 NOTE — ED Triage Notes (Signed)
c/o vomiting x 48 hours c/o headache

## 2016-12-21 NOTE — ED Notes (Signed)
Pt is aware that urine is needed. Pt stated that she tried to give a sample earlier and she dropped the cup with the urine in it in the toilet. Pt is unable to give another sample at this time. Pt is in bed and on monitor at this time.

## 2017-03-21 ENCOUNTER — Encounter (HOSPITAL_COMMUNITY): Payer: Self-pay | Admitting: Emergency Medicine

## 2017-03-21 ENCOUNTER — Emergency Department (HOSPITAL_COMMUNITY)
Admission: EM | Admit: 2017-03-21 | Discharge: 2017-03-21 | Disposition: A | Discharge status: Home / Self Care | Payer: Medicaid Other | Attending: Emergency Medicine | Admitting: Emergency Medicine

## 2017-03-21 DIAGNOSIS — J028 Acute pharyngitis due to other specified organisms: Secondary | ICD-10-CM | POA: Diagnosis not present

## 2017-03-21 DIAGNOSIS — B9789 Other viral agents as the cause of diseases classified elsewhere: Secondary | ICD-10-CM | POA: Diagnosis not present

## 2017-03-21 DIAGNOSIS — J029 Acute pharyngitis, unspecified: Secondary | ICD-10-CM | POA: Diagnosis present

## 2017-03-21 DIAGNOSIS — Z79899 Other long term (current) drug therapy: Secondary | ICD-10-CM | POA: Insufficient documentation

## 2017-03-21 LAB — RAPID STREP SCREEN (MED CTR MEBANE ONLY): STREPTOCOCCUS, GROUP A SCREEN (DIRECT): NEGATIVE

## 2017-03-21 MED ORDER — HYDROCODONE-ACETAMINOPHEN 7.5-325 MG/15ML PO SOLN: 15.0000 mL | Freq: Three times a day (TID) | ORAL | 0 refills | Status: DC | PRN

## 2017-03-21 MED ORDER — FLUTICASONE PROPIONATE 50 MCG/ACT NA SUSP: 1.0000 | Freq: Every day | NASAL | 0 refills | Status: DC

## 2017-03-21 MED ORDER — DEXAMETHASONE SODIUM PHOSPHATE 10 MG/ML IJ SOLN
10.0000 mg | Freq: Once | INTRAMUSCULAR | Status: AC
  Administered 2017-03-21: 10 mg via INTRAMUSCULAR
  Filled 2017-03-21: qty 1
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
## 2017-03-21 NOTE — ED Triage Notes (Addendum)
Pt reports sore throat, bilat ear pain, fever of 102 at home, denies nasal drainage, body aches.  Pt reports daughter has similar symptoms, tested strep negative. Pt reports taking  1000mg  Tylenol this am, afebrile at this time. No redness or enlargement noted to tonsils.

## 2017-03-21 NOTE — Discharge Instructions (Signed)
Take your medication as prescribed as needed for pain relief. I also recommend continuing take Tylenol and ibuprofen at home as needed for pain and either. Use your nose spray as prescribed to help with your sinus congestion/ear effusion. Continue drinking fluids at home to remain hydrated. Follow-up with the primary care provider's office below in the next 3-4 days if your symptoms have not improved. Return to the emergency department if symptoms worsen or new onset of facial swelling, sensation of throat closing, drooling due to being unable to swallow, difficulty breathing, vomiting, unable to keep fluids down.

## 2017-03-23 LAB — CULTURE, GROUP A STREP (THRC)

## 2017-03-25 ENCOUNTER — Encounter (HOSPITAL_COMMUNITY): Payer: Self-pay | Admitting: Emergency Medicine

## 2017-03-25 ENCOUNTER — Emergency Department (HOSPITAL_COMMUNITY)
Admission: EM | Admit: 2017-03-25 | Discharge: 2017-03-26 | Disposition: A | Discharge status: Home / Self Care | Payer: Medicaid Other | Attending: Emergency Medicine | Admitting: Emergency Medicine

## 2017-03-25 DIAGNOSIS — L02411 Cutaneous abscess of right axilla: Secondary | ICD-10-CM

## 2017-03-25 DIAGNOSIS — L0291 Cutaneous abscess, unspecified: Secondary | ICD-10-CM

## 2017-03-25 DIAGNOSIS — M79601 Pain in right arm: Secondary | ICD-10-CM | POA: Diagnosis not present

## 2017-03-25 MED ORDER — LIDOCAINE-EPINEPHRINE (PF) 2 %-1:200000 IJ SOLN
20.0000 mL | Freq: Once | INTRAMUSCULAR | Status: AC
  Administered 2017-03-25: 20 mL
  Filled 2017-03-25: qty 20

## 2017-03-25 MED ORDER — IOPAMIDOL (ISOVUE-300) INJECTION 61%
INTRAVENOUS | Status: AC
Start: 2017-03-25 — End: 2017-03-26
  Administered 2017-03-26: 75 mL
  Filled 2017-03-25: qty 75

## 2017-03-25 MED ORDER — KETOROLAC TROMETHAMINE 30 MG/ML IJ SOLN
30.0000 mg | Freq: Once | INTRAMUSCULAR | Status: AC
  Administered 2017-03-25: 30 mg via INTRAVENOUS
  Filled 2017-03-25: qty 1

## 2017-03-25 MED ORDER — ACETAMINOPHEN 325 MG PO TABS
650.0000 mg | ORAL_TABLET | Freq: Once | ORAL | Status: AC
  Administered 2017-03-25: 650 mg via ORAL
  Filled 2017-03-25: qty 2

## 2017-03-25 MED ORDER — IBUPROFEN 800 MG PO TABS
800.0000 mg | ORAL_TABLET | Freq: Once | ORAL | Status: DC
Start: 2017-03-25 — End: 2017-03-25
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
## 2017-03-25 NOTE — ED Triage Notes (Signed)
Pt c/o abscess under right arm (recurrent)  St's started approx 1 1/2 weeks ago and drained at that time.  Pt st's area is larger now and will not drain

## 2017-03-26 ENCOUNTER — Encounter (HOSPITAL_COMMUNITY): Payer: Self-pay | Admitting: Radiology

## 2017-03-26 ENCOUNTER — Emergency Department (HOSPITAL_COMMUNITY): Payer: Medicaid Other

## 2017-03-26 MED ORDER — PROBIOTIC 250 MG PO CAPS: 1.0000 | ORAL_CAPSULE | Freq: Every day | ORAL | 0 refills | Status: DC

## 2017-03-26 MED ORDER — CLINDAMYCIN HCL 300 MG PO CAPS: 300.0000 mg | ORAL_CAPSULE | Freq: Two times a day (BID) | ORAL | 0 refills | Status: DC

## 2017-03-26 NOTE — Discharge Instructions (Signed)
Medications: clindamycin  Treatment: Take clindamycin twice daily for 10 days. Take a probiotic with this medication to help with stomach upset and prevent any loss of the good bacteria in your stomach. Keep dressing applied until the morning. Wash the area with warm soapy water and apply clean dressing.  Follow-up: Please return to emergency department in 2 days for wound recheck. [facility] further evaluation and treatment of your recurrent abscesses. Please return to the emergency department sooner if you develop any fevers, increasing pain, redness, swelling, red streaking from the area.

## 2017-03-26 NOTE — ED Notes (Signed)
Pt verbalized understanding discharge instructions and denies any further needs or questions at this time. VS stable, ambulatory and steady gait.   

## 2017-03-28 ENCOUNTER — Encounter (HOSPITAL_COMMUNITY): Payer: Self-pay

## 2017-03-28 DIAGNOSIS — Z4801 Encounter for change or removal of surgical wound dressing: Secondary | ICD-10-CM | POA: Insufficient documentation

## 2017-03-28 DIAGNOSIS — Z79899 Other long term (current) drug therapy: Secondary | ICD-10-CM | POA: Insufficient documentation

## 2017-03-28 NOTE — ED Triage Notes (Signed)
Pt here for wound check; states she had abscess on her right axilla drained two days ago. She states she was told to come for follow up in 2 days

## 2017-03-29 ENCOUNTER — Emergency Department (HOSPITAL_COMMUNITY)
Admission: EM | Admit: 2017-03-29 | Discharge: 2017-03-29 | Disposition: A | Discharge status: Home / Self Care | Payer: Medicaid Other | Attending: Emergency Medicine | Admitting: Emergency Medicine

## 2017-03-29 DIAGNOSIS — Z5189 Encounter for other specified aftercare: Secondary | ICD-10-CM
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
## 2017-03-31 ENCOUNTER — Encounter (HOSPITAL_COMMUNITY): Payer: Self-pay

## 2017-03-31 ENCOUNTER — Emergency Department (HOSPITAL_COMMUNITY)
Admission: EM | Admit: 2017-03-31 | Discharge: 2017-03-31 | Disposition: A | Discharge status: Home / Self Care | Payer: Medicaid Other | Attending: Emergency Medicine | Admitting: Emergency Medicine

## 2017-03-31 DIAGNOSIS — L02412 Cutaneous abscess of left axilla: Secondary | ICD-10-CM | POA: Diagnosis not present

## 2017-03-31 DIAGNOSIS — R600 Localized edema: Secondary | ICD-10-CM | POA: Diagnosis present

## 2017-03-31 DIAGNOSIS — L0291 Cutaneous abscess, unspecified: Secondary | ICD-10-CM

## 2017-03-31 LAB — POC URINE PREG, ED: Preg Test, Ur: NEGATIVE

## 2017-03-31 MED ORDER — LIDOCAINE HCL (PF) 1 % IJ SOLN
5.0000 mL | Freq: Once | INTRAMUSCULAR | Status: AC
  Administered 2017-03-31: 5 mL via INTRADERMAL
  Filled 2017-03-31: qty 5

## 2017-03-31 MED ORDER — ACETAMINOPHEN 325 MG PO TABS
650.0000 mg | ORAL_TABLET | Freq: Once | ORAL | Status: AC
  Administered 2017-03-31: 650 mg via ORAL
  Filled 2017-03-31: qty 2

## 2017-03-31 MED ORDER — SULFAMETHOXAZOLE-TRIMETHOPRIM 800-160 MG PO TABS: 1.0000 | ORAL_TABLET | Freq: Two times a day (BID) | ORAL | 0 refills | Status: AC
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
## 2017-03-31 NOTE — Discharge Instructions (Signed)
°  Apply warm compresses to the area to help continue express drainage.   Keep the wound clean and dry. Gently wash the wound with soap and water and make sure to pat it dry.    Take antibiotic and completion.   You can take Tylenol or Ibuprofen as directed for pain.  Followup with ED or Urgent Care  in 3 days for wound recheck.   Return to the Emergency Department if you experienced any worsening/spreading redness or swelling, fever, worsening pain, or any other worsening or concerning symptoms.

## 2017-03-31 NOTE — ED Notes (Signed)
I & D tray set up at bedside if needed. 

## 2017-03-31 NOTE — ED Triage Notes (Signed)
Patient complains of additional abscess to left axilla. Seen last Friday and had right axilla drained and started on antibiotic. Thinks she has allergy to antibiotic due to itching, taking benadryl with same, NAD

## 2017-03-31 NOTE — ED Notes (Signed)
Patient aware that a urine sample is needed 

## 2017-07-10 ENCOUNTER — Emergency Department (HOSPITAL_COMMUNITY)
Admission: EM | Admit: 2017-07-10 | Discharge: 2017-07-10 | Disposition: A | Discharge status: Home / Self Care | Payer: Medicaid Other | Attending: Emergency Medicine | Admitting: Emergency Medicine

## 2017-07-10 ENCOUNTER — Encounter (HOSPITAL_COMMUNITY): Payer: Self-pay | Admitting: *Deleted

## 2017-07-10 DIAGNOSIS — Z79899 Other long term (current) drug therapy: Secondary | ICD-10-CM | POA: Insufficient documentation

## 2017-07-10 DIAGNOSIS — N764 Abscess of vulva: Secondary | ICD-10-CM | POA: Diagnosis not present

## 2017-07-10 DIAGNOSIS — R600 Localized edema: Secondary | ICD-10-CM | POA: Diagnosis present

## 2017-07-10 HISTORY — DX: Obesity, unspecified: E66.9

## 2017-07-10 MED ORDER — LIDOCAINE HCL 2 % EX GEL
1.0000 "application " | Freq: Once | CUTANEOUS | Status: AC
  Administered 2017-07-10: 1 via TOPICAL
  Filled 2017-07-10: qty 20

## 2017-07-10 MED ORDER — SULFAMETHOXAZOLE-TRIMETHOPRIM 800-160 MG PO TABS: 1.0000 | ORAL_TABLET | Freq: Two times a day (BID) | ORAL | 0 refills | Status: AC

## 2017-07-10 MED ORDER — LIDOCAINE HCL (PF) 1 % IJ SOLN
5.0000 mL | Freq: Once | INTRAMUSCULAR | Status: AC
  Administered 2017-07-10: 5 mL
  Filled 2017-07-10: qty 5

## 2017-07-10 MED ORDER — OXYCODONE-ACETAMINOPHEN 5-325 MG PO TABS
2.0000 | ORAL_TABLET | Freq: Once | ORAL | Status: AC
  Administered 2017-07-10: 2 via ORAL
  Filled 2017-07-10: qty 2

## 2017-07-10 MED ORDER — NAPROXEN 375 MG PO TABS: 375.0000 mg | ORAL_TABLET | Freq: Two times a day (BID) | ORAL | 0 refills | Status: DC

## 2017-07-10 NOTE — ED Triage Notes (Signed)
Pt reports having an abscess in vaginal area x 4 days causing severe pain. Denies fever.
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
## 2017-07-10 NOTE — ED Notes (Signed)
Declined W/C at D/C and was escorted to lobby by RN. 

## 2017-07-10 NOTE — ED Notes (Signed)
Patient given ice pack

## 2017-08-26 ENCOUNTER — Encounter (HOSPITAL_COMMUNITY): Payer: Self-pay

## 2017-08-26 ENCOUNTER — Inpatient Hospital Stay (HOSPITAL_COMMUNITY)
Admission: AD | Admit: 2017-08-26 | Discharge: 2017-08-26 | Disposition: A | Discharge status: Home / Self Care | Payer: Medicaid Other | Source: Ambulatory Visit | Attending: Obstetrics and Gynecology | Admitting: Obstetrics and Gynecology

## 2017-08-26 ENCOUNTER — Inpatient Hospital Stay (HOSPITAL_COMMUNITY): Payer: Medicaid Other

## 2017-08-26 ENCOUNTER — Other Ambulatory Visit: Payer: Self-pay

## 2017-08-26 DIAGNOSIS — O26891 Other specified pregnancy related conditions, first trimester: Secondary | ICD-10-CM | POA: Diagnosis not present

## 2017-08-26 DIAGNOSIS — O9989 Other specified diseases and conditions complicating pregnancy, childbirth and the puerperium: Secondary | ICD-10-CM | POA: Diagnosis not present

## 2017-08-26 DIAGNOSIS — Z3A01 Less than 8 weeks gestation of pregnancy: Secondary | ICD-10-CM | POA: Diagnosis not present

## 2017-08-26 DIAGNOSIS — Z79899 Other long term (current) drug therapy: Secondary | ICD-10-CM | POA: Diagnosis not present

## 2017-08-26 DIAGNOSIS — R109 Unspecified abdominal pain: Secondary | ICD-10-CM | POA: Insufficient documentation

## 2017-08-26 DIAGNOSIS — O161 Unspecified maternal hypertension, first trimester: Secondary | ICD-10-CM | POA: Diagnosis not present

## 2017-08-26 DIAGNOSIS — N760 Acute vaginitis: Secondary | ICD-10-CM | POA: Diagnosis not present

## 2017-08-26 DIAGNOSIS — O23591 Infection of other part of genital tract in pregnancy, first trimester: Secondary | ICD-10-CM | POA: Diagnosis not present

## 2017-08-26 DIAGNOSIS — O10911 Unspecified pre-existing hypertension complicating pregnancy, first trimester: Secondary | ICD-10-CM | POA: Diagnosis not present

## 2017-08-26 DIAGNOSIS — O209 Hemorrhage in early pregnancy, unspecified: Secondary | ICD-10-CM | POA: Diagnosis not present

## 2017-08-26 DIAGNOSIS — B9689 Other specified bacterial agents as the cause of diseases classified elsewhere: Secondary | ICD-10-CM

## 2017-08-26 DIAGNOSIS — O4690 Antepartum hemorrhage, unspecified, unspecified trimester: Secondary | ICD-10-CM | POA: Diagnosis present

## 2017-08-26 DIAGNOSIS — O4691 Antepartum hemorrhage, unspecified, first trimester: Secondary | ICD-10-CM | POA: Insufficient documentation

## 2017-08-26 DIAGNOSIS — Z3491 Encounter for supervision of normal pregnancy, unspecified, first trimester: Secondary | ICD-10-CM

## 2017-08-26 HISTORY — DX: Unspecified pre-existing hypertension complicating pregnancy, first trimester: O10.911

## 2017-08-26 LAB — WET PREP, GENITAL
SPERM: NONE SEEN
TRICH WET PREP: NONE SEEN
Yeast Wet Prep HPF POC: NONE SEEN

## 2017-08-26 LAB — URINALYSIS, ROUTINE W REFLEX MICROSCOPIC
BILIRUBIN URINE: NEGATIVE
GLUCOSE, UA: NEGATIVE mg/dL
Hgb urine dipstick: NEGATIVE
Ketones, ur: NEGATIVE mg/dL
Leukocytes, UA: NEGATIVE
Nitrite: NEGATIVE
PH: 6 (ref 5.0–8.0)
Protein, ur: NEGATIVE mg/dL
SPECIFIC GRAVITY, URINE: 1.02 (ref 1.005–1.030)

## 2017-08-26 LAB — CBC
HEMATOCRIT: 33.9 % — AB (ref 36.0–46.0)
Hemoglobin: 11 g/dL — ABNORMAL LOW (ref 12.0–15.0)
MCH: 25.1 pg — ABNORMAL LOW (ref 26.0–34.0)
MCHC: 32.4 g/dL (ref 30.0–36.0)
MCV: 77.2 fL — AB (ref 78.0–100.0)
Platelets: 317 10*3/uL (ref 150–400)
RBC: 4.39 MIL/uL (ref 3.87–5.11)
RDW: 14.9 % (ref 11.5–15.5)
WBC: 6 10*3/uL (ref 4.0–10.5)

## 2017-08-26 LAB — POCT PREGNANCY, URINE: Preg Test, Ur: POSITIVE — AB

## 2017-08-26 LAB — HCG, QUANTITATIVE, PREGNANCY: hCG, Beta Chain, Quant, S: 65710 m[IU]/mL — ABNORMAL HIGH (ref <5)

## 2017-08-26 MED ORDER — METRONIDAZOLE 500 MG PO TABS: 500.0000 mg | ORAL_TABLET | Freq: Two times a day (BID) | ORAL | 0 refills | Status: DC
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
## 2017-08-26 NOTE — MAU Note (Signed)
Onset of spotting and cramping 2 days ago, LMP between 10/12 and 10/16 had positive home UPT

## 2017-08-27 LAB — HIV ANTIBODY (ROUTINE TESTING W REFLEX): HIV Screen 4th Generation wRfx: NONREACTIVE

## 2017-08-28 ENCOUNTER — Other Ambulatory Visit: Payer: Self-pay | Admitting: Certified Nurse Midwife

## 2017-08-28 MED ORDER — METRONIDAZOLE 0.75 % VA GEL: 1.0000 | Freq: Two times a day (BID) | VAGINAL | 0 refills | Status: AC

## 2017-08-28 NOTE — Progress Notes (Signed)
Patient called providers office at noon today. She reports that she was prescribed Flagyl that she has been unable to take due to vomiting it back up. She requested a different medication. Discussed with her a different route that can prescribed with Metrogel, patient agrees- Rx for Metrogel sent to pharmacy on 11/25

## 2017-08-29 LAB — GC/CHLAMYDIA PROBE AMP (~~LOC~~) NOT AT ARMC
Chlamydia: NEGATIVE
Neisseria Gonorrhea: NEGATIVE

## 2017-09-05 ENCOUNTER — Encounter (HOSPITAL_COMMUNITY): Payer: Self-pay | Admitting: *Deleted

## 2017-09-05 ENCOUNTER — Inpatient Hospital Stay (HOSPITAL_COMMUNITY)
Admission: AD | Admit: 2017-09-05 | Discharge: 2017-09-05 | Disposition: A | Discharge status: Home / Self Care | Payer: Medicaid Other | Source: Ambulatory Visit | Attending: Family Medicine | Admitting: Family Medicine

## 2017-09-05 ENCOUNTER — Encounter (HOSPITAL_COMMUNITY): Payer: Self-pay

## 2017-09-05 DIAGNOSIS — Z3A01 Less than 8 weeks gestation of pregnancy: Secondary | ICD-10-CM | POA: Diagnosis not present

## 2017-09-05 DIAGNOSIS — O99211 Obesity complicating pregnancy, first trimester: Secondary | ICD-10-CM | POA: Diagnosis not present

## 2017-09-05 DIAGNOSIS — Z79899 Other long term (current) drug therapy: Secondary | ICD-10-CM | POA: Diagnosis not present

## 2017-09-05 DIAGNOSIS — O4691 Antepartum hemorrhage, unspecified, first trimester: Secondary | ICD-10-CM | POA: Diagnosis present

## 2017-09-05 DIAGNOSIS — N898 Other specified noninflammatory disorders of vagina: Secondary | ICD-10-CM | POA: Diagnosis not present

## 2017-09-05 LAB — URINALYSIS, ROUTINE W REFLEX MICROSCOPIC
Bilirubin Urine: NEGATIVE
Glucose, UA: NEGATIVE mg/dL
Hgb urine dipstick: NEGATIVE
KETONES UR: NEGATIVE mg/dL
LEUKOCYTES UA: NEGATIVE
NITRITE: NEGATIVE
PROTEIN: NEGATIVE mg/dL
Specific Gravity, Urine: 1.029 (ref 1.005–1.030)
pH: 5 (ref 5.0–8.0)
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
## 2017-09-05 NOTE — Discharge Instructions (Signed)
Probiotics: lactobacillus and acidophilus take 1 by mouth daily    Bacterial Vaginosis Bacterial vaginosis is an infection of the vagina. It happens when too many germs (bacteria) grow in the vagina. This infection puts you at risk for infections from sex (STIs). Treating this infection can lower your risk for some STIs. You should also treat this if you are pregnant. It can cause your baby to be born early. Follow these instructions at home: Medicines  Take over-the-counter and prescription medicines only as told by your doctor.  Take or use your antibiotic medicine as told by your doctor. Do not stop taking or using it even if you start to feel better. General instructions  If you your sexual partner is a woman, tell her that you have this infection. She needs to get treatment if she has symptoms. If you have a female partner, he does not need to be treated.  During treatment: ? Avoid sex. ? Do not douche. ? Avoid alcohol as told. ? Avoid breastfeeding as told.  Drink enough fluid to keep your pee (urine) clear or pale yellow.  Keep your vagina and butt (rectum) clean. ? Wash the area with warm water every day. ? Wipe from front to back after you use the toilet.  Keep all follow-up visits as told by your doctor. This is important. Preventing this condition  Do not douche.  Use only warm water to wash around your vagina.  Use protection when you have sex. This includes: ? Latex condoms. ? Dental dams.  Limit how many people you have sex with. It is best to only have sex with the same person (be monogamous).  Get tested for STIs. Have your partner get tested.  Wear underwear that is cotton or lined with cotton.  Avoid tight pants and pantyhose. This is most important in summer.  Do not use any products that have nicotine or tobacco in them. These include cigarettes and e-cigarettes. If you need help quitting, ask your doctor.  Do not use illegal drugs.  Limit how much  alcohol you drink. Contact a doctor if:  Your symptoms do not get better, even after you are treated.  You have more discharge or pain when you pee (urinate).  You have a fever.  You have pain in your belly (abdomen).  You have pain with sex.  Your bleed from your vagina between periods. Summary  This infection happens when too many germs (bacteria) grow in the vagina.  Treating this condition can lower your risk for some infections from sex (STIs).  You should also treat this if you are pregnant. It can cause early (premature) birth.  Do not stop taking or using your antibiotic medicine even if you start to feel better. This information is not intended to replace advice given to you by your health care provider. Make sure you discuss any questions you have with your health care provider. Document Released: 06/29/2008 Document Revised: 06/05/2016 Document Reviewed: 06/05/2016 Elsevier Interactive Patient Education  2017 [employer]

## 2017-09-05 NOTE — MAU Note (Signed)
Pt reports she was seen here one week ago and dx with BV. States she was unable to take the pills because they made her sick so they called in the vaginal cream. States since she started using it she has been having vaginal irritation and pain. Reports she is having pain in her legs also.

## 2017-09-17 ENCOUNTER — Other Ambulatory Visit: Payer: Self-pay

## 2017-09-17 ENCOUNTER — Inpatient Hospital Stay (HOSPITAL_COMMUNITY)
Admission: AD | Admit: 2017-09-17 | Discharge: 2017-09-18 | Disposition: A | Discharge status: Home / Self Care | Payer: Medicaid Other | Source: Ambulatory Visit | Attending: Obstetrics and Gynecology | Admitting: Obstetrics and Gynecology

## 2017-09-17 ENCOUNTER — Encounter (HOSPITAL_COMMUNITY): Payer: Self-pay | Admitting: *Deleted

## 2017-09-17 DIAGNOSIS — W001XXA Fall from stairs and steps due to ice and snow, initial encounter: Secondary | ICD-10-CM | POA: Insufficient documentation

## 2017-09-17 DIAGNOSIS — O26891 Other specified pregnancy related conditions, first trimester: Secondary | ICD-10-CM | POA: Insufficient documentation

## 2017-09-17 DIAGNOSIS — O9989 Other specified diseases and conditions complicating pregnancy, childbirth and the puerperium: Secondary | ICD-10-CM | POA: Diagnosis not present

## 2017-09-17 DIAGNOSIS — W19XXXA Unspecified fall, initial encounter: Secondary | ICD-10-CM

## 2017-09-17 DIAGNOSIS — G44201 Tension-type headache, unspecified, intractable: Secondary | ICD-10-CM | POA: Insufficient documentation

## 2017-09-17 DIAGNOSIS — Z3A09 9 weeks gestation of pregnancy: Secondary | ICD-10-CM | POA: Insufficient documentation

## 2017-09-17 DIAGNOSIS — T1490XA Injury, unspecified, initial encounter: Secondary | ICD-10-CM | POA: Diagnosis not present

## 2017-09-17 DIAGNOSIS — R109 Unspecified abdominal pain: Secondary | ICD-10-CM | POA: Diagnosis not present

## 2017-09-17 DIAGNOSIS — O9A211 Injury, poisoning and certain other consequences of external causes complicating pregnancy, first trimester: Secondary | ICD-10-CM | POA: Diagnosis not present

## 2017-09-17 LAB — URINALYSIS, ROUTINE W REFLEX MICROSCOPIC
Bilirubin Urine: NEGATIVE
GLUCOSE, UA: NEGATIVE mg/dL
HGB URINE DIPSTICK: NEGATIVE
KETONES UR: NEGATIVE mg/dL
LEUKOCYTES UA: NEGATIVE
Nitrite: NEGATIVE
PROTEIN: NEGATIVE mg/dL
Specific Gravity, Urine: 1.03 (ref 1.005–1.030)
pH: 5 (ref 5.0–8.0)

## 2017-09-17 MED ORDER — METOCLOPRAMIDE HCL 10 MG PO TABS
10.0000 mg | ORAL_TABLET | Freq: Three times a day (TID) | ORAL | Status: DC | PRN
  Administered 2017-09-18: 10 mg via ORAL
  Filled 2017-09-17: qty 1

## 2017-09-17 MED ORDER — DIPHENHYDRAMINE HCL 25 MG PO CAPS
25.0000 mg | ORAL_CAPSULE | Freq: Four times a day (QID) | ORAL | Status: DC | PRN
  Administered 2017-09-18: 25 mg via ORAL
  Filled 2017-09-17: qty 1

## 2017-09-17 MED ORDER — ACETAMINOPHEN 500 MG PO TABS
1000.0000 mg | ORAL_TABLET | Freq: Four times a day (QID) | ORAL | Status: DC | PRN
  Administered 2017-09-18: 1000 mg via ORAL
  Filled 2017-09-17: qty 2

## 2017-09-17 MED ORDER — CYCLOBENZAPRINE HCL 10 MG PO TABS: 10.0000 mg | ORAL_TABLET | Freq: Three times a day (TID) | ORAL | Status: DC | PRN

## 2017-09-17 NOTE — MAU Note (Addendum)
Last night I fell on stairs. Fell on bottom and slid down about 10 stairs. Part of the way down sort of turned to R side as slid down stairs. Stood up and made it down 2 stairs and fell the same way down the second set of stairs. No bleeding since then. Having some pain lower back and stomach. No bleeding. Also have a bad headache for 3 days. Tylenol not helping

## 2017-09-17 NOTE — MAU Note (Signed)
Beside u/s by Fabian NovemberM Bhambri CNM. FHR noted approx 160s and baby active
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
## 2017-09-18 DIAGNOSIS — R109 Unspecified abdominal pain: Secondary | ICD-10-CM

## 2017-09-18 DIAGNOSIS — O9A211 Injury, poisoning and certain other consequences of external causes complicating pregnancy, first trimester: Secondary | ICD-10-CM | POA: Diagnosis not present

## 2017-09-18 DIAGNOSIS — T1490XA Injury, unspecified, initial encounter: Secondary | ICD-10-CM

## 2017-09-18 DIAGNOSIS — W001XXA Fall from stairs and steps due to ice and snow, initial encounter: Secondary | ICD-10-CM

## 2017-09-18 DIAGNOSIS — Z3A09 9 weeks gestation of pregnancy: Secondary | ICD-10-CM | POA: Diagnosis not present

## 2017-09-18 DIAGNOSIS — O9989 Other specified diseases and conditions complicating pregnancy, childbirth and the puerperium: Secondary | ICD-10-CM

## 2017-09-18 MED ORDER — ACETAMINOPHEN 500 MG PO TABS: 1000.0000 mg | ORAL_TABLET | Freq: Four times a day (QID) | ORAL | 0 refills | Status: DC | PRN

## 2017-09-18 MED ORDER — DIPHENHYDRAMINE HCL 25 MG PO CAPS: 25.0000 mg | ORAL_CAPSULE | Freq: Four times a day (QID) | ORAL | 0 refills | Status: DC | PRN

## 2017-09-18 MED ORDER — METOCLOPRAMIDE HCL 10 MG PO TABS: 10.0000 mg | ORAL_TABLET | Freq: Three times a day (TID) | ORAL | 0 refills | Status: DC | PRN

## 2017-09-18 NOTE — Progress Notes (Addendum)
Kristine LarryMelanie Bhambri CNM in earlier to discuss d/c plan. Written and verbal d/c instructions given and understanding voiced. Pt signed esig but did not work

## 2017-09-18 NOTE — Discharge Instructions (Signed)
General Headache Without Cause A headache is pain or discomfort felt around the head or neck area. The specific cause of a headache may not be found. There are many causes and types of headaches. A few common ones are:  Tension headaches.  Migraine headaches.  Cluster headaches.  Chronic daily headaches.  Follow these instructions at home: Watch your condition for any changes. Take these steps to help with your condition: Managing pain  Take over-the-counter and prescription medicines only as told by your health care provider.  Lie down in a dark, quiet room when you have a headache.  If directed, apply ice to the head and neck area: ? Put ice in a plastic bag. ? Place a towel between your skin and the bag. ? Leave the ice on for 20 minutes, 2-3 times per day.  Use a heating pad or hot shower to apply heat to the head and neck area as told by your health care provider.  Keep lights dim if bright lights bother you or make your headaches worse. Eating and drinking  Eat meals on a regular schedule.  Limit alcohol use.  Decrease the amount of caffeine you drink, or stop drinking caffeine. General instructions  Keep all follow-up visits as told by your health care provider. This is important.  Keep a headache journal to help find out what may trigger your headaches. For example, write down: ? What you eat and drink. ? How much sleep you get. ? Any change to your diet or medicines.  Try massage or other relaxation techniques.  Limit stress.  Sit up straight, and do not tense your muscles.  Do not use tobacco products, including cigarettes, chewing tobacco, or e-cigarettes. If you need help quitting, ask your health care provider.  Exercise regularly as told by your health care provider.  Sleep on a regular schedule. Get 7-9 hours of sleep, or the amount recommended by your health care provider. Contact a health care provider if:  Your symptoms are not helped by  medicine.  You have a headache that is different from the usual headache.  You have nausea or you vomit.  You have a fever. Get help right away if:  Your headache becomes severe.  You have repeated vomiting.  You have a stiff neck.  You have a loss of vision.  You have problems with speech.  You have pain in the eye or ear.  You have muscular weakness or loss of muscle control.  You lose your balance or have trouble walking.  You feel faint or pass out.  You have confusion. This information is not intended to replace advice given to you by your health care provider. Make sure you discuss any questions you have with your health care provider. Document Released: 09/20/2005 Document Revised: 02/26/2016 Document Reviewed: 01/13/2015 Elsevier Interactive Patient Education  2017 Elsevier Inc.  

## 2017-09-18 NOTE — MAU Note (Signed)
Awoke pt to ask how she is feeling after meds. States is "alittle" better. H/A now 9.5

## 2017-09-29 ENCOUNTER — Inpatient Hospital Stay (HOSPITAL_COMMUNITY)
Admission: AD | Admit: 2017-09-29 | Discharge: 2017-09-30 | Disposition: A | Discharge status: Home / Self Care | Payer: Medicaid Other | Source: Ambulatory Visit | Attending: Family Medicine | Admitting: Family Medicine

## 2017-09-29 ENCOUNTER — Encounter (HOSPITAL_COMMUNITY): Payer: Self-pay | Admitting: *Deleted

## 2017-09-29 DIAGNOSIS — O26891 Other specified pregnancy related conditions, first trimester: Secondary | ICD-10-CM | POA: Diagnosis present

## 2017-09-29 DIAGNOSIS — Z91030 Bee allergy status: Secondary | ICD-10-CM | POA: Insufficient documentation

## 2017-09-29 DIAGNOSIS — O26899 Other specified pregnancy related conditions, unspecified trimester: Secondary | ICD-10-CM

## 2017-09-29 DIAGNOSIS — Z9889 Other specified postprocedural states: Secondary | ICD-10-CM | POA: Diagnosis not present

## 2017-09-29 DIAGNOSIS — O99211 Obesity complicating pregnancy, first trimester: Secondary | ICD-10-CM | POA: Insufficient documentation

## 2017-09-29 DIAGNOSIS — R1030 Lower abdominal pain, unspecified: Secondary | ICD-10-CM | POA: Diagnosis not present

## 2017-09-29 DIAGNOSIS — M549 Dorsalgia, unspecified: Secondary | ICD-10-CM | POA: Insufficient documentation

## 2017-09-29 DIAGNOSIS — R109 Unspecified abdominal pain: Secondary | ICD-10-CM

## 2017-09-29 DIAGNOSIS — N760 Acute vaginitis: Secondary | ICD-10-CM

## 2017-09-29 DIAGNOSIS — E669 Obesity, unspecified: Secondary | ICD-10-CM | POA: Insufficient documentation

## 2017-09-29 DIAGNOSIS — Z79899 Other long term (current) drug therapy: Secondary | ICD-10-CM | POA: Insufficient documentation

## 2017-09-29 DIAGNOSIS — Z3A11 11 weeks gestation of pregnancy: Secondary | ICD-10-CM | POA: Insufficient documentation

## 2017-09-29 DIAGNOSIS — O34219 Maternal care for unspecified type scar from previous cesarean delivery: Secondary | ICD-10-CM | POA: Diagnosis not present

## 2017-09-29 DIAGNOSIS — O23591 Infection of other part of genital tract in pregnancy, first trimester: Secondary | ICD-10-CM | POA: Insufficient documentation

## 2017-09-29 DIAGNOSIS — O219 Vomiting of pregnancy, unspecified: Secondary | ICD-10-CM | POA: Diagnosis not present

## 2017-09-29 DIAGNOSIS — B9689 Other specified bacterial agents as the cause of diseases classified elsewhere: Secondary | ICD-10-CM

## 2017-09-29 DIAGNOSIS — O26892 Other specified pregnancy related conditions, second trimester: Secondary | ICD-10-CM | POA: Diagnosis not present

## 2017-09-29 LAB — URINALYSIS, ROUTINE W REFLEX MICROSCOPIC
Bilirubin Urine: NEGATIVE
Glucose, UA: NEGATIVE mg/dL
HGB URINE DIPSTICK: NEGATIVE
Ketones, ur: 20 mg/dL — AB
NITRITE: NEGATIVE
PROTEIN: 100 mg/dL — AB
SPECIFIC GRAVITY, URINE: 1.023 (ref 1.005–1.030)
pH: 5 (ref 5.0–8.0)

## 2017-09-29 NOTE — MAU Note (Signed)
SPOTTING  WHEN WIPED - YESTERDAY AND  TODAY.    HAS ABD/ BACK  PAIN - STARTED ON 09-22-2017.    LAST SEX-   07-2017.

## 2017-09-30 ENCOUNTER — Encounter (HOSPITAL_COMMUNITY): Payer: Self-pay

## 2017-09-30 DIAGNOSIS — O26892 Other specified pregnancy related conditions, second trimester: Secondary | ICD-10-CM

## 2017-09-30 DIAGNOSIS — Z3A11 11 weeks gestation of pregnancy: Secondary | ICD-10-CM

## 2017-09-30 DIAGNOSIS — R109 Unspecified abdominal pain: Secondary | ICD-10-CM | POA: Diagnosis not present

## 2017-09-30 DIAGNOSIS — O219 Vomiting of pregnancy, unspecified: Secondary | ICD-10-CM

## 2017-09-30 LAB — BASIC METABOLIC PANEL
ANION GAP: 10 (ref 5–15)
BUN: 10 mg/dL (ref 6–20)
CALCIUM: 9.1 mg/dL (ref 8.9–10.3)
CO2: 23 mmol/L (ref 22–32)
CREATININE: 0.55 mg/dL (ref 0.44–1.00)
Chloride: 103 mmol/L (ref 101–111)
GFR calc Af Amer: >60 mL/min (ref >60)
GLUCOSE: 99 mg/dL (ref 65–99)
Potassium: 3.7 mmol/L (ref 3.5–5.1)
Sodium: 136 mmol/L (ref 135–145)

## 2017-09-30 LAB — WET PREP, GENITAL
Sperm: NONE SEEN
Trich, Wet Prep: NONE SEEN
YEAST WET PREP: NONE SEEN

## 2017-09-30 MED ORDER — LACTATED RINGERS IV BOLUS (SEPSIS)
1000.0000 mL | Freq: Once | INTRAVENOUS | Status: AC
  Administered 2017-09-30: 1000 mL via INTRAVENOUS

## 2017-09-30 MED ORDER — PROMETHAZINE HCL 25 MG RE SUPP: 25.0000 mg | Freq: Three times a day (TID) | RECTAL | 1 refills | Status: DC | PRN

## 2017-09-30 MED ORDER — METRONIDAZOLE 0.75 % VA GEL: 1.0000 | Freq: Every day | VAGINAL | 0 refills | Status: AC

## 2017-09-30 MED ORDER — VITAMIN B-6 50 MG PO TABS: 50.0000 mg | ORAL_TABLET | Freq: Three times a day (TID) | ORAL | 1 refills | Status: DC

## 2017-09-30 MED ORDER — DOXYLAMINE SUCCINATE (SLEEP) 25 MG PO TABS: 25.0000 mg | ORAL_TABLET | Freq: Every day | ORAL | 0 refills | Status: DC

## 2017-09-30 MED ORDER — PROMETHAZINE HCL 25 MG/ML IJ SOLN
25.0000 mg | Freq: Once | INTRAMUSCULAR | Status: AC
  Administered 2017-09-30: 25 mg via INTRAVENOUS
  Filled 2017-09-30: qty 1

## 2017-09-30 NOTE — Discharge Instructions (Signed)
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
## 2017-10-03 LAB — GC/CHLAMYDIA PROBE AMP (~~LOC~~) NOT AT ARMC
Chlamydia: NEGATIVE
Neisseria Gonorrhea: NEGATIVE

## 2017-10-04 NOTE — L&D Delivery Note (Signed)
Patient is a 27 y.o. now [id number] s/p NSVD at 5877w1d, who was admitted for SOL.  She progressed without augmentation to complete and pushed minutes to deliver.  Cord clamping delayed by 1-3 minutes then clamped by me with supervision and cut by sister of the patient.  Placenta intact and spontaneous, bleeding minimal.  1st degree vaginal wall and L labial lacerations repaired without difficulty. She requests depo for birth control.  Delivery Note At 2:42 AM a viable female was delivered via Vaginal, Spontaneous in usual fashion (Presentation: LOA) with presenting L hand.  APGAR: 9, 9; weight pending.   Placenta status: intact.  Cord: 3V with the following complications: none.  Cord pH: N/A  Anesthesia:  None Episiotomy: None Lacerations: 1st degree;Labial;Vaginal Suture Repair: 3.0 vicryl Est. Blood Loss (mL):  350  Mom to postpartum.  Baby to Couplet care / Skin to Skin.  Ellwood DenseAlison Cleveland Yarbro, DO 04/06/18, 3:27 AM

## 2017-10-06 ENCOUNTER — Encounter: Payer: Self-pay | Admitting: Obstetrics and Gynecology

## 2017-10-06 ENCOUNTER — Other Ambulatory Visit (HOSPITAL_COMMUNITY)
Admission: RE | Admit: 2017-10-06 | Discharge: 2017-10-06 | Disposition: A | Discharge status: Home / Self Care | Payer: Medicaid Other | Source: Ambulatory Visit | Attending: Obstetrics and Gynecology | Admitting: Obstetrics and Gynecology

## 2017-10-06 ENCOUNTER — Encounter: Payer: Self-pay | Admitting: *Deleted

## 2017-10-06 ENCOUNTER — Ambulatory Visit (INDEPENDENT_AMBULATORY_CARE_PROVIDER_SITE_OTHER): Payer: Medicaid Other | Admitting: Obstetrics and Gynecology

## 2017-10-06 VITALS — BP 133/82 | HR 92 | Wt 248.0 lb

## 2017-10-06 DIAGNOSIS — O09299 Supervision of pregnancy with other poor reproductive or obstetric history, unspecified trimester: Secondary | ICD-10-CM | POA: Insufficient documentation

## 2017-10-06 DIAGNOSIS — Z3482 Encounter for supervision of other normal pregnancy, second trimester: Secondary | ICD-10-CM

## 2017-10-06 DIAGNOSIS — Z98891 History of uterine scar from previous surgery: Secondary | ICD-10-CM | POA: Insufficient documentation

## 2017-10-06 DIAGNOSIS — O09292 Supervision of pregnancy with other poor reproductive or obstetric history, second trimester: Secondary | ICD-10-CM

## 2017-10-06 DIAGNOSIS — O10911 Unspecified pre-existing hypertension complicating pregnancy, first trimester: Secondary | ICD-10-CM

## 2017-10-06 DIAGNOSIS — O10912 Unspecified pre-existing hypertension complicating pregnancy, second trimester: Secondary | ICD-10-CM

## 2017-10-06 DIAGNOSIS — Z3480 Encounter for supervision of other normal pregnancy, unspecified trimester: Secondary | ICD-10-CM | POA: Diagnosis not present

## 2017-10-06 MED ORDER — DOXYLAMINE-PYRIDOXINE 10-10 MG PO TBEC: 2.0000 | DELAYED_RELEASE_TABLET | Freq: Every day | ORAL | 5 refills | Status: DC

## 2017-10-06 MED ORDER — ASPIRIN EC 81 MG PO TBEC: 81.0000 mg | DELAYED_RELEASE_TABLET | Freq: Every day | ORAL | 2 refills | Status: DC

## 2017-10-06 NOTE — Progress Notes (Signed)
Pt c/o low back pain.  Pt requesting VBAC information.
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
## 2017-10-07 ENCOUNTER — Telehealth: Payer: Self-pay | Admitting: Pediatrics

## 2017-10-07 LAB — HEMOGLOBINOPATHY EVALUATION
HEMOGLOBIN A2 QUANTITATION: 1.7 % — AB (ref 1.8–3.2)
HEMOGLOBIN F QUANTITATION: 0 % (ref 0.0–2.0)
HGB A: 98.3 % (ref 96.4–98.8)
HGB C: 0 %
HGB S: 0 %
HGB VARIANT: 0 %

## 2017-10-07 LAB — HEPATITIS B SURFACE ANTIGEN: Hepatitis B Surface Ag: NEGATIVE

## 2017-10-07 LAB — RUBELLA ANTIBODY, IGM: Rubella IgM: <20 AU/mL (ref 0.0–19.9)

## 2017-10-07 LAB — HEMOGLOBIN A1C
ESTIMATED AVERAGE GLUCOSE: 108 mg/dL
HEMOGLOBIN A1C: 5.4 % (ref 4.8–5.6)

## 2017-10-07 LAB — RPR: RPR Ser Ql: NONREACTIVE

## 2017-10-07 LAB — VITAMIN D 25 HYDROXY (VIT D DEFICIENCY, FRACTURES): Vit D, 25-Hydroxy: 16.9 ng/mL — ABNORMAL LOW (ref 30.0–100.0)

## 2017-10-07 NOTE — Telephone Encounter (Signed)
[facility] PA auth# [id number]  Rx ready for pick up at pharmacy.

## 2017-10-09 LAB — CULTURE, OB URINE

## 2017-10-09 LAB — URINE CULTURE, OB REFLEX

## 2017-10-10 LAB — CYTOLOGY - PAP
BACTERIAL VAGINITIS: POSITIVE — AB
CANDIDA VAGINITIS: POSITIVE — AB
HPV: DETECTED — AB

## 2017-10-11 ENCOUNTER — Other Ambulatory Visit: Payer: Self-pay | Admitting: Obstetrics and Gynecology

## 2017-10-11 MED ORDER — CLOTRIMAZOLE 1 % VA CREA: 1.0000 | TOPICAL_CREAM | Freq: Every day | VAGINAL | 0 refills | Status: DC

## 2017-10-11 MED ORDER — METRONIDAZOLE 500 MG PO TABS: 500.0000 mg | ORAL_TABLET | Freq: Two times a day (BID) | ORAL | 0 refills | Status: DC

## 2017-10-11 NOTE — Progress Notes (Signed)
Script for flagyl and clotrimazole sent to pharmacy.

## 2017-10-12 LAB — CYSTIC FIBROSIS MUTATION 97: GENE DIS ANAL CARRIER INTERP BLD/T-IMP: NOT DETECTED

## 2017-10-18 LAB — SPECIMEN STATUS REPORT

## 2017-10-18 LAB — RUBELLA SCREEN: RUBELLA: 1.71 {index} (ref >0.99)

## 2017-10-28 ENCOUNTER — Encounter (HOSPITAL_COMMUNITY): Payer: Self-pay

## 2017-10-28 ENCOUNTER — Inpatient Hospital Stay (HOSPITAL_COMMUNITY)
Admission: AD | Admit: 2017-10-28 | Discharge: 2017-10-28 | Disposition: A | Discharge status: Home / Self Care | Payer: Medicaid Other | Source: Ambulatory Visit | Attending: Obstetrics and Gynecology | Admitting: Obstetrics and Gynecology

## 2017-10-28 DIAGNOSIS — W109XXA Fall (on) (from) unspecified stairs and steps, initial encounter: Secondary | ICD-10-CM | POA: Diagnosis not present

## 2017-10-28 DIAGNOSIS — Z7982 Long term (current) use of aspirin: Secondary | ICD-10-CM | POA: Diagnosis not present

## 2017-10-28 DIAGNOSIS — W19XXXA Unspecified fall, initial encounter: Secondary | ICD-10-CM

## 2017-10-28 DIAGNOSIS — O26892 Other specified pregnancy related conditions, second trimester: Secondary | ICD-10-CM | POA: Diagnosis not present

## 2017-10-28 DIAGNOSIS — R109 Unspecified abdominal pain: Secondary | ICD-10-CM | POA: Diagnosis present

## 2017-10-28 DIAGNOSIS — Z3A16 16 weeks gestation of pregnancy: Secondary | ICD-10-CM

## 2017-10-28 DIAGNOSIS — Y9301 Activity, walking, marching and hiking: Secondary | ICD-10-CM | POA: Diagnosis not present

## 2017-10-28 NOTE — MAU Note (Signed)
Patient fell going up steps at work, fell on left side, denies vaginal bleeding.
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
## 2017-10-28 NOTE — Discharge Instructions (Signed)
Second Trimester of Pregnancy The second trimester is from week 13 through week 28, month 4 through 6. This is often the time in pregnancy that you feel your best. Often times, morning sickness has lessened or quit. You may have more energy, and you may get hungry more often. Your unborn baby (fetus) is growing rapidly. At the end of the sixth month, he or she is about 9 inches long and weighs about 1 pounds. You will likely feel the baby move (quickening) between 18 and 20 weeks of pregnancy. Follow these instructions at home:  Avoid all smoking, herbs, and alcohol. Avoid drugs not approved by your doctor.  Do not use any tobacco products, including cigarettes, chewing tobacco, and electronic cigarettes. If you need help quitting, ask your doctor. You may get counseling or other support to help you quit.  Only take medicine as told by your doctor. Some medicines are safe and some are not during pregnancy.  Exercise only as told by your doctor. Stop exercising if you start having cramps.  Eat regular, healthy meals.  Wear a good support bra if your breasts are tender.  Do not use hot tubs, steam rooms, or saunas.  Wear your seat belt when driving.  Avoid raw meat, uncooked cheese, and liter boxes and soil used by cats.  Take your prenatal vitamins.  Take 1500-2000 milligrams of calcium daily starting at the 20th week of pregnancy until you deliver your baby.  Try taking medicine that helps you poop (stool softener) as needed, and if your doctor approves. Eat more fiber by eating fresh fruit, vegetables, and whole grains. Drink enough fluids to keep your pee (urine) clear or pale yellow.  Take warm water baths (sitz baths) to soothe pain or discomfort caused by hemorrhoids. Use hemorrhoid cream if your doctor approves.  If you have puffy, bulging veins (varicose veins), wear support hose. Raise (elevate) your feet for 15 minutes, 3-4 times a day. Limit salt in your diet.  Avoid heavy  lifting, wear low heals, and sit up straight.  Rest with your legs raised if you have leg cramps or low back pain.  Visit your dentist if you have not gone during your pregnancy. Use a soft toothbrush to brush your teeth. Be gentle when you floss.  You can have sex (intercourse) unless your doctor tells you not to.  Go to your doctor visits. Get help if:  You feel dizzy.  You have mild cramps or pressure in your lower belly (abdomen).  You have a nagging pain in your belly area.  You continue to feel sick to your stomach (nauseous), throw up (vomit), or have watery poop (diarrhea).  You have bad smelling fluid coming from your vagina.  You have pain with peeing (urination). Get help right away if:  You have a fever.  You are leaking fluid from your vagina.  You have spotting or bleeding from your vagina.  You have severe belly cramping or pain.  You lose or gain weight rapidly.  You have trouble catching your breath and have chest pain.  You notice sudden or extreme puffiness (swelling) of your face, hands, ankles, feet, or legs.  You have not felt the baby move in over an hour.  You have severe headaches that do not go away with medicine.  You have vision changes. This information is not intended to replace advice given to you by your health care provider. Make sure you discuss any questions you have with your health care   provider. Document Released: 12/15/2009 Document Revised: 02/26/2016 Document Reviewed: 11/21/2012 Elsevier Interactive Patient Education  2017 Elsevier Inc.  

## 2017-11-03 ENCOUNTER — Encounter (HOSPITAL_COMMUNITY): Payer: Self-pay | Admitting: Obstetrics and Gynecology

## 2017-11-03 ENCOUNTER — Encounter: Payer: Self-pay | Admitting: Obstetrics and Gynecology

## 2017-11-03 ENCOUNTER — Ambulatory Visit (INDEPENDENT_AMBULATORY_CARE_PROVIDER_SITE_OTHER): Payer: Medicaid Other | Admitting: Obstetrics and Gynecology

## 2017-11-03 VITALS — BP 136/83 | HR 96 | Wt 246.7 lb

## 2017-11-03 DIAGNOSIS — O10912 Unspecified pre-existing hypertension complicating pregnancy, second trimester: Secondary | ICD-10-CM

## 2017-11-03 DIAGNOSIS — Z98891 History of uterine scar from previous surgery: Secondary | ICD-10-CM

## 2017-11-03 DIAGNOSIS — O10919 Unspecified pre-existing hypertension complicating pregnancy, unspecified trimester: Secondary | ICD-10-CM | POA: Insufficient documentation

## 2017-11-03 DIAGNOSIS — O09292 Supervision of pregnancy with other poor reproductive or obstetric history, second trimester: Secondary | ICD-10-CM

## 2017-11-03 DIAGNOSIS — Z3480 Encounter for supervision of other normal pregnancy, unspecified trimester: Secondary | ICD-10-CM

## 2017-11-03 DIAGNOSIS — R87612 Low grade squamous intraepithelial lesion on cytologic smear of cervix (LGSIL): Secondary | ICD-10-CM

## 2017-11-03 DIAGNOSIS — O09299 Supervision of pregnancy with other poor reproductive or obstetric history, unspecified trimester: Secondary | ICD-10-CM

## 2017-11-03 MED ORDER — PROMETHAZINE HCL 25 MG PO TABS: 25.0000 mg | ORAL_TABLET | Freq: Four times a day (QID) | ORAL | 2 refills | Status: DC | PRN

## 2017-11-03 NOTE — Progress Notes (Signed)
   PRENATAL VISIT NOTE  Subjective:  Kristine Perez is a 27 y.o. G2P1001 at 795w1d being seen today for ongoing prenatal care.  She is currently monitored for the following issues for this low-risk pregnancy and has GERD without esophagitis; Deep transverse arrest, persis occipitopost position, labor and del; Cesarean delivery delivered; Eye injury; Chronic hypertension complicating or reason for care during pregnancy, first trimester; Supervision of other normal pregnancy, antepartum; H/O: C-section; Current singleton pregnancy with history of congenital heart disease in prior child, antepartum; and Chronic hypertension affecting pregnancy on their problem list.  Patient reports no complaints.  Contractions: Not present. Vag. Bleeding: None.   . Denies leaking of fluid. Occasional nausea but more spitting now.  The following portions of the patient's history were reviewed and updated as appropriate: allergies, current medications, past family history, past medical history, past social history, past surgical history and problem list. Problem list updated.  Objective:   Vitals:   11/03/17 1418  BP: 136/83  Pulse: 96  Weight: 246 lb 11.2 oz (111.9 kg)    Fetal Status: Fetal Heart Rate (bpm): 152         General:  Alert, oriented and cooperative. Patient is in no acute distress.  Skin: Skin is warm and dry. No rash noted.   Cardiovascular: Normal heart rate noted  Respiratory: Normal respiratory effort, no problems with respiration noted  Abdomen: Soft, gravid, appropriate for gestational age.  Pain/Pressure: Absent     Pelvic: colpo performed, please see separate note        Extremities: Normal range of motion.  Edema: None  Mental Status:  Normal mood and affect. Normal behavior. Normal judgment and thought content.   Assessment and Plan:  Pregnancy: G2P1001 at [redacted]w[redacted]d  1. H/O: C-section  2. Supervision of other normal pregnancy, antepartum  3. Current singleton pregnancy with  history of congenital heart disease in prior child, antepartum - US Fetal Echocardiography; Future  4. Chronic hypertension affecting pregnancy Has not started baby ASA due to nausea Gave phenergan script Reviewed importance of starting aspirin  5. LGSIL on Pap smear of cervix colpo 11/03/17 Impression low grade   Preterm labor symptoms and general obstetric precautions including but not limited to vaginal bleeding, contractions, leaking of fluid and fetal movement were reviewed in detail with the patient. Please refer to After Visit Summary for other counseling recommendations.  Return in about 4 weeks (around 12/01/2017) for OB visit.   Conan BowensKelly M Lomax Poehler, MD

## 2017-11-03 NOTE — Progress Notes (Signed)
Colposcopy Procedure Note  Pre-operative Diagnosis: low grade   Indications:  LGSIL, +hrHPV Patient reports she has had multiple abnormal pap + colpo before starting age 27 but never had cervical procedure  Procedure Details  LMP n/a; UPT n/a, patient is pregnant.    The risks (including infection, bleeding, pain) and benefits of the procedure were explained to the patient and written informed consent was obtained.  The patient was placed in the dorsal lithotomy position. A Graves was speculum inserted in the vagina, and the cervix was visualized.  The cervix was stained with acetic acid and visualized using the colposcope under magnification as well as with a green filter. Findings as below. No biopsies were taken. Small amount of bleeding noted with touching of cervix with swab that improved with pressure. Patient tolerating procedure well.  Findings: minute amount of punctate lesion at 6 o'clock  Adequate: yes  Specimens: none  Condition: Stable  Complications: None  Impression: low grade, repeat colpo post partum  Plan:The patient was advised to call for any fever or for prolonged or severe pain or bleeding. She was advised to use OTC analgesics as needed for mild to moderate pain. She was advised to avoid vaginal intercourse for 48 hours or until the bleeding has completely stopped.   Baldemar LenisK. Meryl Davis, M.D. Attending Obstetrician & Gynecologist, [facility], [facility]

## 2017-11-04 ENCOUNTER — Telehealth: Payer: Self-pay | Admitting: Pediatrics

## 2017-11-04 NOTE — Telephone Encounter (Signed)
Patient called back. She states she is having some abdominal pain today.  S/p colpo yesterday.  Pt denies fever and vaginal bleeding.  She states she is "ok", but was sent home from work d/t discomfort.  Patient is requesting work note for today.  I advised her will upload note to MyChart, if symptoms worsen or do not improve by this afternoon go to MAU. She voiced understanding and agreed with plan.

## 2017-11-04 NOTE — Telephone Encounter (Signed)
Pt called office toady. She left message on nurse line voicemail. She states she is at work and having pain from colposcopy she had yesterday.  She is requesting call back.  I called patient back and LMTCB

## 2017-11-08 ENCOUNTER — Encounter: Payer: Self-pay | Admitting: *Deleted

## 2017-11-14 ENCOUNTER — Ambulatory Visit (HOSPITAL_COMMUNITY)
Admission: RE | Admit: 2017-11-14 | Discharge: 2017-11-14 | Disposition: A | Discharge status: Home / Self Care | Payer: Medicaid Other | Source: Ambulatory Visit | Attending: Obstetrics and Gynecology | Admitting: Obstetrics and Gynecology

## 2017-11-14 ENCOUNTER — Encounter (HOSPITAL_COMMUNITY): Payer: Self-pay

## 2017-11-14 ENCOUNTER — Other Ambulatory Visit: Payer: Self-pay | Admitting: Obstetrics and Gynecology

## 2017-11-14 DIAGNOSIS — Z3A18 18 weeks gestation of pregnancy: Secondary | ICD-10-CM

## 2017-11-14 DIAGNOSIS — Z3480 Encounter for supervision of other normal pregnancy, unspecified trimester: Secondary | ICD-10-CM

## 2017-11-14 DIAGNOSIS — O99210 Obesity complicating pregnancy, unspecified trimester: Secondary | ICD-10-CM

## 2017-11-14 DIAGNOSIS — Z363 Encounter for antenatal screening for malformations: Secondary | ICD-10-CM

## 2017-11-14 DIAGNOSIS — O09299 Supervision of pregnancy with other poor reproductive or obstetric history, unspecified trimester: Secondary | ICD-10-CM

## 2017-11-14 DIAGNOSIS — Z98891 History of uterine scar from previous surgery: Secondary | ICD-10-CM

## 2017-11-14 DIAGNOSIS — O10919 Unspecified pre-existing hypertension complicating pregnancy, unspecified trimester: Secondary | ICD-10-CM

## 2017-11-15 ENCOUNTER — Other Ambulatory Visit (HOSPITAL_COMMUNITY): Payer: Self-pay | Admitting: *Deleted

## 2017-11-15 DIAGNOSIS — O10919 Unspecified pre-existing hypertension complicating pregnancy, unspecified trimester: Secondary | ICD-10-CM

## 2017-11-21 ENCOUNTER — Inpatient Hospital Stay (HOSPITAL_COMMUNITY)
Admission: AD | Admit: 2017-11-21 | Discharge: 2017-11-21 | Disposition: A | Discharge status: Home / Self Care | Payer: Medicaid Other | Source: Ambulatory Visit | Attending: Obstetrics and Gynecology | Admitting: Obstetrics and Gynecology

## 2017-11-21 ENCOUNTER — Encounter (HOSPITAL_COMMUNITY): Payer: Self-pay | Admitting: *Deleted

## 2017-11-21 ENCOUNTER — Telehealth: Payer: Self-pay

## 2017-11-21 DIAGNOSIS — Z8279 Family history of other congenital malformations, deformations and chromosomal abnormalities: Secondary | ICD-10-CM | POA: Diagnosis not present

## 2017-11-21 DIAGNOSIS — Z8249 Family history of ischemic heart disease and other diseases of the circulatory system: Secondary | ICD-10-CM | POA: Insufficient documentation

## 2017-11-21 DIAGNOSIS — N949 Unspecified condition associated with female genital organs and menstrual cycle: Secondary | ICD-10-CM | POA: Diagnosis not present

## 2017-11-21 DIAGNOSIS — R1030 Lower abdominal pain, unspecified: Secondary | ICD-10-CM | POA: Diagnosis present

## 2017-11-21 DIAGNOSIS — Z3A19 19 weeks gestation of pregnancy: Secondary | ICD-10-CM | POA: Insufficient documentation

## 2017-11-21 DIAGNOSIS — O10919 Unspecified pre-existing hypertension complicating pregnancy, unspecified trimester: Secondary | ICD-10-CM

## 2017-11-21 DIAGNOSIS — R102 Pelvic and perineal pain: Secondary | ICD-10-CM | POA: Insufficient documentation

## 2017-11-21 DIAGNOSIS — Z91030 Bee allergy status: Secondary | ICD-10-CM | POA: Diagnosis not present

## 2017-11-21 DIAGNOSIS — O99212 Obesity complicating pregnancy, second trimester: Secondary | ICD-10-CM | POA: Diagnosis not present

## 2017-11-21 DIAGNOSIS — O09299 Supervision of pregnancy with other poor reproductive or obstetric history, unspecified trimester: Secondary | ICD-10-CM

## 2017-11-21 DIAGNOSIS — Z818 Family history of other mental and behavioral disorders: Secondary | ICD-10-CM | POA: Insufficient documentation

## 2017-11-21 DIAGNOSIS — E669 Obesity, unspecified: Secondary | ICD-10-CM | POA: Diagnosis not present

## 2017-11-21 DIAGNOSIS — O26892 Other specified pregnancy related conditions, second trimester: Secondary | ICD-10-CM | POA: Insufficient documentation

## 2017-11-21 DIAGNOSIS — O34219 Maternal care for unspecified type scar from previous cesarean delivery: Secondary | ICD-10-CM | POA: Diagnosis not present

## 2017-11-21 DIAGNOSIS — Z98891 History of uterine scar from previous surgery: Secondary | ICD-10-CM

## 2017-11-21 DIAGNOSIS — O10012 Pre-existing essential hypertension complicating pregnancy, second trimester: Secondary | ICD-10-CM | POA: Diagnosis not present

## 2017-11-21 LAB — URINALYSIS, ROUTINE W REFLEX MICROSCOPIC
BILIRUBIN URINE: NEGATIVE
Glucose, UA: NEGATIVE mg/dL
HGB URINE DIPSTICK: NEGATIVE
KETONES UR: NEGATIVE mg/dL
NITRITE: NEGATIVE
PROTEIN: NEGATIVE mg/dL
SPECIFIC GRAVITY, URINE: 1.028 (ref 1.005–1.030)
pH: 7 (ref 5.0–8.0)

## 2017-11-21 NOTE — Telephone Encounter (Signed)
Pt complains that that is having really bad cramping and abdominal pain in her pelvic area x1 week. Pt advised to be seen at MAU for evaluation.

## 2017-11-21 NOTE — Progress Notes (Signed)
Pt arrived with her 25 year old.  I informed her about the flu restrictions and she called her brother to come pick up her daughter.  Triage was completed fetal heart tones dopplered, pt waiting with daughter in lobby until brother arrives. House coverage and provider notified.

## 2017-11-21 NOTE — Discharge Instructions (Signed)
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
## 2017-11-21 NOTE — MAU Note (Signed)
Presents to mau with lower belly pain ( feels like the pain you feel when holding urine) that radiates down side of legs worsened by walking and  Lower back pain x 1 week.  Denies abnormal vaginal discharge.  Denies burning with urination

## 2017-11-21 NOTE — MAU Note (Addendum)
For the last week her pelvic and abdominal area has been hurting.  She states her thighs hurt even when she sits and is worse when she is walking.  Pain is a 10/10.  Denies VB/lof/discharge. Also wants her throat to be checked.  States her nephew has had a sore throat and she wants hers to be checked because "her mouth has been feeling funny".

## 2017-11-30 ENCOUNTER — Ambulatory Visit (INDEPENDENT_AMBULATORY_CARE_PROVIDER_SITE_OTHER): Payer: Medicaid Other | Admitting: Obstetrics and Gynecology

## 2017-11-30 ENCOUNTER — Encounter: Payer: Self-pay | Admitting: Obstetrics and Gynecology

## 2017-11-30 VITALS — BP 124/79 | HR 98 | Wt 248.0 lb

## 2017-11-30 DIAGNOSIS — O10919 Unspecified pre-existing hypertension complicating pregnancy, unspecified trimester: Secondary | ICD-10-CM

## 2017-11-30 DIAGNOSIS — Z98891 History of uterine scar from previous surgery: Secondary | ICD-10-CM

## 2017-11-30 DIAGNOSIS — O09299 Supervision of pregnancy with other poor reproductive or obstetric history, unspecified trimester: Secondary | ICD-10-CM

## 2017-11-30 DIAGNOSIS — Z3480 Encounter for supervision of other normal pregnancy, unspecified trimester: Secondary | ICD-10-CM

## 2017-11-30 NOTE — Progress Notes (Signed)
   PRENATAL VISIT NOTE  Subjective:  Kristine Perez is a 27 y.o. G2P1001 at 375w0d being seen today for ongoing prenatal care.  She is currently monitored for the following issues for this high-risk pregnancy and has GERD without esophagitis; Deep transverse arrest, persis occipitopost position, labor and del; Cesarean delivery delivered; Eye injury; Chronic hypertension complicating or reason for care during pregnancy, first trimester; Supervision of other normal pregnancy, antepartum; H/O: C-section; Current singleton pregnancy with history of congenital heart disease in prior child, antepartum; and Chronic hypertension affecting pregnancy on their problem list.  Patient reports round ligament pain.  Was seen in MAU, repots pain is mildly improved since then. Contractions: Not present. Vag. Bleeding: None.  Movement: Present. Denies leaking of fluid.   The following portions of the patient's history were reviewed and updated as appropriate: allergies, current medications, past family history, past medical history, past social history, past surgical history and problem list. Problem list updated.  Objective:   Vitals:   11/30/17 1628  BP: 124/79  Pulse: 98  Weight: 248 lb (112.5 kg)    Fetal Status: Fetal Heart Rate (bpm): 156   Movement: Present     General:  Alert, oriented and cooperative. Patient is in no acute distress.  Skin: Skin is warm and dry. No rash noted.   Cardiovascular: Normal heart rate noted  Respiratory: Normal respiratory effort, no problems with respiration noted  Abdomen: Soft, gravid, appropriate for gestational age.  Pain/Pressure: Absent     Pelvic: Cervical exam deferred        Extremities: Normal range of motion.  Edema: Trace  Mental Status:  Normal mood and affect. Normal behavior. Normal judgment and thought content.   Assessment and Plan:  Pregnancy: G2P1001 at 425w0d  1. Supervision of other normal pregnancy, antepartum  2. Current singleton  pregnancy with history of congenital heart disease in prior child, antepartum Has fetal echo scheduled for 3/12  3. H/O: C-section  4. Chronic hypertension affecting pregnancy BP stable No meds Cont baby ASA Last growth 42th%tile   Preterm labor symptoms and general obstetric precautions including but not limited to vaginal bleeding, contractions, leaking of fluid and fetal movement were reviewed in detail with the patient. Please refer to After Visit Summary for other counseling recommendations.  Return in about 3 weeks (around 12/21/2017) for OB visit (MD).   Conan BowensKelly M Diamante Truszkowski, MD

## 2017-12-12 ENCOUNTER — Telehealth: Payer: Self-pay | Admitting: *Deleted

## 2017-12-12 ENCOUNTER — Encounter (HOSPITAL_COMMUNITY): Payer: Self-pay

## 2017-12-12 ENCOUNTER — Other Ambulatory Visit: Payer: Self-pay

## 2017-12-12 ENCOUNTER — Emergency Department (HOSPITAL_COMMUNITY)
Admission: EM | Admit: 2017-12-12 | Discharge: 2017-12-12 | Disposition: A | Discharge status: Home / Self Care | Payer: Medicaid Other | Attending: Emergency Medicine | Admitting: Emergency Medicine

## 2017-12-12 DIAGNOSIS — R509 Fever, unspecified: Secondary | ICD-10-CM | POA: Diagnosis present

## 2017-12-12 DIAGNOSIS — J111 Influenza due to unidentified influenza virus with other respiratory manifestations: Secondary | ICD-10-CM | POA: Insufficient documentation

## 2017-12-12 DIAGNOSIS — R69 Illness, unspecified: Secondary | ICD-10-CM

## 2017-12-12 LAB — INFLUENZA PANEL BY PCR (TYPE A & B)
Influenza A By PCR: NEGATIVE
Influenza B By PCR: NEGATIVE

## 2017-12-12 MED ORDER — OSELTAMIVIR PHOSPHATE 75 MG PO CAPS: 75.0000 mg | ORAL_CAPSULE | Freq: Two times a day (BID) | ORAL | 0 refills | Status: DC
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
## 2017-12-12 NOTE — Discharge Instructions (Signed)
Please read attached information. If you experience any new or worsening signs or symptoms please return to the emergency room for evaluation. Please contact your OB/GYN and inform them today's visit and relevant data and our recommendation for initiate Tamiflu.  Please use medication prescribed only as directed and discontinue taking if you have any concerning signs or symptoms.

## 2017-12-12 NOTE — Telephone Encounter (Signed)
Pt called to office stating she was tested for Flu today and given Tamiflu. Pt was advised to contact our office to verify if she can take due to pregnancy.  Pt made aware that she may take medication, Tylenol as needed and increase PO fluids. Pt advised to contact office if any other concerns.

## 2017-12-12 NOTE — ED Triage Notes (Signed)
Pt here for body aches, sore throat, nausea, cough, chills, fever at home. She is 1831w5d pregnant. Pt states she has had temp as high as 102 at home. Pt reports  Normal fetal movement.

## 2017-12-13 ENCOUNTER — Other Ambulatory Visit (HOSPITAL_COMMUNITY): Payer: Self-pay | Admitting: Obstetrics and Gynecology

## 2017-12-13 ENCOUNTER — Encounter (HOSPITAL_COMMUNITY): Payer: Self-pay

## 2017-12-13 ENCOUNTER — Ambulatory Visit (HOSPITAL_COMMUNITY)
Admission: RE | Admit: 2017-12-13 | Discharge: 2017-12-13 | Disposition: A | Discharge status: Home / Self Care | Payer: Medicaid Other | Source: Ambulatory Visit | Attending: Certified Nurse Midwife | Admitting: Certified Nurse Midwife

## 2017-12-13 DIAGNOSIS — O10019 Pre-existing essential hypertension complicating pregnancy, unspecified trimester: Secondary | ICD-10-CM | POA: Insufficient documentation

## 2017-12-13 DIAGNOSIS — O99212 Obesity complicating pregnancy, second trimester: Secondary | ICD-10-CM | POA: Diagnosis not present

## 2017-12-13 DIAGNOSIS — O10919 Unspecified pre-existing hypertension complicating pregnancy, unspecified trimester: Secondary | ICD-10-CM

## 2017-12-13 DIAGNOSIS — Z3A22 22 weeks gestation of pregnancy: Secondary | ICD-10-CM

## 2017-12-13 DIAGNOSIS — E669 Obesity, unspecified: Secondary | ICD-10-CM | POA: Insufficient documentation

## 2017-12-13 DIAGNOSIS — O09299 Supervision of pregnancy with other poor reproductive or obstetric history, unspecified trimester: Secondary | ICD-10-CM

## 2017-12-13 DIAGNOSIS — Z98891 History of uterine scar from previous surgery: Secondary | ICD-10-CM

## 2017-12-13 DIAGNOSIS — Z362 Encounter for other antenatal screening follow-up: Secondary | ICD-10-CM | POA: Insufficient documentation

## 2017-12-14 ENCOUNTER — Other Ambulatory Visit (HOSPITAL_COMMUNITY): Payer: Self-pay | Admitting: *Deleted

## 2017-12-14 DIAGNOSIS — O10919 Unspecified pre-existing hypertension complicating pregnancy, unspecified trimester: Secondary | ICD-10-CM

## 2017-12-16 ENCOUNTER — Encounter: Payer: Self-pay | Admitting: *Deleted

## 2017-12-21 ENCOUNTER — Encounter: Payer: Self-pay | Admitting: Obstetrics and Gynecology

## 2017-12-21 ENCOUNTER — Encounter: Payer: Self-pay | Admitting: *Deleted

## 2017-12-21 ENCOUNTER — Ambulatory Visit (INDEPENDENT_AMBULATORY_CARE_PROVIDER_SITE_OTHER): Payer: Medicaid Other | Admitting: Obstetrics and Gynecology

## 2017-12-21 VITALS — BP 128/78 | HR 93 | Wt 248.0 lb

## 2017-12-21 DIAGNOSIS — O10913 Unspecified pre-existing hypertension complicating pregnancy, third trimester: Secondary | ICD-10-CM

## 2017-12-21 DIAGNOSIS — O09293 Supervision of pregnancy with other poor reproductive or obstetric history, third trimester: Secondary | ICD-10-CM

## 2017-12-21 DIAGNOSIS — S0592XD Unspecified injury of left eye and orbit, subsequent encounter: Secondary | ICD-10-CM

## 2017-12-21 DIAGNOSIS — Z3480 Encounter for supervision of other normal pregnancy, unspecified trimester: Secondary | ICD-10-CM

## 2017-12-21 DIAGNOSIS — O10919 Unspecified pre-existing hypertension complicating pregnancy, unspecified trimester: Secondary | ICD-10-CM

## 2017-12-21 DIAGNOSIS — Z98891 History of uterine scar from previous surgery: Secondary | ICD-10-CM

## 2017-12-21 DIAGNOSIS — O09299 Supervision of pregnancy with other poor reproductive or obstetric history, unspecified trimester: Secondary | ICD-10-CM

## 2017-12-21 NOTE — Progress Notes (Signed)
   PRENATAL VISIT NOTE  Subjective:  Kristine Perez is a 27 y.o. G2P1001 at 3235w0d being seen today for ongoing prenatal care.  She is currently monitored for the following issues for this high-risk pregnancy and has GERD without esophagitis; Deep transverse arrest, persis occipitopost position, labor and del; Cesarean delivery delivered; Eye injury; Chronic hypertension complicating or reason for care during pregnancy, first trimester; Supervision of other normal pregnancy, antepartum; H/O: C-section; Current singleton pregnancy with history of congenital heart disease in prior child, antepartum; and Chronic hypertension affecting pregnancy on their problem list.  Patient reports no complaints.  Contractions: Not present. Vag. Bleeding: None.  Movement: Present. Denies leaking of fluid.   The following portions of the patient's history were reviewed and updated as appropriate: allergies, current medications, past family history, past medical history, past social history, past surgical history and problem list. Problem list updated.  Objective:   Vitals:   12/21/17 1551  BP: 128/78  Pulse: 93  Weight: 248 lb (112.5 kg)    Fetal Status: Fetal Heart Rate (bpm): 154   Movement: Present     General:  Alert, oriented and cooperative. Patient is in no acute distress.  Skin: Skin is warm and dry. No rash noted.   Cardiovascular: Normal heart rate noted  Respiratory: Normal respiratory effort, no problems with respiration noted  Abdomen: Soft, gravid, appropriate for gestational age.  Pain/Pressure: Present     Pelvic: Cervical exam deferred        Extremities: Normal range of motion.  Edema: Trace  Mental Status:  Normal mood and affect. Normal behavior. Normal judgment and thought content.   Assessment and Plan:  Pregnancy: G2P1001 at 3835w0d  1. Chronic hypertension affecting pregnancy Cont baby ASA No meds stable BP  2. H/O: C-section Reviewed risks/benefits of TOLAC versus RCS in  detail. Patient counseled regarding potential vaginal delivery, chance of success, future implications, possible uterine rupture and need for urgent/emergent repeat cesarean. Counseled regarding potential need for repeat c-section for reasons unrelated to first c-section. Counseled regarding scheduled repeat cesarean including risks of bleeding, infection, damage to surrounding tissue, abnormal placentation, implications for future pregnancies. All questions answered.  Patient desires TOLAC, consent signed 12/21/2017.  3. Supervision of other normal pregnancy, antepartum  4. Left eye injury, subsequent encounter  5. Current singleton pregnancy with history of congenital heart disease in prior child, antepartum Fetal echo normal 3/12  Preterm labor symptoms and general obstetric precautions including but not limited to vaginal bleeding, contractions, leaking of fluid and fetal movement were reviewed in detail with the patient. Please refer to After Visit Summary for other counseling recommendations.  Return in about 3 weeks (around 01/11/2018) for OB visit (MD), 2 hr GTT, 3rd trim labs.   Conan BowensKelly M Davis, MD

## 2017-12-21 NOTE — Patient Instructions (Signed)
Contraception Choices Contraception, also called birth control, refers to methods or devices that prevent pregnancy. Hormonal methods Contraceptive implant A contraceptive implant is a thin, plastic tube that contains a hormone. It is inserted into the upper part of the arm. It can remain in place for up to 3 years. Progestin-only injections Progestin-only injections are injections of progestin, a synthetic form of the hormone progesterone. They are given every 3 months by a health care provider. Birth control pills Birth control pills are pills that contain hormones that prevent pregnancy. They must be taken once a day, preferably at the same time each day. Birth control patch The birth control patch contains hormones that prevent pregnancy. It is placed on the skin and must be changed once a week for three weeks and removed on the fourth week. A prescription is needed to use this method of contraception. Vaginal ring A vaginal ring contains hormones that prevent pregnancy. It is placed in the vagina for three weeks and removed on the fourth week. After that, the process is repeated with a new ring. A prescription is needed to use this method of contraception. Emergency contraceptive Emergency contraceptives prevent pregnancy after unprotected sex. They come in pill form and can be taken up to 5 days after sex. They work best the sooner they are taken after having sex. Most emergency contraceptives are available without a prescription. This method should not be used as your only form of birth control. Barrier methods Female condom A female condom is a thin sheath that is worn over the penis during sex. Condoms keep sperm from going inside a woman's body. They can be used with a spermicide to increase their effectiveness. They should be disposed after a single use. Female condom A female condom is a soft, loose-fitting sheath that is put into the vagina before sex. The condom keeps sperm from going  inside a woman's body. They should be disposed after a single use. Intrauterine contraception Intrauterine device (IUD) An IUD is a T-shaped device that is put in a woman's uterus. There are two types:  Hormone IUD.This type contains progestin, a synthetic form of the hormone progesterone. This type can stay in place for 3-5 years.  Copper IUD.This type is wrapped in copper wire. It can stay in place for 10 years.  Permanent methods of contraception Female tubal ligation In this method, a woman's fallopian tubes are sealed, tied, or blocked during surgery to prevent eggs from traveling to the uterus. Hysteroscopic sterilization In this method, a small, flexible insert is placed into each fallopian tube. The inserts cause scar tissue to form in the fallopian tubes and block them, so sperm cannot reach an egg. The procedure takes about 3 months to be effective. Another form of birth control must be used during those 3 months. Female sterilization This is a procedure to tie off the tubes that carry sperm (vasectomy). After the procedure, the man can still ejaculate fluid (semen). Natural planning methods Natural family planning In this method, a couple does not have sex on days when the woman could become pregnant. Calendar method This means keeping track of the length of each menstrual cycle, identifying the days when pregnancy can happen, and not having sex on those days. Ovulation method In this method, a couple avoids sex during ovulation. Symptothermal method This method involves not having sex during ovulation. The woman typically checks for ovulation by watching changes in her temperature and in the consistency of cervical mucus. Post-ovulation method In this  this method, a couple waits to have sex until after ovulation. Summary  Contraception, also called birth control, means methods or devices that prevent pregnancy.  Hormonal methods of contraception include implants, injections,  pills, patches, vaginal rings, and emergency contraceptives.  Barrier methods of contraception can include female condoms, female condoms, diaphragms, cervical caps, sponges, and spermicides.  There are two types of IUDs (intrauterine devices). An IUD can be put in a woman's uterus to prevent pregnancy for 3-5 years.  Permanent sterilization can be done through a procedure for males, females, or both.  Natural family planning methods involve not having sex on days when the woman could become pregnant. This information is not intended to replace advice given to you by your health care provider. Make sure you discuss any questions you have with your health care provider. Document Released: 09/20/2005 Document Revised: 10/23/2016 Document Reviewed: 10/23/2016 Elsevier Interactive Patient Education  2018 [employer]  

## 2017-12-21 NOTE — Progress Notes (Signed)
ROB reports no problems today. 

## 2018-01-02 ENCOUNTER — Telehealth: Payer: Self-pay

## 2018-01-02 ENCOUNTER — Inpatient Hospital Stay (HOSPITAL_COMMUNITY)
Admission: AD | Admit: 2018-01-02 | Discharge: 2018-01-02 | Disposition: A | Discharge status: Home / Self Care | Payer: Medicaid Other | Source: Ambulatory Visit | Attending: Obstetrics and Gynecology | Admitting: Obstetrics and Gynecology

## 2018-01-02 ENCOUNTER — Other Ambulatory Visit: Payer: Self-pay

## 2018-01-02 ENCOUNTER — Encounter (HOSPITAL_COMMUNITY): Payer: Self-pay | Admitting: *Deleted

## 2018-01-02 DIAGNOSIS — Z3492 Encounter for supervision of normal pregnancy, unspecified, second trimester: Secondary | ICD-10-CM

## 2018-01-02 DIAGNOSIS — M62830 Muscle spasm of back: Secondary | ICD-10-CM | POA: Insufficient documentation

## 2018-01-02 DIAGNOSIS — Z3A25 25 weeks gestation of pregnancy: Secondary | ICD-10-CM | POA: Diagnosis not present

## 2018-01-02 DIAGNOSIS — O09299 Supervision of pregnancy with other poor reproductive or obstetric history, unspecified trimester: Secondary | ICD-10-CM

## 2018-01-02 DIAGNOSIS — R200 Anesthesia of skin: Secondary | ICD-10-CM | POA: Insufficient documentation

## 2018-01-02 DIAGNOSIS — M545 Low back pain: Secondary | ICD-10-CM | POA: Diagnosis not present

## 2018-01-02 DIAGNOSIS — O10919 Unspecified pre-existing hypertension complicating pregnancy, unspecified trimester: Secondary | ICD-10-CM

## 2018-01-02 DIAGNOSIS — M5430 Sciatica, unspecified side: Secondary | ICD-10-CM

## 2018-01-02 DIAGNOSIS — O10912 Unspecified pre-existing hypertension complicating pregnancy, second trimester: Secondary | ICD-10-CM

## 2018-01-02 DIAGNOSIS — O26892 Other specified pregnancy related conditions, second trimester: Secondary | ICD-10-CM | POA: Diagnosis not present

## 2018-01-02 DIAGNOSIS — Z98891 History of uterine scar from previous surgery: Secondary | ICD-10-CM

## 2018-01-02 LAB — URINALYSIS, ROUTINE W REFLEX MICROSCOPIC
Bilirubin Urine: NEGATIVE
Glucose, UA: NEGATIVE mg/dL
Hgb urine dipstick: NEGATIVE
KETONES UR: NEGATIVE mg/dL
Nitrite: NEGATIVE
PH: 8 (ref 5.0–8.0)
Protein, ur: 30 mg/dL — AB
Specific Gravity, Urine: 1.029 (ref 1.005–1.030)

## 2018-01-02 MED ORDER — CYCLOBENZAPRINE HCL 10 MG PO TABS: 10.0000 mg | ORAL_TABLET | Freq: Three times a day (TID) | ORAL | 0 refills | Status: DC | PRN
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
## 2018-01-02 NOTE — Telephone Encounter (Signed)
Pt called stating that she is having right leg pain with swelling. She states that her leg is going numb, and it's hard for her to walk at times. Pt has tried taking tylenol, warm compresses with no relief. Pt advised to be evaluated at MAU for the swelling and pain since she is not getting any relief

## 2018-01-02 NOTE — Discharge Instructions (Signed)
Second Trimester of Pregnancy The second trimester is from week 13 through week 28, month 4 through 6. This is often the time in pregnancy that you feel your best. Often times, morning sickness has lessened or quit. You may have more energy, and you may get hungry more often. Your unborn baby (fetus) is growing rapidly. At the end of the sixth month, he or she is about 9 inches long and weighs about 1 pounds. You will likely feel the baby move (quickening) between 18 and 20 weeks of pregnancy. Follow these instructions at home:  Avoid all smoking, herbs, and alcohol. Avoid drugs not approved by your doctor.  Do not use any tobacco products, including cigarettes, chewing tobacco, and electronic cigarettes. If you need help quitting, ask your doctor. You may get counseling or other support to help you quit.  Only take medicine as told by your doctor. Some medicines are safe and some are not during pregnancy.  Exercise only as told by your doctor. Stop exercising if you start having cramps.  Eat regular, healthy meals.  Wear a good support bra if your breasts are tender.  Do not use hot tubs, steam rooms, or saunas.  Wear your seat belt when driving.  Avoid raw meat, uncooked cheese, and liter boxes and soil used by cats.  Take your prenatal vitamins.  Take 1500-2000 milligrams of calcium daily starting at the 20th week of pregnancy until you deliver your baby.  Try taking medicine that helps you poop (stool softener) as needed, and if your doctor approves. Eat more fiber by eating fresh fruit, vegetables, and whole grains. Drink enough fluids to keep your pee (urine) clear or pale yellow.  Take warm water baths (sitz baths) to soothe pain or discomfort caused by hemorrhoids. Use hemorrhoid cream if your doctor approves.  If you have puffy, bulging veins (varicose veins), wear support hose. Raise (elevate) your feet for 15 minutes, 3-4 times a day. Limit salt in your diet.  Avoid heavy  lifting, wear low heals, and sit up straight.  Rest with your legs raised if you have leg cramps or low back pain.  Visit your dentist if you have not gone during your pregnancy. Use a soft toothbrush to brush your teeth. Be gentle when you floss.  You can have sex (intercourse) unless your doctor tells you not to.  Go to your doctor visits. Get help if:  You feel dizzy.  You have mild cramps or pressure in your lower belly (abdomen).  You have a nagging pain in your belly area.  You continue to feel sick to your stomach (nauseous), throw up (vomit), or have watery poop (diarrhea).  You have bad smelling fluid coming from your vagina.  You have pain with peeing (urination). Get help right away if:  You have a fever.  You are leaking fluid from your vagina.  You have spotting or bleeding from your vagina.  You have severe belly cramping or pain.  You lose or gain weight rapidly.  You have trouble catching your breath and have chest pain.  You notice sudden or extreme puffiness (swelling) of your face, hands, ankles, feet, or legs.  You have not felt the baby move in over an hour.  You have severe headaches that do not go away with medicine.  You have vision changes. This information is not intended to replace advice given to you by your health care provider. Make sure you discuss any questions you have with your health care  provider. Document Released: 12/15/2009 Document Revised: 02/26/2016 Document Reviewed: 11/21/2012 Elsevier Interactive Patient Education  2017 [employer] Sciatica Sciatica is pain, numbness, weakness, or tingling along your sciatic nerve. The sciatic nerve starts in the lower back and goes down the back of each leg. Sciatica happens when this nerve is pinched or has pressure put on it. Sciatica usually goes away on its own or with treatment. Sometimes, sciatica may keep coming back (recur). Follow these instructions at home: Medicines  Take  over-the-counter and prescription medicines only as told by your doctor.  Do not drive or use heavy machinery while taking prescription pain medicine. Managing pain  If directed, put ice on the affected area. ? Put ice in a plastic bag. ? Place a towel between your skin and the bag. ? Leave the ice on for 20 minutes, 2-3 times a day.  After icing, apply heat to the affected area before you exercise or as often as told by your doctor. Use the heat source that your doctor tells you to use, such as a moist heat pack or a heating pad. ? Place a towel between your skin and the heat source. ? Leave the heat on for 20-30 minutes. ? Remove the heat if your skin turns bright red. This is especially important if you are unable to feel pain, heat, or cold. You may have a greater risk of getting burned. Activity  Return to your normal activities as told by your doctor. Ask your doctor what activities are safe for you. ? Avoid activities that make your sciatica worse.  Take short rests during the day. Rest in a lying or standing position. This is usually better than sitting to rest. ? When you rest for a long time, do some physical activity or stretching between periods of rest. ? Avoid sitting for a long time without moving. Get up and move around at least one time each hour.  Exercise and stretch regularly, as told by your doctor.  Do not lift anything that is heavier than 10 lb (4.5 kg) while you have symptoms of sciatica. ? Avoid lifting heavy things even when you do not have symptoms. ? Avoid lifting heavy things over and over.  When you lift objects, always lift in a way that is safe for your body. To do this, you should: ? Bend your knees. ? Keep the object close to your body. ? Avoid twisting. General instructions  Use good posture. ? Avoid leaning forward when you are sitting. ? Avoid hunching over when you are standing.  Stay at a healthy weight.  Wear comfortable shoes that  support your feet. Avoid wearing high heels.  Avoid sleeping on a mattress that is too soft or too hard. You might have less pain if you sleep on a mattress that is firm enough to support your back.  Keep all follow-up visits as told by your doctor. This is important. Contact a doctor if:  You have pain that: ? Wakes you up when you are sleeping. ? Gets worse when you lie down. ? Is worse than the pain you have had in the past. ? Lasts longer than 4 weeks.  You lose weight for without trying. Get help right away if:  You cannot control when you pee (urinate) or poop (have a bowel movement).  You have weakness in any of these areas and it gets worse. ? Lower back. ? Lower belly (pelvis). ? Butt (buttocks). ? Legs.  You have redness or swelling  of your back.  You have a burning feeling when you pee. This information is not intended to replace advice given to you by your health care provider. Make sure you discuss any questions you have with your health care provider. Document Released: 06/29/2008 Document Revised: 02/26/2016 Document Reviewed: 05/30/2015 Elsevier Interactive Patient Education  [employer].

## 2018-01-02 NOTE — MAU Note (Signed)
Past couple of nights, has felt numbness in her legs and back spasms,  Had sciatic issues with her last preg. At times she feels paralyzed.  Today her legs are swollen, so her dr wanted her to come in.  Denies HA, visual changes, epigastric pain, no bleeding or leaking.  Reports +FM.

## 2018-01-10 ENCOUNTER — Encounter (HOSPITAL_COMMUNITY): Payer: Self-pay

## 2018-01-10 ENCOUNTER — Ambulatory Visit (HOSPITAL_COMMUNITY)
Admission: RE | Admit: 2018-01-10 | Discharge: 2018-01-10 | Disposition: A | Discharge status: Home / Self Care | Payer: Medicaid Other | Source: Ambulatory Visit | Attending: Certified Nurse Midwife | Admitting: Certified Nurse Midwife

## 2018-01-10 ENCOUNTER — Other Ambulatory Visit (HOSPITAL_COMMUNITY): Payer: Self-pay | Admitting: Maternal and Fetal Medicine

## 2018-01-10 DIAGNOSIS — O10019 Pre-existing essential hypertension complicating pregnancy, unspecified trimester: Secondary | ICD-10-CM

## 2018-01-10 DIAGNOSIS — Z3A26 26 weeks gestation of pregnancy: Secondary | ICD-10-CM

## 2018-01-10 DIAGNOSIS — Z362 Encounter for other antenatal screening follow-up: Secondary | ICD-10-CM | POA: Diagnosis not present

## 2018-01-10 DIAGNOSIS — O99212 Obesity complicating pregnancy, second trimester: Secondary | ICD-10-CM

## 2018-01-10 DIAGNOSIS — Z98891 History of uterine scar from previous surgery: Secondary | ICD-10-CM

## 2018-01-10 DIAGNOSIS — O10919 Unspecified pre-existing hypertension complicating pregnancy, unspecified trimester: Secondary | ICD-10-CM

## 2018-01-10 DIAGNOSIS — O10012 Pre-existing essential hypertension complicating pregnancy, second trimester: Secondary | ICD-10-CM | POA: Diagnosis not present

## 2018-01-10 DIAGNOSIS — O09299 Supervision of pregnancy with other poor reproductive or obstetric history, unspecified trimester: Secondary | ICD-10-CM

## 2018-01-10 NOTE — Addendum Note (Signed)
Encounter addended by: Genevie CheshireWaken, Michai Dieppa M, RT on: 01/10/2018 4:38 PM  Actions taken: Imaging Exam ended

## 2018-01-11 ENCOUNTER — Encounter: Payer: Self-pay | Admitting: Obstetrics and Gynecology

## 2018-01-11 ENCOUNTER — Ambulatory Visit (INDEPENDENT_AMBULATORY_CARE_PROVIDER_SITE_OTHER): Payer: Medicaid Other | Admitting: Obstetrics and Gynecology

## 2018-01-11 ENCOUNTER — Other Ambulatory Visit (HOSPITAL_COMMUNITY): Payer: Self-pay | Admitting: *Deleted

## 2018-01-11 ENCOUNTER — Other Ambulatory Visit: Payer: Medicaid Other

## 2018-01-11 VITALS — BP 110/72 | HR 106 | Wt 251.0 lb

## 2018-01-11 DIAGNOSIS — O09299 Supervision of pregnancy with other poor reproductive or obstetric history, unspecified trimester: Secondary | ICD-10-CM

## 2018-01-11 DIAGNOSIS — Z3480 Encounter for supervision of other normal pregnancy, unspecified trimester: Secondary | ICD-10-CM

## 2018-01-11 DIAGNOSIS — O10912 Unspecified pre-existing hypertension complicating pregnancy, second trimester: Secondary | ICD-10-CM

## 2018-01-11 DIAGNOSIS — O359XX0 Maternal care for (suspected) fetal abnormality and damage, unspecified, not applicable or unspecified: Secondary | ICD-10-CM

## 2018-01-11 DIAGNOSIS — O10919 Unspecified pre-existing hypertension complicating pregnancy, unspecified trimester: Secondary | ICD-10-CM

## 2018-01-11 DIAGNOSIS — O09292 Supervision of pregnancy with other poor reproductive or obstetric history, second trimester: Secondary | ICD-10-CM

## 2018-01-11 DIAGNOSIS — Z98891 History of uterine scar from previous surgery: Secondary | ICD-10-CM

## 2018-01-11 MED ORDER — ONDANSETRON 4 MG PO TBDP: 4.0000 mg | ORAL_TABLET | Freq: Four times a day (QID) | ORAL | 1 refills | Status: DC | PRN

## 2018-01-11 NOTE — Progress Notes (Signed)
Subjective:  Kristine Perez is a 27 y.o. G2P1001 at 5640w0d being seen today for ongoing prenatal care.  She is currently monitored for the following issues for this high-risk pregnancy and has GERD without esophagitis; Deep transverse arrest, persis occipitopost position, labor and del; Eye injury; Supervision of other normal pregnancy, antepartum; H/O: C-section; Current singleton pregnancy with history of congenital heart disease in prior child, antepartum; Chronic hypertension affecting pregnancy; and Suspected fetal anomaly, antepartum on their problem list.  Patient reports some sicatic pain. Flexeril does not help. Needs note for work. Was unable to tolertate glucola.  Contractions: Not present. Vag. Bleeding: None.  Movement: Present. Denies leaking of fluid.   The following portions of the patient's history were reviewed and updated as appropriate: allergies, current medications, past family history, past medical history, past social history, past surgical history and problem list. Problem list updated.  Objective:   Vitals:   01/11/18 0852  BP: 110/72  Pulse: (!) 106  Weight: 251 lb (113.9 kg)    Fetal Status: Fetal Heart Rate (bpm): 148   Movement: Present     General:  Alert, oriented and cooperative. Patient is in no acute distress.  Skin: Skin is warm and dry. No rash noted.   Cardiovascular: Normal heart rate noted  Respiratory: Normal respiratory effort, no problems with respiration noted  Abdomen: Soft, gravid, appropriate for gestational age. Pain/Pressure: Present     Pelvic:  Cervical exam deferred        Extremities: Normal range of motion.  Edema: Trace  Mental Status: Normal mood and affect. Normal behavior. Normal judgment and thought content.   Urinalysis:      Assessment and Plan:  Pregnancy: G2P1001 at 7540w0d  1. Supervision of other normal pregnancy, antepartum Will have pt take Zofran 30 minutes to 1 hr prior to glucola. Reschedule glucola to next  week Note for work provided to allow to walk as need and wear tennis shoes 2. Chronic hypertension affecting pregnancy BP stable, without meds Continue with BASA Growth nl yesterday  3. Current singleton pregnancy with history of congenital heart disease in prior child, antepartum Nl fetal ECHO  4. H/O: C-section Desires VBAC consent has been signed  5. Suspected fetal anomaly, antepartum, single or unspecified fetus Possible equinovarus. F/U scan ordered  Preterm labor symptoms and general obstetric precautions including but not limited to vaginal bleeding, contractions, leaking of fluid and fetal movement were reviewed in detail with the patient. Please refer to After Visit Summary for other counseling recommendations.  No follow-ups on file.   Hermina StaggersErvin, Arvis Miguez L, MD

## 2018-01-11 NOTE — Progress Notes (Signed)
Pt unable to do 2hr GTT. Tdap handout given will decide nv

## 2018-01-11 NOTE — Patient Instructions (Signed)
Third Trimester of Pregnancy The third trimester is from week 28 through week 40 (months 7 through 9). The third trimester is a time when the unborn baby (fetus) is growing rapidly. At the end of the ninth month, the fetus is about 20 inches in length and weighs 6-10 pounds. Body changes during your third trimester Your body will continue to go through many changes during pregnancy. The changes vary from woman to woman. During the third trimester:  Your weight will continue to increase. You can expect to gain 25-35 pounds (11-16 kg) by the end of the pregnancy.  You may begin to get stretch marks on your hips, abdomen, and breasts.  You may urinate more often because the fetus is moving lower into your pelvis and pressing on your bladder.  You may develop or continue to have heartburn. This is caused by increased hormones that slow down muscles in the digestive tract.  You may develop or continue to have constipation because increased hormones slow digestion and cause the muscles that push waste through your intestines to relax.  You may develop hemorrhoids. These are swollen veins (varicose veins) in the rectum that can itch or be painful.  You may develop swollen, bulging veins (varicose veins) in your legs.  You may have increased body aches in the pelvis, back, or thighs. This is due to weight gain and increased hormones that are relaxing your joints.  You may have changes in your hair. These can include thickening of your hair, rapid growth, and changes in texture. Some women also have hair loss during or after pregnancy, or hair that feels dry or thin. Your hair will most likely return to normal after your baby is born.  Your breasts will continue to grow and they will continue to become tender. A yellow fluid (colostrum) may leak from your breasts. This is the first milk you are producing for your baby.  Your belly button may stick out.  You may notice more swelling in your hands,  face, or ankles.  You may have increased tingling or numbness in your hands, arms, and legs. The skin on your belly may also feel numb.  You may feel short of breath because of your expanding uterus.  You may have more problems sleeping. This can be caused by the size of your belly, increased need to urinate, and an increase in your body's metabolism.  You may notice the fetus "dropping," or moving lower in your abdomen (lightening).  You may have increased vaginal discharge.  You may notice your joints feel loose and you may have pain around your pelvic bone.  What to expect at prenatal visits You will have prenatal exams every 2 weeks until week 36. Then you will have weekly prenatal exams. During a routine prenatal visit:  You will be weighed to make sure you and the baby are growing normally.  Your blood pressure will be taken.  Your abdomen will be measured to track your baby's growth.  The fetal heartbeat will be listened to.  Any test results from the previous visit will be discussed.  You may have a cervical check near your due date to see if your cervix has softened or thinned (effaced).  You will be tested for Group B streptococcus. This happens between 35 and 37 weeks.  Your health care provider may ask you:  What your birth plan is.  How you are feeling.  If you are feeling the baby move.  If you have had   any abnormal symptoms, such as leaking fluid, bleeding, severe headaches, or abdominal cramping.  If you are using any tobacco products, including cigarettes, chewing tobacco, and electronic cigarettes.  If you have any questions.  Other tests or screenings that may be performed during your third trimester include:  Blood tests that check for low iron levels (anemia).  Fetal testing to check the health, activity level, and growth of the fetus. Testing is done if you have certain medical conditions or if there are problems during the  pregnancy.  Nonstress test (NST). This test checks the health of your baby to make sure there are no signs of problems, such as the baby not getting enough oxygen. During this test, a belt is placed around your belly. The baby is made to move, and its heart rate is monitored during movement.  What is false labor? False labor is a condition in which you feel small, irregular tightenings of the muscles in the womb (contractions) that usually go away with rest, changing position, or drinking water. These are called Braxton Hicks contractions. Contractions may last for hours, days, or even weeks before true labor sets in. If contractions come at regular intervals, become more frequent, increase in intensity, or become painful, you should see your health care provider. What are the signs of labor?  Abdominal cramps.  Regular contractions that start at 10 minutes apart and become stronger and more frequent with time.  Contractions that start on the top of the uterus and spread down to the lower abdomen and back.  Increased pelvic pressure and dull back pain.  A watery or bloody mucus discharge that comes from the vagina.  Leaking of amniotic fluid. This is also known as your "water breaking." It could be a slow trickle or a gush. Let your health care provider know if it has a color or strange odor. If you have any of these signs, call your health care provider right away, even if it is before your due date. Follow these instructions at home: Medicines  Follow your health care provider's instructions regarding medicine use. Specific medicines may be either safe or unsafe to take during pregnancy.  Take a prenatal vitamin that contains at least 600 micrograms (mcg) of folic acid.  If you develop constipation, try taking a stool softener if your health care provider approves. Eating and drinking  Eat a balanced diet that includes fresh fruits and vegetables, whole grains, good sources of protein  such as meat, eggs, or tofu, and low-fat dairy. Your health care provider will help you determine the amount of weight gain that is right for you.  Avoid raw meat and uncooked cheese. These carry germs that can cause birth defects in the baby.  If you have low calcium intake from food, talk to your health care provider about whether you should take a daily calcium supplement.  Eat four or five small meals rather than three large meals a day.  Limit foods that are high in fat and processed sugars, such as fried and sweet foods.  To prevent constipation: ? Drink enough fluid to keep your urine clear or pale yellow. ? Eat foods that are high in fiber, such as fresh fruits and vegetables, whole grains, and beans. Activity  Exercise only as directed by your health care provider. Most women can continue their usual exercise routine during pregnancy. Try to exercise for 30 minutes at least 5 days a week. Stop exercising if you experience uterine contractions.  Avoid heavy   lifting.  Do not exercise in extreme heat or humidity, or at high altitudes.  Wear low-heel, comfortable shoes.  Practice good posture.  You may continue to have sex unless your health care provider tells you otherwise. Relieving pain and discomfort  Take frequent breaks and rest with your legs elevated if you have leg cramps or low back pain.  Take warm sitz baths to soothe any pain or discomfort caused by hemorrhoids. Use hemorrhoid cream if your health care provider approves.  Wear a good support bra to prevent discomfort from breast tenderness.  If you develop varicose veins: ? Wear support pantyhose or compression stockings as told by your healthcare provider. ? Elevate your feet for 15 minutes, 3-4 times a day. Prenatal care  Write down your questions. Take them to your prenatal visits.  Keep all your prenatal visits as told by your health care provider. This is important. Safety  Wear your seat belt at  all times when driving.  Make a list of emergency phone numbers, including numbers for family, friends, the hospital, and police and fire departments. General instructions  Avoid cat litter boxes and soil used by cats. These carry germs that can cause birth defects in the baby. If you have a cat, ask someone to clean the litter box for you.  Do not travel far distances unless it is absolutely necessary and only with the approval of your health care provider.  Do not use hot tubs, steam rooms, or saunas.  Do not drink alcohol.  Do not use any products that contain nicotine or tobacco, such as cigarettes and e-cigarettes. If you need help quitting, ask your health care provider.  Do not use any medicinal herbs or unprescribed drugs. These chemicals affect the formation and growth of the baby.  Do not douche or use tampons or scented sanitary pads.  Do not cross your legs for long periods of time.  To prepare for the arrival of your baby: ? Take prenatal classes to understand, practice, and ask questions about labor and delivery. ? Make a trial run to the hospital. ? Visit the hospital and tour the maternity area. ? Arrange for maternity or paternity leave through employers. ? Arrange for family and friends to take care of pets while you are in the hospital. ? Purchase a rear-facing car seat and make sure you know how to install it in your car. ? Pack your hospital bag. ? Prepare the baby's nursery. Make sure to remove all pillows and stuffed animals from the baby's crib to prevent suffocation.  Visit your dentist if you have not gone during your pregnancy. Use a soft toothbrush to brush your teeth and be gentle when you floss. Contact a health care provider if:  You are unsure if you are in labor or if your water has broken.  You become dizzy.  You have mild pelvic cramps, pelvic pressure, or nagging pain in your abdominal area.  You have lower back pain.  You have persistent  nausea, vomiting, or diarrhea.  You have an unusual or bad smelling vaginal discharge.  You have pain when you urinate. Get help right away if:  Your water breaks before 37 weeks.  You have regular contractions less than 5 minutes apart before 37 weeks.  You have a fever.  You are leaking fluid from your vagina.  You have spotting or bleeding from your vagina.  You have severe abdominal pain or cramping.  You have rapid weight loss or weight gain.    You have shortness of breath with chest pain.  You notice sudden or extreme swelling of your face, hands, ankles, feet, or legs.  Your baby makes fewer than 10 movements in 2 hours.  You have severe headaches that do not go away when you take medicine.  You have vision changes. Summary  The third trimester is from week 28 through week 40, months 7 through 9. The third trimester is a time when the unborn baby (fetus) is growing rapidly.  During the third trimester, your discomfort may increase as you and your baby continue to gain weight. You may have abdominal, leg, and back pain, sleeping problems, and an increased need to urinate.  During the third trimester your breasts will keep growing and they will continue to become tender. A yellow fluid (colostrum) may leak from your breasts. This is the first milk you are producing for your baby.  False labor is a condition in which you feel small, irregular tightenings of the muscles in the womb (contractions) that eventually go away. These are called Braxton Hicks contractions. Contractions may last for hours, days, or even weeks before true labor sets in.  Signs of labor can include: abdominal cramps; regular contractions that start at 10 minutes apart and become stronger and more frequent with time; watery or bloody mucus discharge that comes from the vagina; increased pelvic pressure and dull back pain; and leaking of amniotic fluid. This information is not intended to replace advice  given to you by your health care provider. Make sure you discuss any questions you have with your health care provider. Document Released: 09/14/2001 Document Revised: 02/26/2016 Document Reviewed: 11/21/2012 Elsevier Interactive Patient Education  2017 Else[employer] Inc.  

## 2018-01-11 NOTE — Addendum Note (Signed)
Addended by: Tim LairLARK, Kei Mcelhiney on: 01/11/2018 09:49 AM   Modules accepted: Orders

## 2018-01-12 LAB — CBC
HEMATOCRIT: 27.6 % — AB (ref 34.0–46.6)
Hemoglobin: 9 g/dL — ABNORMAL LOW (ref 11.1–15.9)
MCH: 24.5 pg — ABNORMAL LOW (ref 26.6–33.0)
MCHC: 32.6 g/dL (ref 31.5–35.7)
MCV: 75 fL — AB (ref 79–97)
PLATELETS: 261 10*3/uL (ref 150–379)
RBC: 3.67 x10E6/uL — AB (ref 3.77–5.28)
RDW: 15 % (ref 12.3–15.4)
WBC: 6.9 10*3/uL (ref 3.4–10.8)

## 2018-01-12 LAB — HIV ANTIBODY (ROUTINE TESTING W REFLEX): HIV SCREEN 4TH GENERATION: NONREACTIVE

## 2018-01-12 LAB — RPR: RPR Ser Ql: NONREACTIVE

## 2018-01-13 ENCOUNTER — Other Ambulatory Visit: Payer: Self-pay

## 2018-01-13 MED ORDER — FERROUS SULFATE 325 (65 FE) MG PO TABS: 325.0000 mg | ORAL_TABLET | Freq: Two times a day (BID) | ORAL | Status: DC

## 2018-01-16 ENCOUNTER — Other Ambulatory Visit: Payer: Medicaid Other

## 2018-01-16 ENCOUNTER — Encounter: Payer: Self-pay | Admitting: *Deleted

## 2018-01-16 DIAGNOSIS — Z3480 Encounter for supervision of other normal pregnancy, unspecified trimester: Secondary | ICD-10-CM

## 2018-01-17 LAB — GLUCOSE TOLERANCE, 2 HOURS W/ 1HR
GLUCOSE, 2 HOUR: 82 mg/dL (ref 65–152)
Glucose, 1 hour: 120 mg/dL (ref 65–179)
Glucose, Fasting: 83 mg/dL (ref 65–91)

## 2018-01-18 ENCOUNTER — Encounter: Payer: Self-pay | Admitting: *Deleted

## 2018-01-18 ENCOUNTER — Ambulatory Visit (INDEPENDENT_AMBULATORY_CARE_PROVIDER_SITE_OTHER): Payer: Medicaid Other | Admitting: Obstetrics and Gynecology

## 2018-01-18 ENCOUNTER — Encounter: Payer: Self-pay | Admitting: Obstetrics and Gynecology

## 2018-01-18 VITALS — BP 128/77 | HR 108 | Wt 245.5 lb

## 2018-01-18 DIAGNOSIS — Z98891 History of uterine scar from previous surgery: Secondary | ICD-10-CM

## 2018-01-18 DIAGNOSIS — M549 Dorsalgia, unspecified: Secondary | ICD-10-CM

## 2018-01-18 DIAGNOSIS — O359XX0 Maternal care for (suspected) fetal abnormality and damage, unspecified, not applicable or unspecified: Secondary | ICD-10-CM

## 2018-01-18 DIAGNOSIS — O99891 Other specified diseases and conditions complicating pregnancy: Secondary | ICD-10-CM

## 2018-01-18 DIAGNOSIS — O9989 Other specified diseases and conditions complicating pregnancy, childbirth and the puerperium: Secondary | ICD-10-CM

## 2018-01-18 DIAGNOSIS — O09299 Supervision of pregnancy with other poor reproductive or obstetric history, unspecified trimester: Secondary | ICD-10-CM

## 2018-01-18 DIAGNOSIS — Z3480 Encounter for supervision of other normal pregnancy, unspecified trimester: Secondary | ICD-10-CM

## 2018-01-18 DIAGNOSIS — O10919 Unspecified pre-existing hypertension complicating pregnancy, unspecified trimester: Secondary | ICD-10-CM

## 2018-01-18 NOTE — Patient Instructions (Signed)
Back Pain in Pregnancy Back pain during pregnancy is common. Back pain may be caused by several factors that are related to changes during your pregnancy. Follow these instructions at home: Managing pain, stiffness, and swelling  If directed, apply ice for sudden (acute) back pain. ? Put ice in a plastic bag. ? Place a towel between your skin and the bag. ? Leave the ice on for 20 minutes, 2-3 times per day.  If directed, apply heat to the affected area before you exercise: ? Place a towel between your skin and the heat pack or heating pad. ? Leave the heat on for 20-30 minutes. ? Remove the heat if your skin turns bright red. This is especially important if you are unable to feel pain, heat, or cold. You may have a greater risk of getting burned. Activity  Exercise as told by your health care provider. Exercising is the best way to prevent or manage back pain.  Listen to your body when lifting. If lifting hurts, ask for help or bend your knees. This uses your leg muscles instead of your back muscles.  Squat down when picking up something from the floor. Do not bend over.  Only use bed rest as told by your health care provider. Bed rest should only be used for the most severe episodes of back pain. Standing, Sitting, and Lying Down  Do not stand in one place for long periods of time.  Use good posture when sitting. Make sure your head rests over your shoulders and is not hanging forward. Use a pillow on your lower back if necessary.  Try sleeping on your side, preferably the left side, with a pillow or two between your legs. If you are sore after a night's rest, your bed may be too soft. A firm mattress may provide more support for your back during pregnancy. General instructions  Do not wear high heels.  Eat a healthy diet. Try to gain weight within your health care provider's recommendations.  Use a maternity girdle, elastic sling, or back brace as told by your health care  provider.  Take over-the-counter and prescription medicines only as told by your health care provider.  Keep all follow-up visits as told by your health care provider. This is important. This includes any visits with any specialists, such as a physical therapist. Contact a health care provider if:  Your back pain interferes with your daily activities.  You have increasing pain in other parts of your body. Get help right away if:  You develop numbness, tingling, weakness, or problems with the use of your arms or legs.  You develop severe back pain that is not controlled with medicine.  You have a sudden change in bowel or bladder control.  You develop shortness of breath, dizziness, or you faint.  You develop nausea, vomiting, or sweating.  You have back pain that is a rhythmic, cramping pain similar to labor pains. Labor pain is usually 1-2 minutes apart, lasts for about 1 minute, and involves a bearing down feeling or pressure in your pelvis.  You have back pain and your water breaks or you have vaginal bleeding.  You have back pain or numbness that travels down your leg.  Your back pain developed after you fell.  You develop pain on one side of your back.  You see blood in your urine.  You develop skin blisters in the area of your back pain. This information is not intended to replace advice given to you   by your health care provider. Make sure you discuss any questions you have with your health care provider. Document Released: 12/29/2005 Document Revised: 02/26/2016 Document Reviewed: 06/04/2015 Elsevier Interactive Patient Education  2018 Elsevier Inc.  

## 2018-01-18 NOTE — Progress Notes (Signed)
Subjective:  Kristine Perez is a 27 y.o. G2P1001 at 5621w0d being seen today for ongoing prenatal care.  She is currently monitored for the following issues for this high-risk pregnancy and has GERD without esophagitis; Eye injury; Supervision of other normal pregnancy, antepartum; H/O: C-section; Current singleton pregnancy with history of congenital heart disease in prior child, antepartum; Chronic hypertension affecting pregnancy; Suspected fetal anomaly, antepartum; and Back pain complicating pregnancy on their problem list.  Patient reports increased problems with back pain. Has had to miss work d/t to pain. Has tried Flexeril and maternay support belts without success.  Contractions: Not present. Vag. Bleeding: None.  Movement: Present. Denies leaking of fluid.   The following portions of the patient's history were reviewed and updated as appropriate: allergies, current medications, past family history, past medical history, past social history, past surgical history and problem list. Problem list updated.  Objective:   Vitals:   01/18/18 1500  BP: 128/77  Pulse: (!) 108  Weight: 245 lb 8 oz (111.4 kg)    Fetal Status: Fetal Heart Rate (bpm): 152   Movement: Present     General:  Alert, oriented and cooperative. Patient is in no acute distress.  Skin: Skin is warm and dry. No rash noted.   Cardiovascular: Normal heart rate noted  Respiratory: Normal respiratory effort, no problems with respiration noted  Abdomen: Soft, gravid, appropriate for gestational age. Pain/Pressure: Present     Pelvic:  Cervical exam deferred        Extremities: Normal range of motion.  Edema: Trace  Mental Status: Normal mood and affect. Normal behavior. Normal judgment and thought content.   Urinalysis:      Assessment and Plan:  Pregnancy: G2P1001 at 2821w0d  1. Supervision of other normal pregnancy, antepartum stable  2. Suspected fetal anomaly, antepartum, single or unspecified fetus Possible  equinovarus Repeat scan ordered  3. Chronic hypertension affecting pregnancy BP stable  4. Current singleton pregnancy with history of congenital heart disease in prior child, antepartum Nl Fetal ECHO  5. H/O: C-section Desires TOLAC, papers signed  6. Back pain affecting pregnancy in third trimester Pt desires intermittent leave from work Pt instructed to drop off [employer] forms from work Pt made aware leave would count toward her total maternity leave. She verbalized understanding Will also refer to PT  Preterm labor symptoms and general obstetric precautions including but not limited to vaginal bleeding, contractions, leaking of fluid and fetal movement were reviewed in detail with the patient. Please refer to After Visit Summary for other counseling recommendations.  Return in about 1 week (around 01/25/2018) for OB visit.   Hermina StaggersErvin, Michael L, MD

## 2018-01-18 NOTE — Progress Notes (Signed)
Pt is in the office to discuss back pain, and wants to know if we can [facility] so that she can be covered for intermittent time missed, in relation to the pain.

## 2018-01-24 ENCOUNTER — Telehealth: Payer: Self-pay | Admitting: *Deleted

## 2018-01-24 NOTE — Telephone Encounter (Signed)
Spoke with pt regarding timed missed from work. Pt is getting paperwork from employer in order to have completed for intermittent leave during pregnancy. Pt has appt on Thursday and will bring paperwork. Pt states she was late to work today due to back pain. Pt made aware that time missed should be able to be covered once paperwork is complete.

## 2018-01-26 ENCOUNTER — Ambulatory Visit (INDEPENDENT_AMBULATORY_CARE_PROVIDER_SITE_OTHER): Payer: Medicaid Other | Admitting: Obstetrics and Gynecology

## 2018-01-26 VITALS — BP 121/78 | HR 93 | Wt 251.4 lb

## 2018-01-26 DIAGNOSIS — Z98891 History of uterine scar from previous surgery: Secondary | ICD-10-CM

## 2018-01-26 DIAGNOSIS — O10919 Unspecified pre-existing hypertension complicating pregnancy, unspecified trimester: Secondary | ICD-10-CM

## 2018-01-26 DIAGNOSIS — O359XX0 Maternal care for (suspected) fetal abnormality and damage, unspecified, not applicable or unspecified: Secondary | ICD-10-CM

## 2018-01-26 DIAGNOSIS — O09293 Supervision of pregnancy with other poor reproductive or obstetric history, third trimester: Secondary | ICD-10-CM

## 2018-01-26 DIAGNOSIS — O10913 Unspecified pre-existing hypertension complicating pregnancy, third trimester: Secondary | ICD-10-CM

## 2018-01-26 DIAGNOSIS — R87612 Low grade squamous intraepithelial lesion on cytologic smear of cervix (LGSIL): Secondary | ICD-10-CM

## 2018-01-26 DIAGNOSIS — O09299 Supervision of pregnancy with other poor reproductive or obstetric history, unspecified trimester: Secondary | ICD-10-CM

## 2018-01-26 DIAGNOSIS — Z3480 Encounter for supervision of other normal pregnancy, unspecified trimester: Secondary | ICD-10-CM

## 2018-01-26 NOTE — Progress Notes (Signed)
   PRENATAL VISIT NOTE  Subjective:  Kristine Perez is a 27 y.o. G2P1001 at 643w1d being seen today for ongoing prenatal care.  She is currently monitored for the following issues for this high-risk pregnancy and has GERD without esophagitis; Eye injury; Supervision of other normal pregnancy, antepartum; H/O: C-section; Current singleton pregnancy with history of congenital heart disease in prior child, antepartum; Chronic hypertension affecting pregnancy; Suspected fetal anomaly, antepartum; Back pain complicating pregnancy; and LGSIL on Pap smear of cervix on their problem list.  Patient reports backache.  Contractions: Not present. Vag. Bleeding: None.  Movement: Present. Denies leaking of fluid.   The following portions of the patient's history were reviewed and updated as appropriate: allergies, current medications, past family history, past medical history, past social history, past surgical history and problem list. Problem list updated.  Objective:   Vitals:   01/26/18 0943  BP: 121/78  Pulse: 93  Weight: 251 lb 6.4 oz (114 kg)    Fetal Status: Fetal Heart Rate (bpm): 148   Movement: Present     General:  Alert, oriented and cooperative. Patient is in no acute distress.  Skin: Skin is warm and dry. No rash noted.   Cardiovascular: Normal heart rate noted  Respiratory: Normal respiratory effort, no problems with respiration noted  Abdomen: Soft, gravid, appropriate for gestational age.  Pain/Pressure: Present     Pelvic: Cervical exam deferred        Extremities: Normal range of motion.  Edema: Trace  Mental Status: Normal mood and affect. Normal behavior. Normal judgment and thought content.   Assessment and Plan:  Pregnancy: G2P1001 at 1033w1d  1. Chronic hypertension affecting pregnancy No meds Cont baby ASA To start weekly testing 32 weeks  2. Current singleton pregnancy with history of congenital heart disease in prior child, antepartum Fetal echo normal 3/12  3.  H/O: C-section For TOLAC  4. Supervision of other normal pregnancy, antepartum  5. Suspected fetal anomaly, antepartum, single or unspecified fetus Repeat US 02/08/18  6. LGSIL on Pap smear of cervix Repeat colpo pp   Preterm labor symptoms and general obstetric precautions including but not limited to vaginal bleeding, contractions, leaking of fluid and fetal movement were reviewed in detail with the patient. Please refer to After Visit Summary for other counseling recommendations.  Return in about 2 weeks (around 02/09/2018) for OB visit (MD).  Future Appointments  Date Time Provider Department Center  01/31/2018  4:15 PM Vivien PrestoSimpson, [place], [place] [facility]  02/08/2018  3:30 PM [facility] US 1 [facility]  02/09/2018  4:00 PM Conan Bowensavis, Tempie Gibeault M, MD [facility] None    Conan BowensKelly M Harsimran Westman, MD

## 2018-01-26 NOTE — Addendum Note (Signed)
Addended by: Hamilton CapriBURCH, Armoni Kludt J on: 01/26/2018 10:56 AM   Modules accepted: Orders

## 2018-01-26 NOTE — Addendum Note (Signed)
Addended by: Burnell BlanksMASHBURN, Geronimo Diliberto on: 01/26/2018 10:58 AM   Modules accepted: Orders

## 2018-01-26 NOTE — Addendum Note (Signed)
Addended by: Hamilton CapriBURCH, ARIEL J on: 01/26/2018 11:22 AM   Modules accepted: Orders

## 2018-01-27 LAB — ABO/RH: RH TYPE: POSITIVE

## 2018-01-27 LAB — ANTIBODY SCREEN: Antibody Screen: NEGATIVE

## 2018-01-30 ENCOUNTER — Encounter (HOSPITAL_COMMUNITY): Payer: Self-pay | Admitting: *Deleted

## 2018-01-30 ENCOUNTER — Other Ambulatory Visit: Payer: Self-pay

## 2018-01-30 ENCOUNTER — Inpatient Hospital Stay (HOSPITAL_COMMUNITY)
Admission: AD | Admit: 2018-01-30 | Discharge: 2018-01-30 | Disposition: A | Discharge status: Home / Self Care | Payer: Medicaid Other | Source: Ambulatory Visit | Attending: Obstetrics and Gynecology | Admitting: Obstetrics and Gynecology

## 2018-01-30 DIAGNOSIS — Z98891 History of uterine scar from previous surgery: Secondary | ICD-10-CM

## 2018-01-30 DIAGNOSIS — Z3A29 29 weeks gestation of pregnancy: Secondary | ICD-10-CM | POA: Diagnosis not present

## 2018-01-30 DIAGNOSIS — Z7982 Long term (current) use of aspirin: Secondary | ICD-10-CM | POA: Diagnosis not present

## 2018-01-30 DIAGNOSIS — M549 Dorsalgia, unspecified: Secondary | ICD-10-CM | POA: Insufficient documentation

## 2018-01-30 DIAGNOSIS — O99891 Other specified diseases and conditions complicating pregnancy: Secondary | ICD-10-CM

## 2018-01-30 DIAGNOSIS — O10913 Unspecified pre-existing hypertension complicating pregnancy, third trimester: Secondary | ICD-10-CM

## 2018-01-30 DIAGNOSIS — K117 Disturbances of salivary secretion: Secondary | ICD-10-CM

## 2018-01-30 DIAGNOSIS — O23593 Infection of other part of genital tract in pregnancy, third trimester: Secondary | ICD-10-CM | POA: Insufficient documentation

## 2018-01-30 DIAGNOSIS — N760 Acute vaginitis: Secondary | ICD-10-CM

## 2018-01-30 DIAGNOSIS — B9689 Other specified bacterial agents as the cause of diseases classified elsewhere: Secondary | ICD-10-CM | POA: Insufficient documentation

## 2018-01-30 DIAGNOSIS — O09299 Supervision of pregnancy with other poor reproductive or obstetric history, unspecified trimester: Secondary | ICD-10-CM

## 2018-01-30 DIAGNOSIS — O9989 Other specified diseases and conditions complicating pregnancy, childbirth and the puerperium: Secondary | ICD-10-CM

## 2018-01-30 DIAGNOSIS — R87612 Low grade squamous intraepithelial lesion on cytologic smear of cervix (LGSIL): Secondary | ICD-10-CM

## 2018-01-30 DIAGNOSIS — O368130 Decreased fetal movements, third trimester, not applicable or unspecified: Secondary | ICD-10-CM

## 2018-01-30 DIAGNOSIS — O10919 Unspecified pre-existing hypertension complicating pregnancy, unspecified trimester: Secondary | ICD-10-CM

## 2018-01-30 LAB — URINALYSIS, ROUTINE W REFLEX MICROSCOPIC
Bilirubin Urine: NEGATIVE
Glucose, UA: NEGATIVE mg/dL
Hgb urine dipstick: NEGATIVE
Ketones, ur: NEGATIVE mg/dL
Nitrite: NEGATIVE
PROTEIN: 30 mg/dL — AB
SPECIFIC GRAVITY, URINE: 1.028 (ref 1.005–1.030)
pH: 7 (ref 5.0–8.0)

## 2018-01-30 LAB — WET PREP, GENITAL
Sperm: NONE SEEN
TRICH WET PREP: NONE SEEN
YEAST WET PREP: NONE SEEN

## 2018-01-30 MED ORDER — TINIDAZOLE 500 MG PO TABS: 2000.0000 mg | ORAL_TABLET | Freq: Every day | ORAL | 0 refills | Status: AC
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
## 2018-01-30 NOTE — Discharge Instructions (Signed)
Get your medicine from the pharmacy and take as directed. Keep your appointments in the office. Call the office and be seen if you are not doing well.

## 2018-01-30 NOTE — MAU Note (Signed)
Keep having like back pains, stomach keeps cramping up.  Keep having dizzy spasms  Today.  Baby hasn't moved since last night.

## 2018-01-31 ENCOUNTER — Ambulatory Visit: Payer: Medicaid Other | Attending: Obstetrics and Gynecology | Admitting: Physical Therapy

## 2018-01-31 ENCOUNTER — Encounter: Payer: Self-pay | Admitting: Physical Therapy

## 2018-01-31 ENCOUNTER — Other Ambulatory Visit: Payer: Self-pay

## 2018-01-31 DIAGNOSIS — M5441 Lumbago with sciatica, right side: Secondary | ICD-10-CM | POA: Diagnosis present

## 2018-01-31 DIAGNOSIS — M5442 Lumbago with sciatica, left side: Secondary | ICD-10-CM | POA: Insufficient documentation

## 2018-01-31 LAB — GC/CHLAMYDIA PROBE AMP (~~LOC~~) NOT AT ARMC
CHLAMYDIA, DNA PROBE: NEGATIVE
NEISSERIA GONORRHEA: NEGATIVE

## 2018-01-31 NOTE — Patient Instructions (Signed)
Change your position often;  Sit, standing, lying on side with pillow between knees;  No lying flat on your back.  Angry Cat Stretch  Tuck chin and tighten stomach, arching back. Repeat __5-10__ times per set.  Do _1-2___ sessions per day. Copyright  VHI. All rights reserved.   Try sitting on exercise ball and roll forward and back      Abduction: Clam (Eccentric) - Side-Lying   Lie on side with knees bent. Lift top knee, keeping feet together. Keep trunk steady. Slowly lower for 3-5 seconds. __10_ reps per set, __1_ sets per day, _7__ days per week.   Copyright  VHI. All rights reserved.   Lavinia Sharps PT [facility] [address], [address] [place], [place] [address] Phone # [phone] Fax [phone]
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
## 2018-02-01 LAB — CULTURE, OB URINE

## 2018-02-08 ENCOUNTER — Ambulatory Visit (HOSPITAL_COMMUNITY)
Admission: RE | Admit: 2018-02-08 | Discharge: 2018-02-08 | Disposition: A | Discharge status: Home / Self Care | Payer: Medicaid Other | Source: Ambulatory Visit | Attending: Certified Nurse Midwife | Admitting: Certified Nurse Midwife

## 2018-02-08 ENCOUNTER — Encounter (HOSPITAL_COMMUNITY): Payer: Self-pay

## 2018-02-08 DIAGNOSIS — O10919 Unspecified pre-existing hypertension complicating pregnancy, unspecified trimester: Secondary | ICD-10-CM

## 2018-02-08 DIAGNOSIS — O09299 Supervision of pregnancy with other poor reproductive or obstetric history, unspecified trimester: Secondary | ICD-10-CM

## 2018-02-08 DIAGNOSIS — Z3A31 31 weeks gestation of pregnancy: Secondary | ICD-10-CM | POA: Diagnosis not present

## 2018-02-08 DIAGNOSIS — O10013 Pre-existing essential hypertension complicating pregnancy, third trimester: Secondary | ICD-10-CM | POA: Diagnosis not present

## 2018-02-08 DIAGNOSIS — Z98891 History of uterine scar from previous surgery: Secondary | ICD-10-CM

## 2018-02-08 DIAGNOSIS — R87612 Low grade squamous intraepithelial lesion on cytologic smear of cervix (LGSIL): Secondary | ICD-10-CM

## 2018-02-09 ENCOUNTER — Encounter: Payer: Self-pay | Admitting: Obstetrics and Gynecology

## 2018-02-09 ENCOUNTER — Ambulatory Visit (INDEPENDENT_AMBULATORY_CARE_PROVIDER_SITE_OTHER): Payer: Medicaid Other | Admitting: Obstetrics and Gynecology

## 2018-02-09 VITALS — BP 115/74 | HR 94 | Wt 246.0 lb

## 2018-02-09 DIAGNOSIS — O359XX0 Maternal care for (suspected) fetal abnormality and damage, unspecified, not applicable or unspecified: Secondary | ICD-10-CM

## 2018-02-09 DIAGNOSIS — Z3480 Encounter for supervision of other normal pregnancy, unspecified trimester: Secondary | ICD-10-CM

## 2018-02-09 DIAGNOSIS — O10919 Unspecified pre-existing hypertension complicating pregnancy, unspecified trimester: Secondary | ICD-10-CM

## 2018-02-09 DIAGNOSIS — O09299 Supervision of pregnancy with other poor reproductive or obstetric history, unspecified trimester: Secondary | ICD-10-CM

## 2018-02-09 DIAGNOSIS — R87612 Low grade squamous intraepithelial lesion on cytologic smear of cervix (LGSIL): Secondary | ICD-10-CM

## 2018-02-09 DIAGNOSIS — O09293 Supervision of pregnancy with other poor reproductive or obstetric history, third trimester: Secondary | ICD-10-CM

## 2018-02-09 DIAGNOSIS — Z98891 History of uterine scar from previous surgery: Secondary | ICD-10-CM

## 2018-02-09 DIAGNOSIS — O10913 Unspecified pre-existing hypertension complicating pregnancy, third trimester: Secondary | ICD-10-CM

## 2018-02-09 NOTE — Progress Notes (Signed)
   PRENATAL VISIT NOTE  Subjective:  Kristine Perez is a 27 y.o. G2P1001 at [redacted]w[redacted]d being seen today for ongoing prenatal care.  She is currently monitored for the following issues for this high-risk pregnancy and has GERD without esophagitis; Eye injury; Supervision of other normal pregnancy, antepartum; H/O: C-section; Current singleton pregnancy with history of congenital heart disease in prior child, antepartum; Chronic hypertension affecting pregnancy; Suspected fetal anomaly, antepartum; Back pain complicating pregnancy; LGSIL on Pap smear of cervix; and Ptyalism on their problem list.  Patient reports backache.  Contractions: Not present. Vag. Bleeding: None.  Movement: Present. Denies leaking of fluid.   The following portions of the patient's history were reviewed and updated as appropriate: allergies, current medications, past family history, past medical history, past social history, past surgical history and problem list. Problem list updated.  Objective:   Vitals:   02/09/18 1626  BP: 115/74  Pulse: 94  Weight: 246 lb (111.6 kg)    Fetal Status: Fetal Heart Rate (bpm): 140   Movement: Present     General:  Alert, oriented and cooperative. Patient is in no acute distress.  Skin: Skin is warm and dry. No rash noted.   Cardiovascular: Normal heart rate noted  Respiratory: Normal respiratory effort, no problems with respiration noted  Abdomen: Soft, gravid, appropriate for gestational age.  Pain/Pressure: Present     Pelvic: Cervical exam deferred        Extremities: Normal range of motion.  Edema: Trace  Mental Status: Normal mood and affect. Normal behavior. Normal judgment and thought content.   Assessment and Plan:  Pregnancy: G2P1001 at [redacted]w[redacted]d  1. Supervision of other normal pregnancy, antepartum  2. Chronic hypertension affecting pregnancy Last growth 47th%tile BP stable Cont baby ASA  3. Current singleton pregnancy with history of congenital heart disease in  prior child, antepartum Fetal echo normal  4. H/O: C-section For TOLAC  5. Suspected fetal anomaly, antepartum, single or unspecified fetus  6. LGSIL on Pap smear of cervix colpo PP  Preterm labor symptoms and general obstetric precautions including but not limited to vaginal bleeding, contractions, leaking of fluid and fetal movement were reviewed in detail with the patient. Please refer to After Visit Summary for other counseling recommendations.  Return in about 1 week (around 02/16/2018) for OB visit (MD).  Future Appointments  Date Time Provider Department Center  02/14/2018  2:15 PM Allie Bossier, MD [facility] None  02/14/2018  2:30 PM [facility] ULTRASOUND [facility] None  02/17/2018 10:30 AM [facility] None  02/21/2018  2:15 PM Hermina Staggers, MD [facility] None  02/21/2018  2:30 PM [facility] ULTRAS[facility] None  02/24/2018 10:30 AM [facility] None  02/28/2018  2:30 PM Constant, Peggy, MD [facility] None  02/28/2018  2:45 PM [facility] ULTRASOUND [facility] None  03/03/2018 10:30 AM [facility] None  03/13/2018  3:15 PM [facility] [place] 4 [facility]    Conan Bowens, MD

## 2018-02-14 ENCOUNTER — Ambulatory Visit (INDEPENDENT_AMBULATORY_CARE_PROVIDER_SITE_OTHER): Payer: Medicaid Other | Admitting: Obstetrics & Gynecology

## 2018-02-14 ENCOUNTER — Other Ambulatory Visit: Payer: Medicaid Other

## 2018-02-14 VITALS — BP 125/77 | HR 101 | Wt 243.9 lb

## 2018-02-14 DIAGNOSIS — O359XX0 Maternal care for (suspected) fetal abnormality and damage, unspecified, not applicable or unspecified: Secondary | ICD-10-CM

## 2018-02-14 DIAGNOSIS — O10919 Unspecified pre-existing hypertension complicating pregnancy, unspecified trimester: Secondary | ICD-10-CM

## 2018-02-14 DIAGNOSIS — O10913 Unspecified pre-existing hypertension complicating pregnancy, third trimester: Secondary | ICD-10-CM

## 2018-02-14 DIAGNOSIS — Z3480 Encounter for supervision of other normal pregnancy, unspecified trimester: Secondary | ICD-10-CM

## 2018-02-14 NOTE — Progress Notes (Signed)
Patient reports good fetal movement with occasional pressure. 

## 2018-02-14 NOTE — Progress Notes (Signed)
   PRENATAL VISIT NOTE  Subjective:  Kristine Perez is a 27 y.o. G2P1001 at [redacted]w[redacted]d being seen today for ongoing prenatal care.  She is currently monitored for the following issues for this high-risk pregnancy and has GERD without esophagitis; Eye injury; Supervision of other normal pregnancy, antepartum; H/O: C-section; Current singleton pregnancy with history of congenital heart disease in prior child, antepartum; Chronic hypertension affecting pregnancy; Suspected fetal anomaly, antepartum; Back pain complicating pregnancy; LGSIL on Pap smear of cervix; and Ptyalism on their problem list.  Patient reports sciatica. She is seeing PT for this issue.  Contractions: Not present. Vag. Bleeding: None.  Movement: Present. Denies leaking of fluid.   The following portions of the patient's history were reviewed and updated as appropriate: allergies, current medications, past family history, past medical history, past social history, past surgical history and problem list. Problem list updated.  Objective:   Vitals:   02/14/18 1429  BP: 125/77  Pulse: (!) 101  Weight: 243 lb 14.4 oz (110.6 kg)    Fetal Status: Fetal Heart Rate (bpm): NST   Movement: Present     General:  Alert, oriented and cooperative. Patient is in no acute distress.  Skin: Skin is warm and dry. No rash noted.   Cardiovascular: Normal heart rate noted  Respiratory: Normal respiratory effort, no problems with respiration noted  Abdomen: Soft, gravid, appropriate for gestational age.  Pain/Pressure: Present     Pelvic: Cervical exam deferred        Extremities: Normal range of motion.  Edema: Trace  Mental Status: Normal mood and affect. Normal behavior. Normal judgment and thought content.   Assessment and Plan:  Pregnancy: G2P1001 at [redacted]w[redacted]d  1. Supervision of other normal pregnancy, antepartum   2. Suspected fetal anomaly, antepartum, single or unspecified fetus MFM following  3. Chronic hypertension affecting  pregnancy - I am not convinced that she has CHTN. I have sent Dr. Sherrie George a message to have her clarify this diagnosis. - Fetal nonstress test - US OB Limited; Future  Preterm labor symptoms and general obstetric precautions including but not limited to vaginal bleeding, contractions, leaking of fluid and fetal movement were reviewed in detail with the patient. Please refer to After Visit Summary for other counseling recommendations.  Return for for BP check.  Future Appointments  Date Time Provider Department Center  02/17/2018 10:30 AM [facility] None  02/21/2018  2:15 PM Hermina Staggers, MD [facility] None  02/21/2018  2:30 PM [facility]TRAS[facility] None  02/24/2018 10:30 AM [facility] None  02/28/2018  2:30 PM Constant, Peggy, MD [facility] None  02/28/2018  2:45 PM [facility] ULTRAS[facility] None  03/03/2018 10:30 AM [facility] None  03/13/2018  3:15 PM [facility] [place] 4 [facility]    Allie Bossier, MD

## 2018-02-17 ENCOUNTER — Ambulatory Visit (INDEPENDENT_AMBULATORY_CARE_PROVIDER_SITE_OTHER): Payer: Medicaid Other

## 2018-02-17 DIAGNOSIS — O10913 Unspecified pre-existing hypertension complicating pregnancy, third trimester: Secondary | ICD-10-CM | POA: Diagnosis not present

## 2018-02-17 DIAGNOSIS — O10919 Unspecified pre-existing hypertension complicating pregnancy, unspecified trimester: Secondary | ICD-10-CM

## 2018-02-17 NOTE — Progress Notes (Signed)
Chart reviewed for nurse visit. Agree with plan of care.   NST reviewed. Baseline 140 bpm, moderate variability, 15x15 accels, no decels  Rolm Bookbinder, [place] 02/17/2018 12:54 PM

## 2018-02-17 NOTE — Progress Notes (Signed)
Nurse visit for NST only dx: CHTN

## 2018-02-21 ENCOUNTER — Encounter: Payer: Self-pay | Admitting: Obstetrics and Gynecology

## 2018-02-21 ENCOUNTER — Ambulatory Visit (INDEPENDENT_AMBULATORY_CARE_PROVIDER_SITE_OTHER): Payer: Medicaid Other | Admitting: Obstetrics and Gynecology

## 2018-02-21 ENCOUNTER — Other Ambulatory Visit: Payer: Medicaid Other

## 2018-02-21 VITALS — BP 118/82 | HR 84 | Wt 245.8 lb

## 2018-02-21 DIAGNOSIS — O10913 Unspecified pre-existing hypertension complicating pregnancy, third trimester: Secondary | ICD-10-CM | POA: Diagnosis not present

## 2018-02-21 DIAGNOSIS — O10919 Unspecified pre-existing hypertension complicating pregnancy, unspecified trimester: Secondary | ICD-10-CM

## 2018-02-21 DIAGNOSIS — R87612 Low grade squamous intraepithelial lesion on cytologic smear of cervix (LGSIL): Secondary | ICD-10-CM

## 2018-02-21 DIAGNOSIS — O09293 Supervision of pregnancy with other poor reproductive or obstetric history, third trimester: Secondary | ICD-10-CM

## 2018-02-21 DIAGNOSIS — Z98891 History of uterine scar from previous surgery: Secondary | ICD-10-CM

## 2018-02-21 DIAGNOSIS — Z3480 Encounter for supervision of other normal pregnancy, unspecified trimester: Secondary | ICD-10-CM

## 2018-02-21 DIAGNOSIS — O34219 Maternal care for unspecified type scar from previous cesarean delivery: Secondary | ICD-10-CM

## 2018-02-21 DIAGNOSIS — O09299 Supervision of pregnancy with other poor reproductive or obstetric history, unspecified trimester: Secondary | ICD-10-CM

## 2018-02-21 NOTE — Patient Instructions (Signed)
Third Trimester of Pregnancy The third trimester is from week 28 through week 40 (months 7 through 9). The third trimester is a time when the unborn baby (fetus) is growing rapidly. At the end of the ninth month, the fetus is about 20 inches in length and weighs 6-10 pounds. Body changes during your third trimester Your body will continue to go through many changes during pregnancy. The changes vary from woman to woman. During the third trimester:  Your weight will continue to increase. You can expect to gain 25-35 pounds (11-16 kg) by the end of the pregnancy.  You may begin to get stretch marks on your hips, abdomen, and breasts.  You may urinate more often because the fetus is moving lower into your pelvis and pressing on your bladder.  You may develop or continue to have heartburn. This is caused by increased hormones that slow down muscles in the digestive tract.  You may develop or continue to have constipation because increased hormones slow digestion and cause the muscles that push waste through your intestines to relax.  You may develop hemorrhoids. These are swollen veins (varicose veins) in the rectum that can itch or be painful.  You may develop swollen, bulging veins (varicose veins) in your legs.  You may have increased body aches in the pelvis, back, or thighs. This is due to weight gain and increased hormones that are relaxing your joints.  You may have changes in your hair. These can include thickening of your hair, rapid growth, and changes in texture. Some women also have hair loss during or after pregnancy, or hair that feels dry or thin. Your hair will most likely return to normal after your baby is born.  Your breasts will continue to grow and they will continue to become tender. A yellow fluid (colostrum) may leak from your breasts. This is the first milk you are producing for your baby.  Your belly button may stick out.  You may notice more swelling in your hands,  face, or ankles.  You may have increased tingling or numbness in your hands, arms, and legs. The skin on your belly may also feel numb.  You may feel short of breath because of your expanding uterus.  You may have more problems sleeping. This can be caused by the size of your belly, increased need to urinate, and an increase in your body's metabolism.  You may notice the fetus "dropping," or moving lower in your abdomen (lightening).  You may have increased vaginal discharge.  You may notice your joints feel loose and you may have pain around your pelvic bone.  What to expect at prenatal visits You will have prenatal exams every 2 weeks until week 36. Then you will have weekly prenatal exams. During a routine prenatal visit:  You will be weighed to make sure you and the baby are growing normally.  Your blood pressure will be taken.  Your abdomen will be measured to track your baby's growth.  The fetal heartbeat will be listened to.  Any test results from the previous visit will be discussed.  You may have a cervical check near your due date to see if your cervix has softened or thinned (effaced).  You will be tested for Group B streptococcus. This happens between 35 and 37 weeks.  Your health care provider may ask you:  What your birth plan is.  How you are feeling.  If you are feeling the baby move.  If you have had   any abnormal symptoms, such as leaking fluid, bleeding, severe headaches, or abdominal cramping.  If you are using any tobacco products, including cigarettes, chewing tobacco, and electronic cigarettes.  If you have any questions.  Other tests or screenings that may be performed during your third trimester include:  Blood tests that check for low iron levels (anemia).  Fetal testing to check the health, activity level, and growth of the fetus. Testing is done if you have certain medical conditions or if there are problems during the  pregnancy.  Nonstress test (NST). This test checks the health of your baby to make sure there are no signs of problems, such as the baby not getting enough oxygen. During this test, a belt is placed around your belly. The baby is made to move, and its heart rate is monitored during movement.  What is false labor? False labor is a condition in which you feel small, irregular tightenings of the muscles in the womb (contractions) that usually go away with rest, changing position, or drinking water. These are called Braxton Hicks contractions. Contractions may last for hours, days, or even weeks before true labor sets in. If contractions come at regular intervals, become more frequent, increase in intensity, or become painful, you should see your health care provider. What are the signs of labor?  Abdominal cramps.  Regular contractions that start at 10 minutes apart and become stronger and more frequent with time.  Contractions that start on the top of the uterus and spread down to the lower abdomen and back.  Increased pelvic pressure and dull back pain.  A watery or bloody mucus discharge that comes from the vagina.  Leaking of amniotic fluid. This is also known as your "water breaking." It could be a slow trickle or a gush. Let your health care provider know if it has a color or strange odor. If you have any of these signs, call your health care provider right away, even if it is before your due date. Follow these instructions at home: Medicines  Follow your health care provider's instructions regarding medicine use. Specific medicines may be either safe or unsafe to take during pregnancy.  Take a prenatal vitamin that contains at least 600 micrograms (mcg) of folic acid.  If you develop constipation, try taking a stool softener if your health care provider approves. Eating and drinking  Eat a balanced diet that includes fresh fruits and vegetables, whole grains, good sources of protein  such as meat, eggs, or tofu, and low-fat dairy. Your health care provider will help you determine the amount of weight gain that is right for you.  Avoid raw meat and uncooked cheese. These carry germs that can cause birth defects in the baby.  If you have low calcium intake from food, talk to your health care provider about whether you should take a daily calcium supplement.  Eat four or five small meals rather than three large meals a day.  Limit foods that are high in fat and processed sugars, such as fried and sweet foods.  To prevent constipation: ? Drink enough fluid to keep your urine clear or pale yellow. ? Eat foods that are high in fiber, such as fresh fruits and vegetables, whole grains, and beans. Activity  Exercise only as directed by your health care provider. Most women can continue their usual exercise routine during pregnancy. Try to exercise for 30 minutes at least 5 days a week. Stop exercising if you experience uterine contractions.  Avoid heavy   lifting.  Do not exercise in extreme heat or humidity, or at high altitudes.  Wear low-heel, comfortable shoes.  Practice good posture.  You may continue to have sex unless your health care provider tells you otherwise. Relieving pain and discomfort  Take frequent breaks and rest with your legs elevated if you have leg cramps or low back pain.  Take warm sitz baths to soothe any pain or discomfort caused by hemorrhoids. Use hemorrhoid cream if your health care provider approves.  Wear a good support bra to prevent discomfort from breast tenderness.  If you develop varicose veins: ? Wear support pantyhose or compression stockings as told by your healthcare provider. ? Elevate your feet for 15 minutes, 3-4 times a day. Prenatal care  Write down your questions. Take them to your prenatal visits.  Keep all your prenatal visits as told by your health care provider. This is important. Safety  Wear your seat belt at  all times when driving.  Make a list of emergency phone numbers, including numbers for family, friends, the hospital, and police and fire departments. General instructions  Avoid cat litter boxes and soil used by cats. These carry germs that can cause birth defects in the baby. If you have a cat, ask someone to clean the litter box for you.  Do not travel far distances unless it is absolutely necessary and only with the approval of your health care provider.  Do not use hot tubs, steam rooms, or saunas.  Do not drink alcohol.  Do not use any products that contain nicotine or tobacco, such as cigarettes and e-cigarettes. If you need help quitting, ask your health care provider.  Do not use any medicinal herbs or unprescribed drugs. These chemicals affect the formation and growth of the baby.  Do not douche or use tampons or scented sanitary pads.  Do not cross your legs for long periods of time.  To prepare for the arrival of your baby: ? Take prenatal classes to understand, practice, and ask questions about labor and delivery. ? Make a trial run to the hospital. ? Visit the hospital and tour the maternity area. ? Arrange for maternity or paternity leave through employers. ? Arrange for family and friends to take care of pets while you are in the hospital. ? Purchase a rear-facing car seat and make sure you know how to install it in your car. ? Pack your hospital bag. ? Prepare the baby's nursery. Make sure to remove all pillows and stuffed animals from the baby's crib to prevent suffocation.  Visit your dentist if you have not gone during your pregnancy. Use a soft toothbrush to brush your teeth and be gentle when you floss. Contact a health care provider if:  You are unsure if you are in labor or if your water has broken.  You become dizzy.  You have mild pelvic cramps, pelvic pressure, or nagging pain in your abdominal area.  You have lower back pain.  You have persistent  nausea, vomiting, or diarrhea.  You have an unusual or bad smelling vaginal discharge.  You have pain when you urinate. Get help right away if:  Your water breaks before 37 weeks.  You have regular contractions less than 5 minutes apart before 37 weeks.  You have a fever.  You are leaking fluid from your vagina.  You have spotting or bleeding from your vagina.  You have severe abdominal pain or cramping.  You have rapid weight loss or weight gain.    You have shortness of breath with chest pain.  You notice sudden or extreme swelling of your face, hands, ankles, feet, or legs.  Your baby makes fewer than 10 movements in 2 hours.  You have severe headaches that do not go away when you take medicine.  You have vision changes. Summary  The third trimester is from week 28 through week 40, months 7 through 9. The third trimester is a time when the unborn baby (fetus) is growing rapidly.  During the third trimester, your discomfort may increase as you and your baby continue to gain weight. You may have abdominal, leg, and back pain, sleeping problems, and an increased need to urinate.  During the third trimester your breasts will keep growing and they will continue to become tender. A yellow fluid (colostrum) may leak from your breasts. This is the first milk you are producing for your baby.  False labor is a condition in which you feel small, irregular tightenings of the muscles in the womb (contractions) that eventually go away. These are called Braxton Hicks contractions. Contractions may last for hours, days, or even weeks before true labor sets in.  Signs of labor can include: abdominal cramps; regular contractions that start at 10 minutes apart and become stronger and more frequent with time; watery or bloody mucus discharge that comes from the vagina; increased pelvic pressure and dull back pain; and leaking of amniotic fluid. This information is not intended to replace advice  given to you by your health care provider. Make sure you discuss any questions you have with your health care provider. Document Released: 09/14/2001 Document Revised: 02/26/2016 Document Reviewed: 11/21/2012 Elsevier Interactive Patient Education  2017 Else[employer] Inc.  
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
## 2018-02-24 ENCOUNTER — Ambulatory Visit (INDEPENDENT_AMBULATORY_CARE_PROVIDER_SITE_OTHER): Payer: Medicaid Other

## 2018-02-24 VITALS — BP 123/70 | HR 101 | Wt 245.0 lb

## 2018-02-24 DIAGNOSIS — O10913 Unspecified pre-existing hypertension complicating pregnancy, third trimester: Secondary | ICD-10-CM

## 2018-02-24 DIAGNOSIS — O10919 Unspecified pre-existing hypertension complicating pregnancy, unspecified trimester: Secondary | ICD-10-CM

## 2018-02-24 NOTE — Progress Notes (Signed)
Presents for NST. 

## 2018-02-28 ENCOUNTER — Ambulatory Visit (INDEPENDENT_AMBULATORY_CARE_PROVIDER_SITE_OTHER): Payer: Medicaid Other | Admitting: Obstetrics and Gynecology

## 2018-02-28 ENCOUNTER — Other Ambulatory Visit: Payer: Medicaid Other

## 2018-02-28 ENCOUNTER — Encounter: Payer: Self-pay | Admitting: Obstetrics and Gynecology

## 2018-02-28 VITALS — BP 113/71 | HR 92 | Wt 245.0 lb

## 2018-02-28 DIAGNOSIS — Z3480 Encounter for supervision of other normal pregnancy, unspecified trimester: Secondary | ICD-10-CM

## 2018-02-28 DIAGNOSIS — Z98891 History of uterine scar from previous surgery: Secondary | ICD-10-CM

## 2018-02-28 DIAGNOSIS — O10919 Unspecified pre-existing hypertension complicating pregnancy, unspecified trimester: Secondary | ICD-10-CM

## 2018-02-28 DIAGNOSIS — O34219 Maternal care for unspecified type scar from previous cesarean delivery: Secondary | ICD-10-CM

## 2018-02-28 DIAGNOSIS — O10913 Unspecified pre-existing hypertension complicating pregnancy, third trimester: Secondary | ICD-10-CM

## 2018-02-28 NOTE — Progress Notes (Signed)
   PRENATAL VISIT NOTE  Subjective:  Kristine Perez is a 27 y.o. G2P1001 at [redacted]w[redacted]d being seen today for ongoing prenatal care.  She is currently monitored for the following issues for this high-risk pregnancy and has GERD without esophagitis; Eye injury; Supervision of other normal pregnancy, antepartum; H/O: C-section; Current singleton pregnancy with history of congenital heart disease in prior child, antepartum; Chronic hypertension affecting pregnancy; Suspected fetal anomaly, antepartum; Back pain complicating pregnancy; LGSIL on Pap smear of cervix; and Ptyalism on their problem list.  Patient reports no complaints.  Contractions: Not present. Vag. Bleeding: None.  Movement: Present. Denies leaking of fluid.   The following portions of the patient's history were reviewed and updated as appropriate: allergies, current medications, past family history, past medical history, past social history, past surgical history and problem list. Problem list updated.  Objective:   Vitals:   02/28/18 1446  BP: 113/71  Pulse: 92  Weight: 245 lb (111.1 kg)    Fetal Status: Fetal Heart Rate (bpm): NST   Movement: Present     General:  Alert, oriented and cooperative. Patient is in no acute distress.  Skin: Skin is warm and dry. No rash noted.   Cardiovascular: Normal heart rate noted  Respiratory: Normal respiratory effort, no problems with respiration noted  Abdomen: Soft, gravid, appropriate for gestational age.  Pain/Pressure: Present     Pelvic: Cervical exam deferred        Extremities: Normal range of motion.  Edema: Trace  Mental Status: Normal mood and affect. Normal behavior. Normal judgment and thought content.   Assessment and Plan:  Pregnancy: G2P1001 at [redacted]w[redacted]d  1. Supervision of other normal pregnancy, antepartum Patient is doing well without complaints  2. Chronic hypertension affecting pregnancy Normotensive without medication Continue ASA Follow up June growth  ultrasound NST reviewed and reactive with baseline 145, mod variability, +accels, no decels Continue antenatal testing  3. H/O: C-section Desires TOLAC Undecided on contraception- no BTL  Preterm labor symptoms and general obstetric precautions including but not limited to vaginal bleeding, contractions, leaking of fluid and fetal movement were reviewed in detail with the patient. Please refer to After Visit Summary for other counseling recommendations.  No follow-ups on file.  Future Appointments  Date Time Provider Department Center  03/03/2018 10:30 AM [facility] None  03/13/2018  3:15 PM [facility] [place] 4 [facility]    Catalina Antigua, MD

## 2018-03-03 ENCOUNTER — Ambulatory Visit: Payer: Medicaid Other

## 2018-03-03 DIAGNOSIS — Z3480 Encounter for supervision of other normal pregnancy, unspecified trimester: Secondary | ICD-10-CM

## 2018-03-03 NOTE — Progress Notes (Addendum)
I reviewed the NST and agree with the nursing assessment.  EFM: Baseline: 150 bpm, Variability: Good {> 6 bpm), Accelerations: Reactive and Decelerations: Absent Toco: Occasional UC  Brock Bad, MD 03/03/2018 11:44 AM    Patient is in the office for NST only.

## 2018-03-07 ENCOUNTER — Other Ambulatory Visit: Payer: Medicaid Other

## 2018-03-07 ENCOUNTER — Inpatient Hospital Stay (HOSPITAL_COMMUNITY): Payer: Medicaid Other

## 2018-03-07 ENCOUNTER — Encounter (HOSPITAL_COMMUNITY): Payer: Self-pay

## 2018-03-07 ENCOUNTER — Other Ambulatory Visit: Payer: Self-pay

## 2018-03-07 ENCOUNTER — Inpatient Hospital Stay (HOSPITAL_COMMUNITY)
Admission: AD | Admit: 2018-03-07 | Discharge: 2018-03-07 | Disposition: A | Discharge status: Home / Self Care | Payer: Medicaid Other | Source: Ambulatory Visit | Attending: Obstetrics & Gynecology | Admitting: Obstetrics & Gynecology

## 2018-03-07 ENCOUNTER — Ambulatory Visit (INDEPENDENT_AMBULATORY_CARE_PROVIDER_SITE_OTHER): Payer: Medicaid Other | Admitting: Obstetrics and Gynecology

## 2018-03-07 VITALS — BP 123/80 | HR 102 | Wt 242.5 lb

## 2018-03-07 DIAGNOSIS — Z91030 Bee allergy status: Secondary | ICD-10-CM | POA: Insufficient documentation

## 2018-03-07 DIAGNOSIS — O10919 Unspecified pre-existing hypertension complicating pregnancy, unspecified trimester: Secondary | ICD-10-CM

## 2018-03-07 DIAGNOSIS — Z79899 Other long term (current) drug therapy: Secondary | ICD-10-CM | POA: Diagnosis not present

## 2018-03-07 DIAGNOSIS — G43909 Migraine, unspecified, not intractable, without status migrainosus: Secondary | ICD-10-CM | POA: Diagnosis not present

## 2018-03-07 DIAGNOSIS — O10913 Unspecified pre-existing hypertension complicating pregnancy, third trimester: Secondary | ICD-10-CM

## 2018-03-07 DIAGNOSIS — Z8249 Family history of ischemic heart disease and other diseases of the circulatory system: Secondary | ICD-10-CM | POA: Diagnosis not present

## 2018-03-07 DIAGNOSIS — Z809 Family history of malignant neoplasm, unspecified: Secondary | ICD-10-CM | POA: Insufficient documentation

## 2018-03-07 DIAGNOSIS — Z9889 Other specified postprocedural states: Secondary | ICD-10-CM | POA: Diagnosis not present

## 2018-03-07 DIAGNOSIS — Z7982 Long term (current) use of aspirin: Secondary | ICD-10-CM | POA: Diagnosis not present

## 2018-03-07 DIAGNOSIS — Z3480 Encounter for supervision of other normal pregnancy, unspecified trimester: Secondary | ICD-10-CM

## 2018-03-07 DIAGNOSIS — Z833 Family history of diabetes mellitus: Secondary | ICD-10-CM | POA: Diagnosis not present

## 2018-03-07 DIAGNOSIS — O99213 Obesity complicating pregnancy, third trimester: Secondary | ICD-10-CM | POA: Diagnosis not present

## 2018-03-07 DIAGNOSIS — O09293 Supervision of pregnancy with other poor reproductive or obstetric history, third trimester: Secondary | ICD-10-CM

## 2018-03-07 DIAGNOSIS — Z98891 History of uterine scar from previous surgery: Secondary | ICD-10-CM

## 2018-03-07 DIAGNOSIS — O163 Unspecified maternal hypertension, third trimester: Secondary | ICD-10-CM | POA: Insufficient documentation

## 2018-03-07 DIAGNOSIS — Z3A34 34 weeks gestation of pregnancy: Secondary | ICD-10-CM | POA: Diagnosis not present

## 2018-03-07 DIAGNOSIS — O34219 Maternal care for unspecified type scar from previous cesarean delivery: Secondary | ICD-10-CM

## 2018-03-07 DIAGNOSIS — O09299 Supervision of pregnancy with other poor reproductive or obstetric history, unspecified trimester: Secondary | ICD-10-CM

## 2018-03-07 DIAGNOSIS — Z818 Family history of other mental and behavioral disorders: Secondary | ICD-10-CM | POA: Diagnosis not present

## 2018-03-07 DIAGNOSIS — R87612 Low grade squamous intraepithelial lesion on cytologic smear of cervix (LGSIL): Secondary | ICD-10-CM

## 2018-03-07 DIAGNOSIS — O288 Other abnormal findings on antenatal screening of mother: Secondary | ICD-10-CM

## 2018-03-07 MED ORDER — ACETAMINOPHEN 500 MG PO TABS
1000.0000 mg | ORAL_TABLET | Freq: Once | ORAL | Status: AC
  Administered 2018-03-07: 1000 mg via ORAL
  Filled 2018-03-07: qty 2

## 2018-03-07 NOTE — MAU Note (Signed)
Had been at the dr since 1430, baby "wouldn't sit stil" so they were having trouble tracing it. Sent over for a BPP and further monitoring.
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
## 2018-03-07 NOTE — MAU Note (Signed)
Urine in lab 

## 2018-03-07 NOTE — Discharge Instructions (Signed)

## 2018-03-07 NOTE — Progress Notes (Signed)
   PRENATAL VISIT NOTE  Subjective:  Kristine Perez is a 27 y.o. G2P1001 at 6266w6d being seen today for ongoing prenatal care.  She is currently monitored for the following issues for this high-risk pregnancy and has GERD without esophagitis; Eye injury; Supervision of other normal pregnancy, antepartum; H/O: C-section; Current singleton pregnancy with history of congenital heart disease in prior child, antepartum; Chronic hypertension affecting pregnancy; Suspected fetal anomaly, antepartum; Back pain complicating pregnancy; LGSIL on Pap smear of cervix; and Ptyalism on their problem list.  Patient reports no complaints.  Contractions: Irregular. Vag. Bleeding: None.  Movement: Present. Denies leaking of fluid.   The following portions of the patient's history were reviewed and updated as appropriate: allergies, current medications, past family history, past medical history, past social history, past surgical history and problem list. Problem list updated.  Objective:   Vitals:   03/07/18 1507  BP: 123/80  Pulse: (!) 102  Weight: 242 lb 8 oz (110 kg)    Fetal Status: Fetal Heart Rate (bpm): NST/AFI   Movement: Present     General:  Alert, oriented and cooperative. Patient is in no acute distress.  Skin: Skin is warm and dry. No rash noted.   Cardiovascular: Normal heart rate noted  Respiratory: Normal respiratory effort, no problems with respiration noted  Abdomen: Soft, gravid, appropriate for gestational age.  Pain/Pressure: Present     Pelvic: Cervical exam deferred        Extremities: Normal range of motion.  Edema: Trace  Mental Status: Normal mood and affect. Normal behavior. Normal judgment and thought content.   Assessment and Plan:  Pregnancy: G2P1001 at 6766w6d  1. Chronic hypertension affecting pregnancy Stable no meds Cont baby ASA NST in office today, with appearance of change in baseline, however, I believethe HR measuring in the 120s is maternal and FHR is in 150s  however on US, it appears FHR drops abruptly to 110s NST indeterminate - to MAU for BPP Next growth 03/13/18  2. Current singleton pregnancy with history of congenital heart disease in prior child, antepartum Normal fetal echo 12/2017  3. H/O: C-section Plan for TOLAC  4. LGSIL on Pap smear of cervix  5. Supervision of other normal pregnancy, antepartum Undecided on contraception  To MAU for BPP   Preterm labor symptoms and general obstetric precautions including but not limited to vaginal bleeding, contractions, leaking of fluid and fetal movement were reviewed in detail with the patient. Please refer to After Visit Summary for other counseling recommendations.  No follow-ups on file.  Future Appointments  Date Time Provider Department Center  03/10/2018 10:00 AM [facility] NURSE [facility] None  03/13/2018  3:15 PM [facility] US 4 [facility]    Conan BowensKelly M Ramil Edgington, MD

## 2018-03-09 ENCOUNTER — Encounter: Payer: Self-pay | Admitting: *Deleted

## 2018-03-09 DIAGNOSIS — Z3482 Encounter for supervision of other normal pregnancy, second trimester: Secondary | ICD-10-CM

## 2018-03-10 ENCOUNTER — Other Ambulatory Visit: Payer: Medicaid Other

## 2018-03-10 ENCOUNTER — Telehealth: Payer: Self-pay
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
## 2018-03-13 ENCOUNTER — Other Ambulatory Visit: Payer: Self-pay | Admitting: Obstetrics and Gynecology

## 2018-03-13 ENCOUNTER — Ambulatory Visit (HOSPITAL_COMMUNITY)
Admission: RE | Admit: 2018-03-13 | Discharge: 2018-03-13 | Disposition: A | Discharge status: Home / Self Care | Payer: Medicaid Other | Source: Ambulatory Visit | Attending: Obstetrics and Gynecology | Admitting: Obstetrics and Gynecology

## 2018-03-13 DIAGNOSIS — O10013 Pre-existing essential hypertension complicating pregnancy, third trimester: Secondary | ICD-10-CM | POA: Diagnosis present

## 2018-03-13 DIAGNOSIS — Z3A35 35 weeks gestation of pregnancy: Secondary | ICD-10-CM | POA: Insufficient documentation

## 2018-03-13 DIAGNOSIS — O10919 Unspecified pre-existing hypertension complicating pregnancy, unspecified trimester: Secondary | ICD-10-CM

## 2018-03-14 ENCOUNTER — Telehealth: Payer: Self-pay | Admitting: *Deleted

## 2018-03-14 NOTE — Telephone Encounter (Signed)
Spoke with [employer] regarding pt claim. Claim specialist made aware that there are no notes in pt chart discussing work or need to be out. Nothing is noted in last visit. Claim specialist will follow up with office as needed for any additional information.

## 2018-03-15 ENCOUNTER — Encounter: Payer: Self-pay | Admitting: Obstetrics and Gynecology

## 2018-03-15 ENCOUNTER — Other Ambulatory Visit: Payer: Self-pay

## 2018-03-15 ENCOUNTER — Other Ambulatory Visit (HOSPITAL_COMMUNITY)
Admission: RE | Admit: 2018-03-15 | Discharge: 2018-03-15 | Disposition: A | Discharge status: Home / Self Care | Payer: Medicaid Other | Source: Ambulatory Visit | Attending: Obstetrics and Gynecology | Admitting: Obstetrics and Gynecology

## 2018-03-15 ENCOUNTER — Other Ambulatory Visit: Payer: Medicaid Other

## 2018-03-15 ENCOUNTER — Ambulatory Visit (INDEPENDENT_AMBULATORY_CARE_PROVIDER_SITE_OTHER): Payer: Medicaid Other | Admitting: Obstetrics and Gynecology

## 2018-03-15 VITALS — BP 123/69 | HR 94 | Wt 242.8 lb

## 2018-03-15 DIAGNOSIS — M5430 Sciatica, unspecified side: Secondary | ICD-10-CM

## 2018-03-15 DIAGNOSIS — Z3483 Encounter for supervision of other normal pregnancy, third trimester: Secondary | ICD-10-CM | POA: Diagnosis present

## 2018-03-15 DIAGNOSIS — O10913 Unspecified pre-existing hypertension complicating pregnancy, third trimester: Secondary | ICD-10-CM

## 2018-03-15 DIAGNOSIS — R87612 Low grade squamous intraepithelial lesion on cytologic smear of cervix (LGSIL): Secondary | ICD-10-CM

## 2018-03-15 DIAGNOSIS — O09293 Supervision of pregnancy with other poor reproductive or obstetric history, third trimester: Secondary | ICD-10-CM

## 2018-03-15 DIAGNOSIS — Z98891 History of uterine scar from previous surgery: Secondary | ICD-10-CM

## 2018-03-15 DIAGNOSIS — O09299 Supervision of pregnancy with other poor reproductive or obstetric history, unspecified trimester: Secondary | ICD-10-CM

## 2018-03-15 DIAGNOSIS — Z3480 Encounter for supervision of other normal pregnancy, unspecified trimester: Secondary | ICD-10-CM

## 2018-03-15 DIAGNOSIS — O10919 Unspecified pre-existing hypertension complicating pregnancy, unspecified trimester: Secondary | ICD-10-CM

## 2018-03-15 NOTE — Progress Notes (Signed)
ROB/NST/GBS.  C/o pain

## 2018-03-15 NOTE — Progress Notes (Signed)
   PRENATAL VISIT NOTE  Subjective:  Kristine Perez is a 27 y.o. G2P1001 at 2079w0d being seen today for ongoing prenatal care.  She is currently monitored for the following issues for this high-risk pregnancy and has GERD without esophagitis; Eye injury; Supervision of other normal pregnancy, antepartum; H/O: C-section; Current singleton pregnancy with history of congenital heart disease in prior child, antepartum; Chronic hypertension affecting pregnancy; Suspected fetal anomaly, antepartum; Back pain complicating pregnancy; LGSIL on Pap smear of cervix; and Ptyalism on their problem list.  Patient reports occasional contractions.  Contractions: Irregular. Vag. Bleeding: None.  Movement: Present. Denies leaking of fluid.   The following portions of the patient's history were reviewed and updated as appropriate: allergies, current medications, past family history, past medical history, past social history, past surgical history and problem list. Problem list updated.  Objective:   Vitals:   03/15/18 1450  BP: 123/69  Pulse: 94  Weight: 242 lb 12.8 oz (110.1 kg)    Fetal Status: Fetal Heart Rate (bpm): NST/AFI   Movement: Present     General:  Alert, oriented and cooperative. Patient is in no acute distress.  Skin: Skin is warm and dry. No rash noted.   Cardiovascular: Normal heart rate noted  Respiratory: Normal respiratory effort, no problems with respiration noted  Abdomen: Soft, gravid, appropriate for gestational age.  Pain/Pressure: Present     Pelvic: Cervical exam deferred        Extremities: Normal range of motion.  Edema: Trace  Mental Status: Normal mood and affect. Normal behavior. Normal judgment and thought content.   Assessment and Plan:  Pregnancy: G2P1001 at 2579w0d  1. Supervision of other normal pregnancy, antepartum  2. H/O: C-section For TOLAC  3. Current singleton pregnancy with history of congenital heart disease in prior child, antepartum Normal  echo  4. LGSIL on Pap smear of cervix  5. Chronic hypertension affecting pregnancy BP stable Last growth 54th%ile NST reactive  6. Sciatica Patient states she has significant sciatica pain and has been out of work for a week, has seen PT once during pregnancy Will refer again to PT since she is having significant symptoms   Preterm labor symptoms and general obstetric precautions including but not limited to vaginal bleeding, contractions, leaking of fluid and fetal movement were reviewed in detail with the patient. Please refer to After Visit Summary for other counseling recommendations.  Return in about 1 week (around 03/22/2018) for OB visit (MD).  Future Appointments  Date Time Provider Department Center  03/17/2018 10:00 AM [facility] NURSE [facility] None  03/21/2018 12:30 PM Lavinia SharpsSimpson, Stacy C, PT [facility]  03/21/2018  2:30 PM [facility]TR[facility]OUND [facility] None  03/21/2018  3:30 PM Adam PhenixArnold, James G, MD [facility] None  03/24/2018 10:15 AM [facility] NURSE [facility] None  03/28/2018  2:45 PM [facility]TRAS[facility] None  03/28/2018  3:30 PM Conan Bowensavis, Jolaine Fryberger M, MD [facility] None  03/31/2018 10:00 AM [facility] NURSE [facility] None    Conan BowensKelly M Laurie Lovejoy, MD

## 2018-03-16 LAB — CERVICOVAGINAL ANCILLARY ONLY
Chlamydia: NEGATIVE
Neisseria Gonorrhea: NEGATIVE

## 2018-03-17 ENCOUNTER — Other Ambulatory Visit: Payer: Medicaid Other

## 2018-03-17 LAB — STREP GP B NAA: Strep Gp B NAA: NEGATIVE

## 2018-03-21 ENCOUNTER — Encounter: Payer: Self-pay | Admitting: Physical Therapy

## 2018-03-21 ENCOUNTER — Telehealth: Payer: Self-pay

## 2018-03-21 ENCOUNTER — Other Ambulatory Visit: Payer: Medicaid Other

## 2018-03-21 ENCOUNTER — Other Ambulatory Visit: Payer: Self-pay

## 2018-03-21 ENCOUNTER — Encounter: Payer: Self-pay | Admitting: Obstetrics & Gynecology

## 2018-03-21 ENCOUNTER — Ambulatory Visit: Payer: Medicaid Other | Attending: Obstetrics and Gynecology | Admitting: Physical Therapy

## 2018-03-21 ENCOUNTER — Ambulatory Visit (INDEPENDENT_AMBULATORY_CARE_PROVIDER_SITE_OTHER): Payer: Medicaid Other | Admitting: Obstetrics & Gynecology

## 2018-03-21 VITALS — BP 127/79 | HR 103 | Wt 244.9 lb

## 2018-03-21 DIAGNOSIS — M5442 Lumbago with sciatica, left side: Secondary | ICD-10-CM | POA: Diagnosis present

## 2018-03-21 DIAGNOSIS — M5441 Lumbago with sciatica, right side: Secondary | ICD-10-CM | POA: Diagnosis present

## 2018-03-21 DIAGNOSIS — O10919 Unspecified pre-existing hypertension complicating pregnancy, unspecified trimester: Secondary | ICD-10-CM

## 2018-03-21 DIAGNOSIS — Z3480 Encounter for supervision of other normal pregnancy, unspecified trimester: Secondary | ICD-10-CM

## 2018-03-21 DIAGNOSIS — O10913 Unspecified pre-existing hypertension complicating pregnancy, third trimester: Secondary | ICD-10-CM

## 2018-03-21 DIAGNOSIS — Z3483 Encounter for supervision of other normal pregnancy, third trimester: Secondary | ICD-10-CM

## 2018-03-21 NOTE — Progress Notes (Signed)
   PRENATAL VISIT NOTE  Subjective:  Kristine Perez is a 27 y.o. G2P1001 at 4941w6d being seen today for ongoing prenatal care.  She is currently monitored for the following issues for this high-risk pregnancy and has GERD without esophagitis; Supervision of other normal pregnancy, antepartum; H/O: C-section; Current singleton pregnancy with history of congenital heart disease in prior child, antepartum; Chronic hypertension affecting pregnancy; Suspected fetal anomaly, antepartum; Back pain complicating pregnancy; LGSIL on Pap smear of cervix; and Ptyalism on their problem list.  Patient reports backache.  Contractions: Irregular. Vag. Bleeding: None.  Movement: Present. Denies leaking of fluid.   The following portions of the patient's history were reviewed and updated as appropriate: allergies, current medications, past family history, past medical history, past social history, past surgical history and problem list. Problem list updated.  Objective:   Vitals:   03/21/18 1522  BP: 127/79  Pulse: (!) 103  Weight: 244 lb 14.4 oz (111.1 kg)    Fetal Status: Fetal Heart Rate (bpm): NST/AFI   Movement: Present     General:  Alert, oriented and cooperative. Patient is in no acute distress.  Skin: Skin is warm and dry. No rash noted.   Cardiovascular: Normal heart rate noted  Respiratory: Normal respiratory effort, no problems with respiration noted  Abdomen: Soft, gravid, appropriate for gestational age.  Pain/Pressure: Present     Pelvic: Cervical exam deferred        Extremities: Normal range of motion.  Edema: Trace  Mental Status: Normal mood and affect. Normal behavior. Normal judgment and thought content.   Assessment and Plan:  Pregnancy: G2P1001 at 3041w6d  1. Chronic hypertension affecting pregnancy Nl BP no medication - Fetal nonstress test; Future - US OB Limited; Future NST AFI nl today 2. Supervision of other normal pregnancy, antepartum   Preterm labor symptoms and  general obstetric precautions including but not limited to vaginal bleeding, contractions, leaking of fluid and fetal movement were reviewed in detail with the patient. Please refer to After Visit Summary for other counseling recommendations.  Return in about 1 week (around 03/28/2018) for NST afi.  Future Appointments  Date Time Provider Department Center  03/24/2018 10:15 AM [facility] N[facility]SE [facility] None  03/28/2018  2:45 PM [facility]TR[facility] None  03/28/2018  3:30 PM Conan Bowensavis, Kelly M, MD [facility] None  03/31/2018 10:00 AM [facility] N[facility]SE [facility] None    Scheryl DarterJames Elzia Hott, MD

## 2018-03-21 NOTE — Progress Notes (Signed)
HOB/NST/AFI. Pt states she has an abscess near her vagina that she would like the dr. To look at. Reactive NST.

## 2018-03-21 NOTE — Patient Instructions (Signed)
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
## 2018-03-21 NOTE — Telephone Encounter (Signed)
Pt called stating Dr. Debroah LoopArnold told her she was going to have an antibiotic called in for an abscess she has. No notes seen in chart. I told pt we would send Dr. Debroah LoopArnold a message and try to get something called in ASAP. Pt verbalized understanding.

## 2018-03-22 ENCOUNTER — Other Ambulatory Visit: Payer: Self-pay | Admitting: Obstetrics & Gynecology

## 2018-03-22 ENCOUNTER — Encounter (INDEPENDENT_AMBULATORY_CARE_PROVIDER_SITE_OTHER): Payer: Self-pay

## 2018-03-22 DIAGNOSIS — L0231 Cutaneous abscess of buttock: Secondary | ICD-10-CM

## 2018-03-22 DIAGNOSIS — L0291 Cutaneous abscess, unspecified: Secondary | ICD-10-CM | POA: Insufficient documentation

## 2018-03-22 MED ORDER — AMOXICILLIN-POT CLAVULANATE 875-125 MG PO TABS: 1.0000 | ORAL_TABLET | Freq: Two times a day (BID) | ORAL | 1 refills | Status: DC

## 2018-03-22 NOTE — Progress Notes (Signed)
Antibiotic rx sent to pharmacy for skin abscess right buttock

## 2018-03-24 ENCOUNTER — Ambulatory Visit (INDEPENDENT_AMBULATORY_CARE_PROVIDER_SITE_OTHER): Payer: Medicaid Other

## 2018-03-24 VITALS — BP 130/78 | HR 90 | Wt 250.0 lb

## 2018-03-24 DIAGNOSIS — O10919 Unspecified pre-existing hypertension complicating pregnancy, unspecified trimester: Secondary | ICD-10-CM

## 2018-03-24 DIAGNOSIS — O10913 Unspecified pre-existing hypertension complicating pregnancy, third trimester: Secondary | ICD-10-CM

## 2018-03-24 NOTE — Progress Notes (Addendum)
Nurse visit for NST only dx: HTN. Reviewed with provider, NST reactive today.   Reviewed NST strip. Baseline FHT 140 with moderate variability and 15x15 accels noted.  No contractions and no decelerations.   Reactive NST  Nolene BernheimERRI BURLESON, RN, MSN, NP-BC Nurse Practitioner, [facility], [facility] 03/24/2018 12:42 PM

## 2018-03-28 ENCOUNTER — Other Ambulatory Visit: Payer: Medicaid Other

## 2018-03-28 ENCOUNTER — Ambulatory Visit (INDEPENDENT_AMBULATORY_CARE_PROVIDER_SITE_OTHER): Payer: Medicaid Other | Admitting: Obstetrics and Gynecology

## 2018-03-28 ENCOUNTER — Encounter: Payer: Self-pay | Admitting: Obstetrics and Gynecology

## 2018-03-28 VITALS — BP 129/83 | HR 103 | Wt 249.4 lb

## 2018-03-28 DIAGNOSIS — O10913 Unspecified pre-existing hypertension complicating pregnancy, third trimester: Secondary | ICD-10-CM

## 2018-03-28 DIAGNOSIS — Z3480 Encounter for supervision of other normal pregnancy, unspecified trimester: Secondary | ICD-10-CM

## 2018-03-28 DIAGNOSIS — Z98891 History of uterine scar from previous surgery: Secondary | ICD-10-CM

## 2018-03-28 DIAGNOSIS — O09299 Supervision of pregnancy with other poor reproductive or obstetric history, unspecified trimester: Secondary | ICD-10-CM

## 2018-03-28 DIAGNOSIS — R87612 Low grade squamous intraepithelial lesion on cytologic smear of cervix (LGSIL): Secondary | ICD-10-CM

## 2018-03-28 DIAGNOSIS — O10919 Unspecified pre-existing hypertension complicating pregnancy, unspecified trimester: Secondary | ICD-10-CM

## 2018-03-28 DIAGNOSIS — O359XX0 Maternal care for (suspected) fetal abnormality and damage, unspecified, not applicable or unspecified: Secondary | ICD-10-CM

## 2018-03-28 NOTE — Progress Notes (Signed)
   PRENATAL VISIT NOTE  Subjective:  Kristine Perez is a 27 y.o. G2P1001 at 5273w6d being seen today for ongoing prenatal care.  She is currently monitored for the following issues for this high-risk pregnancy and has GERD without esophagitis; Supervision of other normal pregnancy, antepartum; H/O: C-section; Current singleton pregnancy with history of congenital heart disease in prior child, antepartum; Chronic hypertension affecting pregnancy; Suspected fetal anomaly, antepartum; Back pain complicating pregnancy; LGSIL on Pap smear of cervix; Ptyalism; and Skin abscess on their problem list.  Patient reports occasional contractions.  Contractions: Irregular. Vag. Bleeding: None.  Movement: Present. Denies leaking of fluid.   The following portions of the patient's history were reviewed and updated as appropriate: allergies, current medications, past family history, past medical history, past social history, past surgical history and problem list. Problem list updated.  Objective:   Vitals:   03/28/18 1515  BP: 129/83  Pulse: (!) 103  Weight: 249 lb 6.4 oz (113.1 kg)    Fetal Status:     Movement: Present  Presentation: Vertex  General:  Alert, oriented and cooperative. Patient is in no acute distress.  Skin: Skin is warm and dry. No rash noted.   Cardiovascular: Normal heart rate noted  Respiratory: Normal respiratory effort, no problems with respiration noted  Abdomen: Soft, gravid, appropriate for gestational age.  Pain/Pressure: Present     Pelvic: Cervical exam deferred        Extremities: Normal range of motion.  Edema: Trace  Mental Status: Normal mood and affect. Normal behavior. Normal judgment and thought content.   Assessment and Plan:  Pregnancy: G2P1001 at 4673w6d  1. Chronic hypertension affecting pregnancy BP stable no meds NST reactive AFI wnl Will plan for IOL 40 weeks  2. LGSIL on Pap smear of cervix  3. Suspected fetal anomaly, antepartum, single or  unspecified fetus  4. Current singleton pregnancy with history of congenital heart disease in prior child, antepartum Normal echo  5. H/O: C-section For TOLAC  6. Supervision of other normal pregnancy, antepartum  Term labor symptoms and general obstetric precautions including but not limited to vaginal bleeding, contractions, leaking of fluid and fetal movement were reviewed in detail with the patient. Please refer to After Visit Summary for other counseling recommendations.  Return in about 1 week (around 04/04/2018) for OB visit (MD).  Future Appointments  Date Time Provider Department Center  03/31/2018 10:00 AM [facility] NURSE [facility] None    Conan BowensKelly M Breshae Belcher, MD

## 2018-03-28 NOTE — Progress Notes (Signed)
Pt presents for ROB/AFI/NST dx: GHTN.

## 2018-03-31 ENCOUNTER — Other Ambulatory Visit: Payer: Medicaid Other

## 2018-04-03 ENCOUNTER — Ambulatory Visit (INDEPENDENT_AMBULATORY_CARE_PROVIDER_SITE_OTHER): Payer: Medicaid Other

## 2018-04-03 VITALS — BP 117/77 | HR 86 | Wt 248.2 lb

## 2018-04-03 DIAGNOSIS — O10913 Unspecified pre-existing hypertension complicating pregnancy, third trimester: Secondary | ICD-10-CM

## 2018-04-03 DIAGNOSIS — O10919 Unspecified pre-existing hypertension complicating pregnancy, unspecified trimester: Secondary | ICD-10-CM

## 2018-04-03 NOTE — Progress Notes (Signed)
Patient with CHTN at 38 weeks here for antenatal testing 7/1 NST reviewed and reactive with baseline 140, mod variability, + accels no decel

## 2018-04-04 ENCOUNTER — Other Ambulatory Visit: Payer: Medicaid Other

## 2018-04-05 ENCOUNTER — Other Ambulatory Visit: Payer: Medicaid Other

## 2018-04-05 ENCOUNTER — Telehealth (HOSPITAL_COMMUNITY): Payer: Self-pay | Admitting: *Deleted

## 2018-04-05 ENCOUNTER — Inpatient Hospital Stay (HOSPITAL_COMMUNITY)
Admission: AD | Admit: 2018-04-05 | Discharge: 2018-04-08 | DRG: 807 | Disposition: A | Discharge status: Home / Self Care | Payer: Medicaid Other | Attending: Obstetrics & Gynecology | Admitting: Obstetrics & Gynecology

## 2018-04-05 ENCOUNTER — Ambulatory Visit (INDEPENDENT_AMBULATORY_CARE_PROVIDER_SITE_OTHER): Payer: Medicaid Other | Admitting: Obstetrics & Gynecology

## 2018-04-05 ENCOUNTER — Ambulatory Visit (HOSPITAL_BASED_OUTPATIENT_CLINIC_OR_DEPARTMENT_OTHER)
Admission: RE | Admit: 2018-04-05 | Discharge: 2018-04-05 | Disposition: A | Discharge status: Home / Self Care | Payer: Medicaid Other | Source: Ambulatory Visit | Attending: Obstetrics & Gynecology | Admitting: Obstetrics & Gynecology

## 2018-04-05 ENCOUNTER — Other Ambulatory Visit: Payer: Self-pay | Admitting: Obstetrics & Gynecology

## 2018-04-05 VITALS — BP 125/76 | HR 94 | Wt 252.0 lb

## 2018-04-05 DIAGNOSIS — Z3480 Encounter for supervision of other normal pregnancy, unspecified trimester: Secondary | ICD-10-CM

## 2018-04-05 DIAGNOSIS — O10913 Unspecified pre-existing hypertension complicating pregnancy, third trimester: Secondary | ICD-10-CM | POA: Insufficient documentation

## 2018-04-05 DIAGNOSIS — O288 Other abnormal findings on antenatal screening of mother: Secondary | ICD-10-CM

## 2018-04-05 DIAGNOSIS — O9089 Other complications of the puerperium, not elsewhere classified: Secondary | ICD-10-CM | POA: Diagnosis not present

## 2018-04-05 DIAGNOSIS — Z98891 History of uterine scar from previous surgery: Secondary | ICD-10-CM

## 2018-04-05 DIAGNOSIS — O99213 Obesity complicating pregnancy, third trimester: Secondary | ICD-10-CM | POA: Insufficient documentation

## 2018-04-05 DIAGNOSIS — Z3490 Encounter for supervision of normal pregnancy, unspecified, unspecified trimester: Secondary | ICD-10-CM

## 2018-04-05 DIAGNOSIS — Z3A39 39 weeks gestation of pregnancy: Secondary | ICD-10-CM | POA: Insufficient documentation

## 2018-04-05 DIAGNOSIS — O10919 Unspecified pre-existing hypertension complicating pregnancy, unspecified trimester: Secondary | ICD-10-CM

## 2018-04-05 DIAGNOSIS — O289 Unspecified abnormal findings on antenatal screening of mother: Secondary | ICD-10-CM

## 2018-04-05 DIAGNOSIS — O1002 Pre-existing essential hypertension complicating childbirth: Principal | ICD-10-CM | POA: Diagnosis present

## 2018-04-05 DIAGNOSIS — R51 Headache: Secondary | ICD-10-CM | POA: Diagnosis not present

## 2018-04-05 DIAGNOSIS — O09299 Supervision of pregnancy with other poor reproductive or obstetric history, unspecified trimester: Secondary | ICD-10-CM

## 2018-04-05 DIAGNOSIS — O34219 Maternal care for unspecified type scar from previous cesarean delivery: Secondary | ICD-10-CM | POA: Diagnosis present

## 2018-04-05 DIAGNOSIS — O10013 Pre-existing essential hypertension complicating pregnancy, third trimester: Secondary | ICD-10-CM

## 2018-04-05 DIAGNOSIS — R87612 Low grade squamous intraepithelial lesion on cytologic smear of cervix (LGSIL): Secondary | ICD-10-CM

## 2018-04-05 NOTE — Progress Notes (Signed)
   PRENATAL VISIT NOTE  Subjective:  Kristine Perez is a 27 y.o. G2P1001 at 171w0d being seen today for ongoing prenatal care.  She is currently monitored for the following issues for this high-risk pregnancy and has GERD without esophagitis; Supervision of other normal pregnancy, antepartum; H/O: C-section; Current singleton pregnancy with history of congenital heart disease in prior child, antepartum; Chronic hypertension affecting pregnancy; Suspected fetal anomaly, antepartum; Back pain complicating pregnancy; LGSIL on Pap smear of cervix; Ptyalism; and Skin abscess on their problem list.  Patient reports no complaints.  Contractions: Irregular. Vag. Bleeding: None.  Movement: Present. Denies leaking of fluid.   The following portions of the patient's history were reviewed and updated as appropriate: allergies, current medications, past family history, past medical history, past social history, past surgical history and problem list. Problem list updated.  Objective:   Vitals:   04/05/18 1009  BP: 125/76  Pulse: 94  Weight: 252 lb (114.3 kg)    Fetal Status: Fetal Heart Rate (bpm): NST   Movement: Present     General:  Alert, oriented and cooperative. Patient is in no acute distress.  Skin: Skin is warm and dry. No rash noted.   Cardiovascular: Normal heart rate noted  Respiratory: Normal respiratory effort, no problems with respiration noted  Abdomen: Soft, gravid, appropriate for gestational age.  Pain/Pressure: Present     Pelvic: Cervical exam deferred        Extremities: Normal range of motion.     Mental Status: Normal mood and affect. Normal behavior. Normal judgment and thought content.   Assessment and Plan:  Pregnancy: G2P1001 at 6471w0d  1. Chronic hypertension affecting pregnancy 2/week testing - US MFM FETAL BPP WO NON STRESS; Future  2. Supervision of other normal pregnancy, antepartum IOL 40 weeks, TOLAC - US MFM FETAL BPP WO NON STRESS; Future  Term labor  symptoms and general obstetric precautions including but not limited to vaginal bleeding, contractions, leaking of fluid and fetal movement were reviewed in detail with the patient. Please refer to After Visit Summary for other counseling recommendations.  Return in about 5 days (around 04/10/2018) for NST.  Future Appointments  Date Time Provider Department Center  04/05/2018  1:00 PM [facility] US 1 [facility]    Scheryl DarterJames Edder Bellanca, MD

## 2018-04-05 NOTE — Telephone Encounter (Signed)
Preadmission screen  

## 2018-04-05 NOTE — MAU Note (Signed)
Pt reports contractions since 5p now every 2-3 mins. Pt denies LOF or vaginal bleeding. Reports good fetal movement today. Cervix has not been examined. Pt is planning TOLAC.

## 2018-04-05 NOTE — Patient Instructions (Signed)
Labor Induction Labor induction is when steps are taken to cause a pregnant woman to begin the labor process. Most women go into labor on their own between 37 weeks and 42 weeks of the pregnancy. When this does not happen or when there is a medical need, methods may be used to induce labor. Labor induction causes a pregnant woman's uterus to contract. It also causes the cervix to soften (ripen), open (dilate), and thin out (efface). Usually, labor is not induced before 39 weeks of the pregnancy unless there is a problem with the baby or mother. Before inducing labor, your health care provider will consider a number of factors, including the following:  The medical condition of you and the baby.  How many weeks along you are.  The status of the baby's lung maturity.  The condition of the cervix.  The position of the baby. What are the reasons for labor induction? Labor may be induced for the following reasons:  The health of the baby or mother is at risk.  The pregnancy is overdue by 1 week or more.  The water breaks but labor does not start on its own.  The mother has a health condition or serious illness, such as high blood pressure, infection, placental abruption, or diabetes.  The amniotic fluid amounts are low around the baby.  The baby is distressed. Convenience or wanting the baby to be born on a certain date is not a reason for inducing labor. What methods are used for labor induction? Several methods of labor induction may be used, such as:  Prostaglandin medicine. This medicine causes the cervix to dilate and ripen. The medicine will also start contractions. It can be taken by mouth or by inserting a suppository into the vagina.  Inserting a thin tube (catheter) with a balloon on the end into the vagina to dilate the cervix. Once inserted, the balloon is expanded with water, which causes the cervix to open.  Stripping the membranes. Your health care provider separates  amniotic sac tissue from the cervix, causing the cervix to be stretched and causing the release of a hormone called progesterone. This may cause the uterus to contract. It is often done during an office visit. You will be sent home to wait for the contractions to begin. You will then come in for an induction.  Breaking the water. Your health care provider makes a hole in the amniotic sac using a small instrument. Once the amniotic sac breaks, contractions should begin. This may still take hours to see an effect.  Medicine to trigger or strengthen contractions. This medicine is given through an IV access tube inserted into a vein in your arm. All of the methods of induction, besides stripping the membranes, will be done in the hospital. Induction is done in the hospital so that you and the baby can be carefully monitored. How long does it take for labor to be induced? Some inductions can take up to 2-3 days. Depending on the cervix, it usually takes less time. It takes longer when you are induced early in the pregnancy or if this is your first pregnancy. If a mother is still pregnant and the induction has been going on for 2-3 days, either the mother will be sent home or a cesarean delivery will be needed. What are the risks associated with labor induction? Some of the risks of induction include:  Changes in fetal heart rate, such as too high, too low, or erratic.  Fetal distress.    Chance of infection for the mother and baby.  Increased chance of having a cesarean delivery.  Breaking off (abruption) of the placenta from the uterus (rare).  Uterine rupture (very rare). When induction is needed for medical reasons, the benefits of induction may outweigh the risks. What are some reasons for not inducing labor? Labor induction should not be done if:  It is shown that your baby does not tolerate labor.  You have had previous surgeries on your uterus, such as a myomectomy or the removal of  fibroids.  Your placenta lies very low in the uterus and blocks the opening of the cervix (placenta previa).  Your baby is not in a head-down position.  The umbilical cord drops down into the birth canal in front of the baby. This could cut off the baby's blood and oxygen supply.  You have had a previous cesarean delivery.  There are unusual circumstances, such as the baby being extremely premature. This information is not intended to replace advice given to you by your health care provider. Make sure you discuss any questions you have with your health care provider. Document Released: 02/09/2007 Document Revised: 02/26/2016 Document Reviewed: 04/19/2013 Elsevier Interactive Patient Education  2017 [employer]  

## 2018-04-06 ENCOUNTER — Encounter (HOSPITAL_COMMUNITY): Payer: Self-pay | Admitting: Anesthesiology

## 2018-04-06 ENCOUNTER — Encounter (HOSPITAL_COMMUNITY): Payer: Self-pay | Admitting: *Deleted

## 2018-04-06 DIAGNOSIS — Z3483 Encounter for supervision of other normal pregnancy, third trimester: Secondary | ICD-10-CM | POA: Diagnosis present

## 2018-04-06 DIAGNOSIS — O1092 Unspecified pre-existing hypertension complicating childbirth: Secondary | ICD-10-CM

## 2018-04-06 DIAGNOSIS — Z3A39 39 weeks gestation of pregnancy: Secondary | ICD-10-CM | POA: Diagnosis not present

## 2018-04-06 DIAGNOSIS — R51 Headache: Secondary | ICD-10-CM | POA: Diagnosis not present

## 2018-04-06 DIAGNOSIS — O9089 Other complications of the puerperium, not elsewhere classified: Secondary | ICD-10-CM | POA: Diagnosis not present

## 2018-04-06 DIAGNOSIS — O34219 Maternal care for unspecified type scar from previous cesarean delivery: Secondary | ICD-10-CM | POA: Diagnosis present

## 2018-04-06 DIAGNOSIS — Z3490 Encounter for supervision of normal pregnancy, unspecified, unspecified trimester: Secondary | ICD-10-CM

## 2018-04-06 DIAGNOSIS — O1002 Pre-existing essential hypertension complicating childbirth: Secondary | ICD-10-CM | POA: Diagnosis present

## 2018-04-06 LAB — TYPE AND SCREEN
ABO/RH(D): O POS
Antibody Screen: NEGATIVE

## 2018-04-06 LAB — CBC
HCT: 26 % — ABNORMAL LOW (ref 36.0–46.0)
Hemoglobin: 8.3 g/dL — ABNORMAL LOW (ref 12.0–15.0)
MCH: 23.1 pg — AB (ref 26.0–34.0)
MCHC: 31.9 g/dL (ref 30.0–36.0)
MCV: 72.2 fL — AB (ref 78.0–100.0)
PLATELETS: 283 10*3/uL (ref 150–400)
RBC: 3.6 MIL/uL — ABNORMAL LOW (ref 3.87–5.11)
RDW: 14.8 % (ref 11.5–15.5)
WBC: 8.1 10*3/uL (ref 4.0–10.5)

## 2018-04-06 LAB — COMPREHENSIVE METABOLIC PANEL
ALK PHOS: 76 U/L (ref 38–126)
ALT: 8 U/L (ref 0–44)
AST: 15 U/L (ref 15–41)
Albumin: 2.9 g/dL — ABNORMAL LOW (ref 3.5–5.0)
Anion gap: 12 (ref 5–15)
BUN: 8 mg/dL (ref 6–20)
CALCIUM: 8.8 mg/dL — AB (ref 8.9–10.3)
CO2: 19 mmol/L — ABNORMAL LOW (ref 22–32)
CREATININE: 0.5 mg/dL (ref 0.44–1.00)
Chloride: 104 mmol/L (ref 98–111)
Glucose, Bld: 89 mg/dL (ref 70–99)
Potassium: 3.5 mmol/L (ref 3.5–5.1)
Sodium: 135 mmol/L (ref 135–145)
Total Bilirubin: 0.4 mg/dL (ref 0.3–1.2)
Total Protein: 6.7 g/dL (ref 6.5–8.1)

## 2018-04-06 MED ORDER — LACTATED RINGERS IV SOLN
500.0000 mL | Freq: Once | INTRAVENOUS | Status: AC
  Administered 2018-04-06: 500 mL via INTRAVENOUS

## 2018-04-06 MED ORDER — BUTORPHANOL TARTRATE 1 MG/ML IJ SOLN
2.0000 mg | Freq: Once | INTRAMUSCULAR | Status: AC
Start: 2018-04-06 — End: 2018-04-06
  Administered 2018-04-06: 2 mg via INTRAVENOUS
  Filled 2018-04-06: qty 2

## 2018-04-06 MED ORDER — LACTATED RINGERS IV SOLN: 500.0000 mL | INTRAVENOUS | Status: DC | PRN

## 2018-04-06 MED ORDER — ONDANSETRON HCL 4 MG PO TABS: 4.0000 mg | ORAL_TABLET | ORAL | Status: DC | PRN

## 2018-04-06 MED ORDER — SOD CITRATE-CITRIC ACID 500-334 MG/5ML PO SOLN: 30.0000 mL | ORAL | Status: DC | PRN

## 2018-04-06 MED ORDER — ACETAMINOPHEN 325 MG PO TABS
650.0000 mg | ORAL_TABLET | ORAL | Status: DC | PRN
  Administered 2018-04-06 – 2018-04-07 (×3): 650 mg via ORAL
  Filled 2018-04-06 (×3): qty 2

## 2018-04-06 MED ORDER — ONDANSETRON HCL 4 MG/2ML IJ SOLN: 4.0000 mg | INTRAMUSCULAR | Status: DC | PRN

## 2018-04-06 MED ORDER — FENTANYL CITRATE (PF) 100 MCG/2ML IJ SOLN
100.0000 ug | INTRAMUSCULAR | Status: DC | PRN
  Filled 2018-04-06: qty 2

## 2018-04-06 MED ORDER — LIDOCAINE HCL (PF) 1 % IJ SOLN
30.0000 mL | INTRAMUSCULAR | Status: AC | PRN
  Administered 2018-04-06: 30 mL via SUBCUTANEOUS
  Filled 2018-04-06: qty 30

## 2018-04-06 MED ORDER — LACTATED RINGERS IV SOLN
INTRAVENOUS | Status: DC
  Administered 2018-04-06: 01:00:00 via INTRAVENOUS

## 2018-04-06 MED ORDER — PRENATAL MULTIVITAMIN CH
1.0000 | ORAL_TABLET | Freq: Every day | ORAL | Status: DC
  Administered 2018-04-06 – 2018-04-08 (×3): 1 via ORAL
  Filled 2018-04-06 (×4): qty 1

## 2018-04-06 MED ORDER — DIBUCAINE 1 % RE OINT: 1.0000 "application " | TOPICAL_OINTMENT | RECTAL | Status: DC | PRN

## 2018-04-06 MED ORDER — SIMETHICONE 80 MG PO CHEW: 80.0000 mg | CHEWABLE_TABLET | ORAL | Status: DC | PRN

## 2018-04-06 MED ORDER — FENTANYL CITRATE (PF) 100 MCG/2ML IJ SOLN
INTRAMUSCULAR | Status: AC
  Administered 2018-04-06: 100 ug
  Filled 2018-04-06: qty 2

## 2018-04-06 MED ORDER — ONDANSETRON HCL 4 MG/2ML IJ SOLN: 4.0000 mg | Freq: Four times a day (QID) | INTRAMUSCULAR | Status: DC | PRN

## 2018-04-06 MED ORDER — DIPHENHYDRAMINE HCL 25 MG PO CAPS: 25.0000 mg | ORAL_CAPSULE | Freq: Four times a day (QID) | ORAL | Status: DC | PRN

## 2018-04-06 MED ORDER — TETANUS-DIPHTH-ACELL PERTUSSIS 5-2.5-18.5 LF-MCG/0.5 IM SUSP
0.5000 mL | Freq: Once | INTRAMUSCULAR | Status: AC
  Administered 2018-04-08: 0.5 mL via INTRAMUSCULAR
  Filled 2018-04-06: qty 0.5

## 2018-04-06 MED ORDER — OXYTOCIN BOLUS FROM INFUSION
500.0000 mL | Freq: Once | INTRAVENOUS | Status: AC
  Administered 2018-04-06: 500 mL via INTRAVENOUS

## 2018-04-06 MED ORDER — COCONUT OIL OIL: 1.0000 "application " | TOPICAL_OIL | Status: DC | PRN

## 2018-04-06 MED ORDER — IBUPROFEN 600 MG PO TABS
600.0000 mg | ORAL_TABLET | Freq: Four times a day (QID) | ORAL | Status: DC
  Administered 2018-04-06 – 2018-04-08 (×9): 600 mg via ORAL
  Filled 2018-04-06 (×10): qty 1

## 2018-04-06 MED ORDER — BENZOCAINE-MENTHOL 20-0.5 % EX AERO
1.0000 "application " | INHALATION_SPRAY | CUTANEOUS | Status: DC | PRN
  Administered 2018-04-07: 1 via TOPICAL
  Filled 2018-04-06: qty 56

## 2018-04-06 MED ORDER — ZOLPIDEM TARTRATE 5 MG PO TABS: 5.0000 mg | ORAL_TABLET | Freq: Every evening | ORAL | Status: DC | PRN

## 2018-04-06 MED ORDER — FENTANYL 2.5 MCG/ML BUPIVACAINE 1/10 % EPIDURAL INFUSION (WH - ANES)
14.0000 mL/h | INTRAMUSCULAR | Status: DC | PRN
  Filled 2018-04-06: qty 100

## 2018-04-06 MED ORDER — WITCH HAZEL-GLYCERIN EX PADS: 1.0000 "application " | MEDICATED_PAD | CUTANEOUS | Status: DC | PRN

## 2018-04-06 MED ORDER — ACETAMINOPHEN 325 MG PO TABS: 650.0000 mg | ORAL_TABLET | ORAL | Status: DC | PRN

## 2018-04-06 MED ORDER — PROMETHAZINE HCL 25 MG/ML IJ SOLN
12.5000 mg | Freq: Once | INTRAMUSCULAR | Status: AC
  Administered 2018-04-06: 12.5 mg via INTRAVENOUS
  Filled 2018-04-06: qty 1

## 2018-04-06 MED ORDER — LACTATED RINGERS IV SOLN: INTRAVENOUS | Status: DC

## 2018-04-06 MED ORDER — SENNOSIDES-DOCUSATE SODIUM 8.6-50 MG PO TABS
2.0000 | ORAL_TABLET | ORAL | Status: DC
  Administered 2018-04-06 – 2018-04-08 (×2): 2 via ORAL
  Filled 2018-04-06 (×2): qty 2

## 2018-04-06 MED ORDER — DIPHENHYDRAMINE HCL 50 MG/ML IJ SOLN: 12.5000 mg | INTRAMUSCULAR | Status: DC | PRN

## 2018-04-06 MED ORDER — OXYTOCIN 40 UNITS IN LACTATED RINGERS INFUSION - SIMPLE MED
2.5000 [IU]/h | INTRAVENOUS | Status: DC
  Filled 2018-04-06: qty 1000

## 2018-04-06 MED ORDER — PHENYLEPHRINE 40 MCG/ML (10ML) SYRINGE FOR IV PUSH (FOR BLOOD PRESSURE SUPPORT)
80.0000 ug | PREFILLED_SYRINGE | INTRAVENOUS | Status: DC | PRN
  Filled 2018-04-06: qty 5
  Filled 2018-04-06: qty 10

## 2018-04-06 MED ORDER — EPHEDRINE 5 MG/ML INJ
10.0000 mg | INTRAVENOUS | Status: DC | PRN
  Filled 2018-04-06: qty 2

## 2018-04-06 MED ORDER — PHENYLEPHRINE 40 MCG/ML (10ML) SYRINGE FOR IV PUSH (FOR BLOOD PRESSURE SUPPORT)
80.0000 ug | PREFILLED_SYRINGE | INTRAVENOUS | Status: DC | PRN
  Filled 2018-04-06: qty 5
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
## 2018-04-06 NOTE — Anesthesia Preprocedure Evaluation (Deleted)
Anesthesia Evaluation  Patient identified by MRN, date of birth, ID band Patient awake    Reviewed: Allergy & Precautions  Airway Mallampati: II       Dental no notable dental hx.    Pulmonary    Pulmonary exam normal        Cardiovascular hypertension, Normal cardiovascular exam     Neuro/Psych  Headaches,    GI/Hepatic Neg liver ROS, GERD  ,  Endo/Other    Renal/GU      Musculoskeletal   Abdominal   Peds  Hematology negative hematology ROS (+)   Anesthesia Other Findings   Reproductive/Obstetrics (+) Pregnancy                             Anesthesia Physical Anesthesia Plan  ASA: III  Anesthesia Plan: Epidural   Post-op Pain Management:    Induction:   PONV Risk Score and Plan:   Airway Management Planned: Natural Airway  Additional Equipment: None  Intra-op Plan:   Post-operative Plan:   Informed Consent: I have reviewed the patients History and Physical, chart, labs and discussed the procedure including the risks, benefits and alternatives for the proposed anesthesia with the patient or authorized representative who has indicated his/her understanding and acceptance.     Plan Discussed with:   Anesthesia Plan Comments: (Lab Results      Component                Value               Date                      WBC                      8.1                 04/06/2018                HGB                      8.3 (L)             04/06/2018                HCT                      26.0 (L)            04/06/2018                MCV                      72.2 (L)            04/06/2018                PLT                      283                 04/06/2018           )        Anesthesia Quick Evaluation

## 2018-04-06 NOTE — Lactation Note (Signed)
This note was copied from a baby's chart. Lactation Consultation Note  Patient Name: Girl  DesanctisStarmecca Outten Today's Date: 04/06/2018 Reason for consult: Initial assessment  Initial visit at 10 hours of life. Mom is a P2 who lactated for 4.5 - 5 months w/her 1st child. Mom reports that her 1st child did not latch, so she pumped & BO (and reports an abundant supply).  Mom also reports that she had been leaking with this pregnancy.  Mom says that this infant is not having any issues with latching, but is interested in having me to return to assess latch. Mom has my # to call for assist w/next feeding.  Mom made aware of O/P services, breastfeeding support groups, community resources, and our phone # for post-discharge questions.    Lurline HareRichey, Keavon [facility]ensing [facility] 04/06/2018, 1:10 PM

## 2018-04-06 NOTE — MAU Note (Signed)
Pt refused to ambulate in hall saying she is ready for her epidural or any other pain med we could give her.

## 2018-04-07 MED ORDER — BUTALBITAL-APAP-CAFFEINE 50-325-40 MG PO TABS
1.0000 | ORAL_TABLET | ORAL | Status: DC | PRN
  Administered 2018-04-07 – 2018-04-08 (×4): 1 via ORAL
  Filled 2018-04-07 (×5): qty 1

## 2018-04-07 NOTE — Progress Notes (Signed)
Paged to room to assess for LLE swelling and pain. Patient states she noticed L leg swelling, upper thigh and L abdominal side pain. States it feels like pins and needles. Feels different than her previous sciatic pain. First noticed the pain about an hour ago when she got up to walk to the bathroom, noticed pain with ambulation. No bowel or bladder incontinence, urinating and passed bowel movement without difficulty. Also complaining of L sided throbbing headache for which she just recently received fioricet for. Has h/o migraines.  Blood pressure 138/72, pulse 97, temperature 98.4 F (36.9 C), temperature source Oral, resp. rate 18, height 5\' 6"  (1.676 m), weight 250 lb (113.4 kg), last menstrual period 07/06/2017, SpO2 100 %, unknown if currently breastfeeding.  Gen: alert, cooperative, no distress Ext: LE symmetric with no edema or erythema noted bilaterally. Homan's sign negative. Neuro: intact to sensation bilaterally, strength and ROM grossly intact.  A/P:  LE and side pain: most likely related to neuronal irritation from positional changes due to history and location of pain. Doesn't seem to be too bothered by the pain during interview and exam. Advised patient to frequently move positions, encouraged ambulation and advised to let us know if pain worsens or changes in character. No current indication for medication management. Patient agreeable with plan.  Headache: Just recently received Fioricet. BP stable with SBP in 130s. H/o cHTN but not requiring medication management. Will continue to monitor.  Ellwood DenseAlison Caelen Reierson, DO PGY-2, [facility] 04/07/2018 10:57 PM

## 2018-04-07 NOTE — Progress Notes (Signed)
Pt has had a left sided headache throughout night. Describes it as throbbing. BP remained low throughout night. Headache came on randomly and pt scored as a 10 on scale 0-10 for pain. 650 mg of Tylenol given and pt states some relief but pain still at a 7. Two more doses of tylenol given throughout night with last dose given at  0539 with no relief. Pt states headache up to an 8 now. Pt denise any epigastric pain, blurred vision. Plus one reflexes, clonus absent and no atypical edema noted on physical assessment.

## 2018-04-07 NOTE — Progress Notes (Addendum)
POSTPARTUM PROGRESS NOTE  Post Partum Day 1 Subjective:  Kristine Perez is a 27 y.o. [id number] 62w1ds/p SVD.  No acute events overnight.  Pt denies problems with ambulating, voiding or po intake.  She denies nausea or vomiting.  Pain is well controlled. Patient has been experiencing a headache since post partum which is currently being managed with tylenol 650 every 4 hours prn and is currently at a 7/10. Lochia Small.   Objective: Blood pressure 118/70, pulse 88, temperature 98 F (36.7 C), temperature source Oral, resp. rate 16, height '5\' 6"'$  (1.676 m), weight 113.4 kg (250 lb), last menstrual period 07/06/2017, SpO2 100 %, unknown if currently breastfeeding.  Physical Exam:  General: alert, cooperative and no distress Lochia:normal flow Chest: no respiratory distress Heart:regular rate, distal pulses intact Abdomen: soft, nontender,  Uterine Fundus: firm, appropriately tender DVT Evaluation: No calf swelling or tenderness Extremities: no edema  Recent Labs    04/06/18 0032  HGB 8.3*  HCT 26.0*    Assessment/Plan:  ASSESSMENT: Kristine BHARDWAJis a 27y.o. [id number]/p SVD  Breastfeeding and Contraception :patient unsure whether or not she would like depo  Schedule patient follow up for BP check with [facility] early next week   LOS: 1 day   LiSherie Don/02/2018, 7:30 AM   I have spoken with and examined this patient and agree with resident/PA-S/Med-S/SNM's note and plan of care. VSS, HRR&R, Resp unlabored, Legs neg.  FrNigel BertholdCNM 04/12/2018 4:41 PM

## 2018-04-08 LAB — RPR: RPR: NONREACTIVE

## 2018-04-08 MED ORDER — BUTALBITAL-APAP-CAFFEINE 50-325-40 MG PO TABS: 1.0000 | ORAL_TABLET | ORAL | 0 refills | Status: DC | PRN

## 2018-04-08 MED ORDER — IBUPROFEN 600 MG PO TABS: 600.0000 mg | ORAL_TABLET | Freq: Four times a day (QID) | ORAL | 0 refills | Status: DC

## 2018-04-08 NOTE — Lactation Note (Signed)
This note was copied from a baby's chart. Lactation Consultation Note  Patient Name: Girl Gordon DesanctisStarmecca Estrada ZOXWR'UToday's Date: 04/08/2018 Reason for consult: Follow-up assessment Mom is both breastfeeding and pumping and bottle feeding.  She states baby latches easily.  She is pumping with manual pump and obtaining 2-3 ounces per breast.  Mom denies questions or concerns.  Lactation outpatient services and support information reviewed and encouraged prn.  Maternal Data    Feeding Feeding Type: Bottle Fed - Breast Milk  LATCH Score                   Interventions Interventions: Hand pump  Lactation Tools Discussed/Used     Consult Status Consult Status: Complete Follow-up type: Call as needed    Huston FoleyMOULDEN, Jurney Overacker S 04/08/2018, 11:27 AM

## 2018-04-08 NOTE — Discharge Summary (Signed)
OB Discharge Summary     Patient Name: Kristine Perez DOB: Aug 03, 1991 MRN: [id number]  Date of admission: 04/05/2018 Delivering MD: Ellwood DenseUMBALL, ALISON   Date of discharge: 04/08/2018  Admitting diagnosis: 39wks, contractions Intrauterine pregnancy: 5052w1d     Secondary diagnosis:  Active Problems:   Pregnancy  Additional problems: previous CS, CHTN     Discharge diagnosis: VBAC                                                                                                Post partum procedures:none  Augmentation: none  Complications: None  Hospital course:  Onset of Labor With Vaginal Delivery     27 y.o. yo [id number] at 9752w1d was admitted in Active Labor on 04/05/2018. Patient had an uncomplicated labor course as follows:  Membrane Rupture Time/Date: 2:30 AM ,04/06/2018   Intrapartum Procedures: Episiotomy: None [1]                                         Lacerations:  1st degree [2];Labial [10];Vaginal [6]  Patient had a delivery of a Viable infant. 04/06/2018  Information for the patient's newborn:  Shaune Pascalarham, Girl Zareth [[id number]]  Delivery Method: VBAC, Spontaneous(Filed from Delivery Summary)    Pateint had an uncomplicated postpartum course.  She is ambulating, tolerating a regular diet, passing flatus, and urinating well. Patient is discharged home in stable condition on 04/08/18.   Physical exam  Vitals:   04/07/18 0536 04/07/18 1433 04/07/18 2157 04/08/18 0543  BP: 118/70 137/71 138/72 116/63  Pulse: 88 91 97 86  Resp: 16 18 18 16   Temp: 98 F (36.7 C) 98.1 F (36.7 C) 98.4 F (36.9 C) 98.3 F (36.8 C)  TempSrc: Oral Oral Oral Oral  SpO2:  100%    Weight:      Height:       General: alert, cooperative and no distress Lochia: appropriate Uterine Fundus: firm Incision: Healing well with no significant drainage, No significant erythema DVT Evaluation: No evidence of DVT seen on physical exam. Negative Homan's sign. No cords or calf tenderness. No  significant calf/ankle edema. Labs: Lab Results  Component Value Date   WBC 8.1 04/06/2018   HGB 8.3 (L) 04/06/2018   HCT 26.0 (L) 04/06/2018   MCV 72.2 (L) 04/06/2018   PLT 283 04/06/2018   CMP Latest Ref Rng & Units 04/06/2018  Glucose 70 - 99 mg/dL 89  BUN 6 - 20 mg/dL 8  Creatinine 1.300.44 - 8.651.00 mg/dL 7.840.50  Sodium 696135 - 295145 mmol/L 135  Potassium 3.5 - 5.1 mmol/L 3.5  Chloride 98 - 111 mmol/L 104  CO2 22 - 32 mmol/L 19(L)  Calcium 8.9 - 10.3 mg/dL 2.8(U8.8(L)  Total Protein 6.5 - 8.1 g/dL 6.7  Total Bilirubin 0.3 - 1.2 mg/dL 0.4  Alkaline Phos 38 - 126 U/L 76  AST 15 - 41 U/L 15  ALT 0 - 44 U/L 8    Discharge instruction: per After Visit Summary and "Baby and Me Booklet".  After visit meds:  Allergies as of 04/08/2018      Reactions   Bee Pollen Anaphylaxis      Medication List    STOP taking these medications   acetaminophen 500 MG tablet Commonly known as:  TYLENOL   amoxicillin-clavulanate 875-125 MG tablet Commonly known as:  AUGMENTIN   aspirin EC 81 MG tablet   metoCLOPramide 10 MG tablet Commonly known as:  REGLAN     TAKE these medications   butalbital-acetaminophen-caffeine 50-325-40 MG tablet Commonly known as:  FIORICET, ESGIC Take 1 tablet by mouth every 4 (four) hours as needed for headache.   ibuprofen 600 MG tablet Commonly known as:  ADVIL,MOTRIN Take 1 tablet (600 mg total) by mouth every 6 (six) hours.   prenatal multivitamin Tabs tablet Take 1 tablet by mouth daily at 12 noon.       Diet: routine diet  Activity: Advance as tolerated. Pelvic rest for 6 weeks.   Outpatient follow up:6 weeks Follow up Appt: Future Appointments  Date Time Provider Department Center  05/04/2018  1:30 PM Constant, Peggy, MD [facility] None   Follow up Visit:baby love for BP check 1 week  Postpartum contraception: Depo Provera  Newborn Data: Live born female  Birth Weight: 6 lb 7 oz (2920 g) APGAR: 9, 9  Newborn Delivery   Birth date/time:  04/06/2018  02:42:00 Delivery type:  VBAC, Spontaneous     Baby Feeding: Breast Disposition:home with mother   04/08/2018 Donette Larry, CNM

## 2018-04-08 NOTE — Progress Notes (Signed)
Pt complaining of random onset edema and tenderness in LLE.Pt states "below knee felt numb and above knee hurts". Assessed and noted left leg felt slightly  tighter then right but no edema present.  Assessed one beat of of clonus in left leg but when reassessed no clonus present. 1 plus reflexes. Pt also complaining of left sided headache, fioricet given to relieve. BP slightly elevated compared to pts normal. Made Dr Linwood Dibblesumball aware of conditon and came to bedside to assess. No new orders at this time. Will continue to monitor pt condition.

## 2018-04-10 ENCOUNTER — Telehealth: Payer: Self-pay | Admitting: General Practice

## 2018-04-10 NOTE — Telephone Encounter (Signed)
[facility] patient called and left message on nurse voicemail line stating she was recently discharged home from the hospital after giving birth. Patient states she is having horrible pelvic pain making it difficult to even walk. Patient requests call back.

## 2018-04-11 ENCOUNTER — Encounter: Payer: Medicaid Other | Admitting: Obstetrics and Gynecology

## 2018-04-12 ENCOUNTER — Encounter (HOSPITAL_COMMUNITY): Payer: Medicaid Other

## 2018-04-12 ENCOUNTER — Inpatient Hospital Stay (HOSPITAL_COMMUNITY): Admission: RE | Admit: 2018-04-12 | Payer: Medicaid Other | Source: Ambulatory Visit

## 2018-04-13 NOTE — Telephone Encounter (Signed)
Message sent to Dr for advice.

## 2018-05-04 ENCOUNTER — Ambulatory Visit (INDEPENDENT_AMBULATORY_CARE_PROVIDER_SITE_OTHER): Payer: Medicaid Other | Admitting: Obstetrics and Gynecology

## 2018-05-04 ENCOUNTER — Encounter: Payer: Self-pay | Admitting: Obstetrics and Gynecology

## 2018-05-04 ENCOUNTER — Encounter: Payer: Self-pay | Admitting: *Deleted

## 2018-05-04 DIAGNOSIS — Z3202 Encounter for pregnancy test, result negative: Secondary | ICD-10-CM

## 2018-05-04 DIAGNOSIS — Z1389 Encounter for screening for other disorder: Secondary | ICD-10-CM

## 2018-05-04 DIAGNOSIS — Z3042 Encounter for surveillance of injectable contraceptive: Secondary | ICD-10-CM | POA: Diagnosis not present

## 2018-05-04 LAB — POCT URINE PREGNANCY: Preg Test, Ur: NEGATIVE

## 2018-05-04 MED ORDER — MEDROXYPROGESTERONE ACETATE 150 MG/ML IM SUSP: 150.0000 mg | INTRAMUSCULAR | 5 refills | Status: DC

## 2018-05-04 MED ORDER — MEDROXYPROGESTERONE ACETATE 150 MG/ML IM SUSP
150.0000 mg | Freq: Once | INTRAMUSCULAR | Status: AC
  Administered 2018-05-04: 150 mg via INTRAMUSCULAR

## 2018-05-04 NOTE — Progress Notes (Signed)
Post Partum Exam  Kristine Perez is a 27 y.o. 502P2002 female who presents for a postpartum visit. She is 4 weeks postpartum following a spontaneous vaginal delivery. I have fully reviewed the prenatal and intrapartum course. The delivery was at 7377w1d gestational weeks.  Anesthesia: none. Postpartum course has been unremarkable . Baby's course has been unremarkable. Baby is feeding by breast. Bleeding no bleeding. Bowel function is normal. Bladder function is normal. Patient is not sexually active. Contraception method is none. Postpartum depression screening:neg    Last pap smear done 10/2017 and was Abnormal- LGSIL with normal colposcopy  Review of Systems Pertinent items are noted in HPI.    Objective:  unknown if currently breastfeeding.  General:  alert, cooperative and no distress   Breasts:  inspection negative, no nipple discharge or bleeding, no masses or nodularity palpable  Lungs: clear to auscultation bilaterally  Heart:  regular rate and rhythm  Abdomen: soft, non-tender; bowel sounds normal; no masses,  no organomegaly   Vulva:  normal  Vagina: normal vagina, no discharge, exudate, lesion, or erythema  Cervix:  multiparous appearance  Corpus: normal size, contour, position, consistency, mobility, non-tender  Adnexa:  normal adnexa and no mass, fullness, tenderness  Rectal Exam: Not performed.        Assessment:    Normal postpartum exam. Pap smear not done at today's visit.   Plan:   1. Contraception: Depo-Provera injections. Rx provided. Patient to return for admnistration 2. Patient is medically cleared to resume all activities of daily living 3. Follow up in: 6 months for annual exam or as needed.

## 2018-05-15 ENCOUNTER — Encounter: Payer: Self-pay | Admitting: *Deleted

## 2018-07-03 ENCOUNTER — Telehealth: Payer: Self-pay

## 2018-07-03 MED ORDER — NYSTATIN 100000 UNIT/GM EX CREA: 1.0000 "application " | TOPICAL_CREAM | Freq: Two times a day (BID) | CUTANEOUS | 1 refills | Status: DC

## 2018-07-03 NOTE — Telephone Encounter (Signed)
Pt called stating that her baby is being treated for thrush. She is requesting a rx for Nystatin. Please review for RX

## 2018-07-03 NOTE — Addendum Note (Signed)
Addended by: Catalina Antigua on: 07/03/2018 10:54 AM   Modules accepted: Orders

## 2018-07-05 NOTE — Telephone Encounter (Signed)
Pt informed

## 2018-07-25 ENCOUNTER — Ambulatory Visit: Payer: Medicaid Other

## 2018-07-28 ENCOUNTER — Ambulatory Visit (INDEPENDENT_AMBULATORY_CARE_PROVIDER_SITE_OTHER): Payer: Medicaid Other | Admitting: *Deleted

## 2018-07-28 VITALS — BP 125/82 | HR 71 | Wt 222.0 lb

## 2018-07-28 DIAGNOSIS — Z3042 Encounter for surveillance of injectable contraceptive: Secondary | ICD-10-CM | POA: Diagnosis not present

## 2018-07-28 MED ORDER — MEDROXYPROGESTERONE ACETATE 150 MG/ML IM SUSP
150.0000 mg | Freq: Once | INTRAMUSCULAR | Status: AC
  Administered 2018-07-28: 150 mg via INTRAMUSCULAR

## 2018-07-28 NOTE — Progress Notes (Signed)
Pt is in office for depo injection.  Pt is on time for injection.  Pt was given Depo from office supply today. Pt was unaware that she had Rx at pharmacy.  Pt advised to RTO Jan 10-24 for next depo. Pt made aware to bring Depo to that appt.  Pt has no other concerns today.  BP 125/82   Pulse 71   Wt 222 lb (100.7 kg)   BMI 35.83 kg/m   Administrations This Visit    medroxyPROGESTERone (DEPO-PROVERA) injection 150 mg    Admin Date 07/28/2018 Action Given Dose 150 mg Route Intramuscular Administered By Lanney Gins, CMA

## 2018-07-28 NOTE — Progress Notes (Signed)
Patient seen and assessed by nursing staff.  Agree with documentation and plan.  

## 2018-09-04 ENCOUNTER — Emergency Department (HOSPITAL_COMMUNITY)
Admission: EM | Admit: 2018-09-04 | Discharge: 2018-09-05 | Disposition: A | Discharge status: Home / Self Care | Payer: Medicaid Other | Attending: Emergency Medicine | Admitting: Emergency Medicine

## 2018-09-04 ENCOUNTER — Other Ambulatory Visit: Payer: Self-pay

## 2018-09-04 ENCOUNTER — Encounter (HOSPITAL_COMMUNITY): Payer: Self-pay | Admitting: *Deleted

## 2018-09-04 DIAGNOSIS — J111 Influenza due to unidentified influenza virus with other respiratory manifestations: Secondary | ICD-10-CM | POA: Insufficient documentation

## 2018-09-04 DIAGNOSIS — H9209 Otalgia, unspecified ear: Secondary | ICD-10-CM | POA: Insufficient documentation

## 2018-09-04 DIAGNOSIS — R51 Headache: Secondary | ICD-10-CM | POA: Insufficient documentation

## 2018-09-04 DIAGNOSIS — R509 Fever, unspecified: Secondary | ICD-10-CM | POA: Insufficient documentation

## 2018-09-04 DIAGNOSIS — R69 Illness, unspecified: Secondary | ICD-10-CM

## 2018-09-04 NOTE — ED Triage Notes (Signed)
Pt says since Friday she has had body aches, sore throat, ear pain, headache and fevers. Last tylenol about 2200 tonight.

## 2018-09-05 LAB — INFLUENZA PANEL BY PCR (TYPE A & B)
Influenza A By PCR: NEGATIVE
Influenza B By PCR: NEGATIVE

## 2018-09-05 NOTE — ED Notes (Signed)
Patient verbalizes understanding of discharge instructions. Opportunity for questioning and answers were provided. Armband removed by staff, pt discharged from ED ambulatory.   

## 2018-09-05 NOTE — Discharge Instructions (Addendum)
Contact a health care provider if: °· You develop new symptoms. °· You have: °? Chest pain. °? Diarrhea. °? A fever. °· Your cough gets worse. °· You produce more mucus. °· You feel nauseous or you vomit. °Get help right away if: °· You develop shortness of breath or difficulty breathing. °· Your skin or nails turn a bluish color. °· You have severe pain or stiffness in your neck. °· You develop a sudden headache or sudden pain in your face or ear. °· You cannot stop vomiting. ° °
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
## 2018-10-16 ENCOUNTER — Ambulatory Visit: Payer: Medicaid Other

## 2018-10-23 ENCOUNTER — Encounter: Payer: Self-pay | Admitting: Obstetrics

## 2018-10-23 ENCOUNTER — Ambulatory Visit (INDEPENDENT_AMBULATORY_CARE_PROVIDER_SITE_OTHER): Payer: Medicaid Other

## 2018-10-23 DIAGNOSIS — Z3042 Encounter for surveillance of injectable contraceptive: Secondary | ICD-10-CM

## 2018-10-23 MED ORDER — MEDROXYPROGESTERONE ACETATE 150 MG/ML IM SUSP
150.0000 mg | Freq: Once | INTRAMUSCULAR | Status: AC
  Administered 2018-10-23: 150 mg via INTRAMUSCULAR

## 2018-10-23 NOTE — Progress Notes (Signed)
Nurse visit for pt supply Depo. Pt on time for inj. Depo given R Del w/o complaints. Next Depo 4/7-4/21, pt agrees.

## 2019-01-10 ENCOUNTER — Ambulatory Visit: Payer: Medicaid Other

## 2019-01-17 ENCOUNTER — Ambulatory Visit: Payer: Medicaid Other

## 2019-02-06 ENCOUNTER — Telehealth: Payer: Self-pay

## 2019-02-06 ENCOUNTER — Encounter: Payer: Self-pay | Admitting: Obstetrics

## 2019-11-20 ENCOUNTER — Emergency Department (HOSPITAL_COMMUNITY)
Admission: EM | Admit: 2019-11-20 | Discharge: 2019-11-20 | Disposition: A | Discharge status: Home / Self Care | Payer: BC Managed Care – PPO | Attending: Emergency Medicine | Admitting: Emergency Medicine

## 2019-11-20 ENCOUNTER — Other Ambulatory Visit: Payer: Self-pay

## 2019-11-20 ENCOUNTER — Encounter (HOSPITAL_COMMUNITY): Payer: Self-pay | Admitting: Emergency Medicine

## 2019-11-20 DIAGNOSIS — R42 Dizziness and giddiness: Secondary | ICD-10-CM | POA: Insufficient documentation

## 2019-11-20 DIAGNOSIS — G43909 Migraine, unspecified, not intractable, without status migrainosus: Secondary | ICD-10-CM | POA: Insufficient documentation

## 2019-11-20 DIAGNOSIS — Y9389 Activity, other specified: Secondary | ICD-10-CM | POA: Insufficient documentation

## 2019-11-20 DIAGNOSIS — R519 Headache, unspecified: Secondary | ICD-10-CM | POA: Diagnosis present

## 2019-11-20 DIAGNOSIS — H53149 Visual discomfort, unspecified: Secondary | ICD-10-CM | POA: Diagnosis not present

## 2019-11-20 DIAGNOSIS — Y998 Other external cause status: Secondary | ICD-10-CM | POA: Insufficient documentation

## 2019-11-20 DIAGNOSIS — Y92410 Unspecified street and highway as the place of occurrence of the external cause: Secondary | ICD-10-CM | POA: Insufficient documentation

## 2019-11-20 MED ORDER — DIPHENHYDRAMINE HCL 50 MG/ML IJ SOLN
12.5000 mg | Freq: Once | INTRAMUSCULAR | Status: AC
  Administered 2019-11-20: 22:00:00 12.5 mg via INTRAVENOUS
  Filled 2019-11-20: qty 1

## 2019-11-20 MED ORDER — BUTALBITAL-APAP-CAFFEINE 50-325-40 MG PO TABS: 1.0000 | ORAL_TABLET | Freq: Three times a day (TID) | ORAL | 0 refills | Status: DC | PRN

## 2019-11-20 MED ORDER — METHOCARBAMOL 500 MG PO TABS: 500.0000 mg | ORAL_TABLET | Freq: Two times a day (BID) | ORAL | 0 refills | Status: DC | PRN

## 2019-11-20 MED ORDER — METOCLOPRAMIDE HCL 10 MG PO TABS: 10.0000 mg | ORAL_TABLET | Freq: Three times a day (TID) | ORAL | 0 refills | Status: DC | PRN

## 2019-11-20 MED ORDER — KETOROLAC TROMETHAMINE 30 MG/ML IJ SOLN
15.0000 mg | Freq: Once | INTRAMUSCULAR | Status: AC
  Administered 2019-11-20: 22:00:00 15 mg via INTRAVENOUS
  Filled 2019-11-20: qty 1

## 2019-11-20 MED ORDER — METOCLOPRAMIDE HCL 5 MG/ML IJ SOLN
10.0000 mg | INTRAMUSCULAR | Status: AC
  Administered 2019-11-20: 10 mg via INTRAVENOUS
  Filled 2019-11-20: qty 2
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
## 2020-03-25 ENCOUNTER — Other Ambulatory Visit: Payer: Self-pay

## 2020-03-25 ENCOUNTER — Other Ambulatory Visit (HOSPITAL_COMMUNITY)
Admission: RE | Admit: 2020-03-25 | Discharge: 2020-03-25 | Disposition: A | Discharge status: Home / Self Care | Payer: BC Managed Care – PPO | Source: Ambulatory Visit | Attending: Obstetrics | Admitting: Obstetrics

## 2020-03-25 ENCOUNTER — Ambulatory Visit: Payer: Medicaid Other | Admitting: Obstetrics

## 2020-03-25 ENCOUNTER — Encounter: Payer: Self-pay | Admitting: Obstetrics

## 2020-03-25 VITALS — BP 114/77 | HR 74 | Ht 67.0 in | Wt 257.0 lb

## 2020-03-25 DIAGNOSIS — Z113 Encounter for screening for infections with a predominantly sexual mode of transmission: Secondary | ICD-10-CM | POA: Diagnosis not present

## 2020-03-25 DIAGNOSIS — Z6841 Body Mass Index (BMI) 40.0 and over, adult: Secondary | ICD-10-CM

## 2020-03-25 DIAGNOSIS — Z01419 Encounter for gynecological examination (general) (routine) without abnormal findings: Secondary | ICD-10-CM

## 2020-03-25 DIAGNOSIS — L0292 Furuncle, unspecified: Secondary | ICD-10-CM

## 2020-03-25 DIAGNOSIS — N898 Other specified noninflammatory disorders of vagina: Secondary | ICD-10-CM | POA: Diagnosis present

## 2020-03-25 MED ORDER — CLINDAMYCIN PHOSPHATE 1 % EX LOTN: TOPICAL_LOTION | Freq: Two times a day (BID) | CUTANEOUS | 2 refills | Status: DC
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
## 2020-03-26 LAB — CERVICOVAGINAL ANCILLARY ONLY
Bacterial Vaginitis (gardnerella): POSITIVE — AB
Candida Glabrata: NEGATIVE
Candida Vaginitis: NEGATIVE
Chlamydia: POSITIVE — AB
Comment: NEGATIVE
Comment: NEGATIVE
Comment: NEGATIVE
Comment: NEGATIVE
Comment: NEGATIVE
Comment: NORMAL
Neisseria Gonorrhea: NEGATIVE
Trichomonas: NEGATIVE

## 2020-03-27 ENCOUNTER — Other Ambulatory Visit: Payer: Self-pay | Admitting: Obstetrics

## 2020-03-27 DIAGNOSIS — N760 Acute vaginitis: Secondary | ICD-10-CM

## 2020-03-27 DIAGNOSIS — A5609 Other chlamydial infection of lower genitourinary tract: Secondary | ICD-10-CM

## 2020-03-27 LAB — HEPB+HEPC+HIV PANEL
HIV Screen 4th Generation wRfx: NONREACTIVE
Hep B C IgM: NEGATIVE
Hep B Core Total Ab: NEGATIVE
Hep B E Ab: NEGATIVE
Hep B E Ag: NEGATIVE
Hep B Surface Ab, Qual: NONREACTIVE
Hep C Virus Ab: <0.1 s/co ratio (ref 0.0–0.9)
Hepatitis B Surface Ag: NEGATIVE

## 2020-03-27 LAB — RPR: RPR Ser Ql: NONREACTIVE

## 2020-03-27 MED ORDER — DOXYCYCLINE HYCLATE 100 MG PO CAPS: 100.0000 mg | ORAL_CAPSULE | Freq: Two times a day (BID) | ORAL | 0 refills | Status: DC

## 2020-03-27 MED ORDER — METRONIDAZOLE 500 MG PO TABS: 500.0000 mg | ORAL_TABLET | Freq: Two times a day (BID) | ORAL | 2 refills | Status: DC

## 2020-03-28 LAB — CYTOLOGY - PAP
Comment: NEGATIVE
Diagnosis: UNDETERMINED — AB
High risk HPV: POSITIVE — AB

## 2020-04-16 ENCOUNTER — Other Ambulatory Visit: Payer: Self-pay

## 2020-04-16 ENCOUNTER — Other Ambulatory Visit (HOSPITAL_COMMUNITY)
Admission: RE | Admit: 2020-04-16 | Discharge: 2020-04-16 | Disposition: A | Discharge status: Home / Self Care | Payer: BC Managed Care – PPO | Source: Ambulatory Visit | Attending: Obstetrics | Admitting: Obstetrics

## 2020-04-16 ENCOUNTER — Ambulatory Visit (INDEPENDENT_AMBULATORY_CARE_PROVIDER_SITE_OTHER): Payer: BC Managed Care – PPO | Admitting: Obstetrics

## 2020-04-16 ENCOUNTER — Encounter: Payer: Self-pay | Admitting: Obstetrics

## 2020-04-16 VITALS — BP 127/80 | HR 69 | Ht 67.0 in | Wt 258.0 lb

## 2020-04-16 DIAGNOSIS — R87810 Cervical high risk human papillomavirus (HPV) DNA test positive: Secondary | ICD-10-CM

## 2020-04-16 DIAGNOSIS — R87610 Atypical squamous cells of undetermined significance on cytologic smear of cervix (ASC-US): Secondary | ICD-10-CM | POA: Insufficient documentation

## 2020-04-16 DIAGNOSIS — Z3202 Encounter for pregnancy test, result negative: Secondary | ICD-10-CM

## 2020-04-16 DIAGNOSIS — N870 Mild cervical dysplasia: Secondary | ICD-10-CM | POA: Diagnosis not present

## 2020-04-16 DIAGNOSIS — N898 Other specified noninflammatory disorders of vagina: Secondary | ICD-10-CM

## 2020-04-16 LAB — POCT URINE PREGNANCY: Preg Test, Ur: NEGATIVE

## 2020-04-16 NOTE — Addendum Note (Signed)
Addended by: Brock Bad on: 04/16/2020 09:56 AM   Modules accepted: Orders

## 2020-04-16 NOTE — Progress Notes (Signed)
Colposcopy Procedure Note  Indications: Pap smear 1 months ago showed: ASCUS with POSITIVE high risk HPV. The prior pap showed low-grade squamous intraepithelial neoplasia (LGSIL - encompassing HPV,mild dysplasia,CIN I).  Prior cervical/vaginal disease: none. Prior cervical treatment: no treatment.  Procedure Details  The risks and benefits of the procedure and Written informed consent obtained.  A time-out was performed confirming the patient, procedure and allergy status  Speculum placed in vagina and excellent visualization of cervix achieved, cervix swabbed x 3 with acetic acid solution.  Findings: Cervix: no visible lesions, no mosaicism, no punctation and no abnormal vasculature; SCJ visualized 360 degrees without lesions, endocervical curettage performed, cervical biopsies taken at 6 and 12 o'clock, specimen labelled and sent to pathology and hemostasis achieved with silver nitrate.   Vaginal inspection: normal without visible lesions. Vulvar colposcopy: vulvar colposcopy not performed.   Physical Exam   Specimens: ECC and Cervical Biopsies  Complications: none.  Plan: Specimens labelled and sent to Pathology. Will base further treatment on Pathology findings. Treatment options discussed with patient. Post biopsy instructions given to patient. Return to discuss Pathology results in 2 weeks - MyChart  Brock Bad, MD 04/16/2020 9:51 AM.

## 2020-04-16 NOTE — Progress Notes (Signed)
Patient is in the office for colpo.Pt states she recently stopped breastfeeding and she has not had a cycle, denies being sexually active in the last 2 weeks. UPT is negative

## 2020-04-16 NOTE — Addendum Note (Signed)
Addended by: Natale Milch D on: 04/16/2020 09:54 AM   Modules accepted: Orders

## 2020-04-17 ENCOUNTER — Other Ambulatory Visit: Payer: Self-pay | Admitting: Obstetrics

## 2020-04-17 LAB — SURGICAL PATHOLOGY

## 2020-04-30 ENCOUNTER — Telehealth (INDEPENDENT_AMBULATORY_CARE_PROVIDER_SITE_OTHER): Payer: BC Managed Care – PPO | Admitting: Obstetrics

## 2020-04-30 ENCOUNTER — Encounter: Payer: Self-pay | Admitting: Obstetrics

## 2020-04-30 DIAGNOSIS — N870 Mild cervical dysplasia: Secondary | ICD-10-CM

## 2020-04-30 NOTE — Progress Notes (Signed)
Pt is on the phone preparing for virtual visit with provider.  
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
## 2020-12-16 ENCOUNTER — Ambulatory Visit: Payer: BC Managed Care – PPO | Admitting: Obstetrics

## 2020-12-25 ENCOUNTER — Other Ambulatory Visit: Payer: Self-pay

## 2020-12-25 ENCOUNTER — Ambulatory Visit (INDEPENDENT_AMBULATORY_CARE_PROVIDER_SITE_OTHER): Payer: BC Managed Care – PPO | Admitting: Obstetrics

## 2020-12-25 ENCOUNTER — Other Ambulatory Visit (HOSPITAL_COMMUNITY)
Admission: RE | Admit: 2020-12-25 | Discharge: 2020-12-25 | Disposition: A | Discharge status: Home / Self Care | Payer: BC Managed Care – PPO | Source: Ambulatory Visit | Attending: Obstetrics | Admitting: Obstetrics

## 2020-12-25 VITALS — BP 117/76 | HR 74 | Wt 260.0 lb

## 2020-12-25 DIAGNOSIS — R110 Nausea: Secondary | ICD-10-CM

## 2020-12-25 DIAGNOSIS — N898 Other specified noninflammatory disorders of vagina: Secondary | ICD-10-CM | POA: Insufficient documentation

## 2020-12-25 DIAGNOSIS — R102 Pelvic and perineal pain: Secondary | ICD-10-CM | POA: Insufficient documentation

## 2020-12-25 DIAGNOSIS — Z0000 Encounter for general adult medical examination without abnormal findings: Secondary | ICD-10-CM

## 2020-12-25 DIAGNOSIS — Z6841 Body Mass Index (BMI) 40.0 and over, adult: Secondary | ICD-10-CM

## 2020-12-25 DIAGNOSIS — R5383 Other fatigue: Secondary | ICD-10-CM | POA: Diagnosis not present

## 2020-12-25 DIAGNOSIS — N644 Mastodynia: Secondary | ICD-10-CM

## 2020-12-25 DIAGNOSIS — R140 Abdominal distension (gaseous): Secondary | ICD-10-CM | POA: Diagnosis not present

## 2020-12-25 DIAGNOSIS — D508 Other iron deficiency anemias: Secondary | ICD-10-CM

## 2020-12-25 NOTE — Addendum Note (Signed)
Addended by: Marya Landry D on: 12/25/2020 03:23 PM   Modules accepted: Orders

## 2020-12-25 NOTE — Progress Notes (Signed)
Pt was recently seen at Urgent care and treated for uti, was given Macrobid.  Pt states she was advised to f/u with GYN for abd pain and swelling.  Pt states she completed treatment but still having abd pain/cramping and breast pain.   Pt has had 2 negative upts.   Pt is not on any form of BC.
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
## 2020-12-26 LAB — CERVICOVAGINAL ANCILLARY ONLY
Bacterial Vaginitis (gardnerella): POSITIVE — AB
Candida Glabrata: NEGATIVE
Candida Vaginitis: NEGATIVE
Chlamydia: NEGATIVE
Comment: NEGATIVE
Comment: NEGATIVE
Comment: NEGATIVE
Comment: NEGATIVE
Comment: NEGATIVE
Comment: NORMAL
Neisseria Gonorrhea: NEGATIVE
Trichomonas: NEGATIVE

## 2020-12-26 LAB — COMPREHENSIVE METABOLIC PANEL
ALT: 14 IU/L (ref 0–32)
AST: 15 IU/L (ref 0–40)
Albumin/Globulin Ratio: 1.4 (ref 1.2–2.2)
Albumin: 4.2 g/dL (ref 3.9–5.0)
Alkaline Phosphatase: 42 IU/L — ABNORMAL LOW (ref 44–121)
BUN/Creatinine Ratio: 16 (ref 9–23)
BUN: 12 mg/dL (ref 6–20)
Bilirubin Total: 0.3 mg/dL (ref 0.0–1.2)
CO2: 21 mmol/L (ref 20–29)
Calcium: 9.4 mg/dL (ref 8.7–10.2)
Chloride: 102 mmol/L (ref 96–106)
Creatinine, Ser: 0.74 mg/dL (ref 0.57–1.00)
Globulin, Total: 3.1 g/dL (ref 1.5–4.5)
Glucose: 95 mg/dL (ref 65–99)
Potassium: 4.8 mmol/L (ref 3.5–5.2)
Sodium: 138 mmol/L (ref 134–144)
Total Protein: 7.3 g/dL (ref 6.0–8.5)
eGFR: 113 mL/min/{1.73_m2} (ref >59)

## 2020-12-26 LAB — CBC
Hematocrit: 36.3 % (ref 34.0–46.6)
Hemoglobin: 11.7 g/dL (ref 11.1–15.9)
MCH: 26.1 pg — ABNORMAL LOW (ref 26.6–33.0)
MCHC: 32.2 g/dL (ref 31.5–35.7)
MCV: 81 fL (ref 79–97)
Platelets: 350 10*3/uL (ref 150–450)
RBC: 4.48 x10E6/uL (ref 3.77–5.28)
RDW: 13.7 % (ref 11.7–15.4)
WBC: 5.8 10*3/uL (ref 3.4–10.8)

## 2020-12-26 LAB — FERRITIN: Ferritin: 38 ng/mL (ref 15–150)

## 2020-12-26 LAB — TSH: TSH: 1.34 u[IU]/mL (ref 0.450–4.500)

## 2020-12-26 LAB — HEMOGLOBIN A1C
Est. average glucose Bld gHb Est-mCnc: 114 mg/dL
Hgb A1c MFr Bld: 5.6 % (ref 4.8–5.6)

## 2020-12-27 ENCOUNTER — Other Ambulatory Visit: Payer: Self-pay | Admitting: Obstetrics

## 2020-12-27 DIAGNOSIS — B9689 Other specified bacterial agents as the cause of diseases classified elsewhere: Secondary | ICD-10-CM

## 2020-12-27 MED ORDER — METRONIDAZOLE 500 MG PO TABS: 500.0000 mg | ORAL_TABLET | Freq: Two times a day (BID) | ORAL | 2 refills | Status: DC

## 2021-01-08 ENCOUNTER — Telehealth: Payer: BC Managed Care – PPO | Admitting: Obstetrics

## 2021-01-12 ENCOUNTER — Encounter: Payer: Self-pay | Admitting: Obstetrics

## 2021-01-12 ENCOUNTER — Ambulatory Visit
Admission: RE | Admit: 2021-01-12 | Discharge: 2021-01-12 | Disposition: A | Discharge status: Home / Self Care | Payer: BC Managed Care – PPO | Source: Ambulatory Visit | Attending: Obstetrics | Admitting: Obstetrics

## 2021-01-12 ENCOUNTER — Telehealth (INDEPENDENT_AMBULATORY_CARE_PROVIDER_SITE_OTHER): Payer: BC Managed Care – PPO | Admitting: Obstetrics

## 2021-01-12 DIAGNOSIS — N644 Mastodynia: Secondary | ICD-10-CM | POA: Diagnosis not present

## 2021-01-12 DIAGNOSIS — R102 Pelvic and perineal pain: Secondary | ICD-10-CM

## 2021-01-12 DIAGNOSIS — Z6841 Body Mass Index (BMI) 40.0 and over, adult: Secondary | ICD-10-CM

## 2021-01-12 DIAGNOSIS — R140 Abdominal distension (gaseous): Secondary | ICD-10-CM
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
## 2021-02-11 ENCOUNTER — Telehealth: Payer: BC Managed Care – PPO | Admitting: Obstetrics

## 2021-06-15 ENCOUNTER — Ambulatory Visit: Payer: BC Managed Care – PPO | Admitting: Internal Medicine

## 2021-09-02 ENCOUNTER — Ambulatory Visit: Payer: BC Managed Care – PPO | Admitting: Internal Medicine

## 2021-09-02 ENCOUNTER — Other Ambulatory Visit: Payer: Self-pay

## 2021-10-14 ENCOUNTER — Ambulatory Visit: Payer: BC Managed Care – PPO | Admitting: Internal Medicine

## 2021-10-21 ENCOUNTER — Encounter: Payer: Self-pay | Admitting: Internal Medicine

## 2021-10-21 ENCOUNTER — Ambulatory Visit (INDEPENDENT_AMBULATORY_CARE_PROVIDER_SITE_OTHER): Payer: BC Managed Care – PPO | Admitting: Internal Medicine

## 2021-10-21 ENCOUNTER — Other Ambulatory Visit: Payer: Self-pay

## 2021-10-21 VITALS — BP 124/80 | HR 90 | Resp 18 | Ht 67.0 in | Wt 269.6 lb

## 2021-10-21 DIAGNOSIS — R140 Abdominal distension (gaseous): Secondary | ICD-10-CM | POA: Diagnosis not present

## 2021-10-21 DIAGNOSIS — L0292 Furuncle, unspecified: Secondary | ICD-10-CM | POA: Diagnosis not present

## 2021-10-21 DIAGNOSIS — G43109 Migraine with aura, not intractable, without status migrainosus: Secondary | ICD-10-CM | POA: Diagnosis not present

## 2021-10-21 LAB — COMPREHENSIVE METABOLIC PANEL
ALT: 12 U/L (ref 0–35)
AST: 11 U/L (ref 0–37)
Albumin: 3.9 g/dL (ref 3.5–5.2)
Alkaline Phosphatase: 33 U/L — ABNORMAL LOW (ref 39–117)
BUN: 11 mg/dL (ref 6–23)
CO2: 29 mEq/L (ref 19–32)
Calcium: 8.7 mg/dL (ref 8.4–10.5)
Chloride: 105 mEq/L (ref 96–112)
Creatinine, Ser: 0.68 mg/dL (ref 0.40–1.20)
GFR: 118.1 mL/min (ref >60.00)
Glucose, Bld: 95 mg/dL (ref 70–99)
Potassium: 3.8 mEq/L (ref 3.5–5.1)
Sodium: 138 mEq/L (ref 135–145)
Total Bilirubin: 0.3 mg/dL (ref 0.2–1.2)
Total Protein: 7.2 g/dL (ref 6.0–8.3)

## 2021-10-21 LAB — LIPID PANEL
Cholesterol: 148 mg/dL (ref 0–200)
HDL: 38.2 mg/dL — ABNORMAL LOW (ref >39.00)
LDL Cholesterol: 91 mg/dL (ref 0–99)
NonHDL: 109.44
Total CHOL/HDL Ratio: 4
Triglycerides: 91 mg/dL (ref 0.0–149.0)
VLDL: 18.2 mg/dL (ref 0.0–40.0)

## 2021-10-21 LAB — VITAMIN B12: Vitamin B-12: 263 pg/mL (ref 211–911)

## 2021-10-21 LAB — VITAMIN D 25 HYDROXY (VIT D DEFICIENCY, FRACTURES): VITD: 13.87 ng/mL — ABNORMAL LOW (ref 30.00–100.00)

## 2021-10-21 LAB — CBC
HCT: 32.3 % — ABNORMAL LOW (ref 36.0–46.0)
Hemoglobin: 10.7 g/dL — ABNORMAL LOW (ref 12.0–15.0)
MCHC: 33.1 g/dL (ref 30.0–36.0)
MCV: 78.7 fl (ref 78.0–100.0)
Platelets: 315 10*3/uL (ref 150.0–400.0)
RBC: 4.1 Mil/uL (ref 3.87–5.11)
RDW: 15.1 % (ref 11.5–15.5)
WBC: 5.8 10*3/uL (ref 4.0–10.5)

## 2021-10-21 LAB — FERRITIN: Ferritin: 8.1 ng/mL — ABNORMAL LOW (ref 10.0–291.0)

## 2021-10-21 LAB — HEMOGLOBIN A1C: Hgb A1c MFr Bld: 5.9 % (ref 4.6–6.5)

## 2021-10-21 LAB — T4, FREE: Free T4: 0.83 ng/dL (ref 0.60–1.60)

## 2021-10-21 LAB — TSH: TSH: 1.06 u[IU]/mL (ref 0.35–5.50)

## 2021-10-21 MED ORDER — SUMATRIPTAN SUCCINATE 50 MG PO TABS: 50.0000 mg | ORAL_TABLET | ORAL | 0 refills | Status: DC | PRN

## 2021-10-21 MED ORDER — CLINDAMYCIN PHOSPHATE 1 % EX LOTN: TOPICAL_LOTION | Freq: Two times a day (BID) | CUTANEOUS | 11 refills | Status: DC

## 2021-10-21 MED ORDER — AIMOVIG 70 MG/ML ~~LOC~~ SOAJ: 70.0000 mg | SUBCUTANEOUS | 5 refills | Status: DC

## 2021-10-21 NOTE — Patient Instructions (Addendum)
We will check the labs today,  We have sent in the pen for the migraines to do once a month.   We have refilled the migraine medicine also and the cream.   We will get the ultrasound of the stomach.

## 2021-10-21 NOTE — Progress Notes (Signed)
° °  Subjective:   Patient ID: Kristine Perez, female    DOB: Sep 12, 1991, 31 y.o.   MRN: [id number]  HPI The patient is a new 31 YO female coming in for concerns.  PMH, [facility], social history reviewed and updated  Review of Systems  Constitutional:  Positive for appetite change.  HENT: Negative.    Eyes: Negative.   Respiratory:  Negative for cough, chest tightness and shortness of breath.   Cardiovascular:  Negative for chest pain, palpitations and leg swelling.  Gastrointestinal:  Positive for abdominal distention and abdominal pain. Negative for anal bleeding, blood in stool, constipation, diarrhea, nausea and vomiting.  Musculoskeletal: Negative.   Skin:  Positive for rash.  Neurological:  Positive for headaches.  Psychiatric/Behavioral: Negative.     Objective:  Physical Exam Constitutional:      Appearance: She is well-developed. She is obese.  HENT:     Head: Normocephalic and atraumatic.  Cardiovascular:     Rate and Rhythm: Normal rate and regular rhythm.  Pulmonary:     Effort: Pulmonary effort is normal. No respiratory distress.     Breath sounds: Normal breath sounds. No wheezing or rales.  Abdominal:     General: Bowel sounds are normal. There is distension.     Palpations: Abdomen is soft.     Tenderness: There is no abdominal tenderness. There is no rebound.  Musculoskeletal:     Cervical back: Normal range of motion.  Skin:    General: Skin is warm and dry.  Neurological:     Mental Status: She is alert and oriented to person, place, and time.     Coordination: Coordination normal.    Vitals:   10/21/21 1014  BP: 124/80  Pulse: 90  Resp: 18  SpO2: 95%  Weight: 269 lb 9.6 oz (122.3 kg)  Height: 5\' 7"  (1.702 m)    This visit occurred during the SARS-CoV-2 public health emergency.  Safety protocols were in place, including screening questions prior to the visit, additional usage of staff PPE, and extensive cleaning of exam room while observing  appropriate contact time as indicated for disinfecting solutions.   Assessment & Plan:

## 2021-10-23 DIAGNOSIS — G43909 Migraine, unspecified, not intractable, without status migrainosus: Secondary | ICD-10-CM | POA: Insufficient documentation

## 2021-10-23 DIAGNOSIS — L0292 Furuncle, unspecified: Secondary | ICD-10-CM | POA: Insufficient documentation

## 2021-10-23 DIAGNOSIS — R140 Abdominal distension (gaseous): Secondary | ICD-10-CM | POA: Insufficient documentation

## 2021-10-23 NOTE — Assessment & Plan Note (Signed)
Uses clindamycin for preventative and treatment. Rx done today.

## 2021-10-23 NOTE — Assessment & Plan Note (Signed)
Unclear etiology and pelvis US and exam without etiology. She does admit to constipation chronically which could contribute. Checking CBC, CMP, vitamin D and B12 and TSH and US abdomen.

## 2021-10-23 NOTE — Assessment & Plan Note (Signed)
Checking lipid panel and HgA1c for screening.

## 2021-10-23 NOTE — Assessment & Plan Note (Signed)
Very frequent migraines and desires preventative treatment. Rx aimovig 70 mg monthly. Rx sumatriptan for abortive. Follow up in 1-2 months with effectiveness and can increase dose aimovig if needed.

## 2021-10-26 ENCOUNTER — Ambulatory Visit
Admission: RE | Admit: 2021-10-26 | Discharge: 2021-10-26 | Disposition: A | Discharge status: Home / Self Care | Payer: BC Managed Care – PPO | Source: Ambulatory Visit | Attending: Internal Medicine | Admitting: Internal Medicine

## 2021-10-26 ENCOUNTER — Other Ambulatory Visit: Payer: Self-pay | Admitting: Internal Medicine

## 2021-10-26 DIAGNOSIS — R140 Abdominal distension (gaseous): Secondary | ICD-10-CM

## 2021-10-26 MED ORDER — VITAMIN D (ERGOCALCIFEROL) 1.25 MG (50000 UNIT) PO CAPS: 50000.0000 [IU] | ORAL_CAPSULE | ORAL | 0 refills | Status: DC

## 2021-11-25 ENCOUNTER — Ambulatory Visit: Payer: BC Managed Care – PPO | Admitting: Internal Medicine

## 2022-01-21 ENCOUNTER — Ambulatory Visit: Payer: BC Managed Care – PPO | Admitting: Internal Medicine

## 2022-01-25 ENCOUNTER — Ambulatory Visit (INDEPENDENT_AMBULATORY_CARE_PROVIDER_SITE_OTHER): Payer: BC Managed Care – PPO | Admitting: Internal Medicine

## 2022-01-25 ENCOUNTER — Encounter: Payer: Self-pay | Admitting: Internal Medicine

## 2022-01-25 VITALS — BP 126/70 | HR 80 | Resp 18 | Ht 67.0 in | Wt 279.0 lb

## 2022-01-25 DIAGNOSIS — R1084 Generalized abdominal pain: Secondary | ICD-10-CM

## 2022-01-25 DIAGNOSIS — E559 Vitamin D deficiency, unspecified: Secondary | ICD-10-CM | POA: Diagnosis not present

## 2022-01-25 DIAGNOSIS — R7303 Prediabetes: Secondary | ICD-10-CM

## 2022-01-25 DIAGNOSIS — M542 Cervicalgia: Secondary | ICD-10-CM

## 2022-01-25 DIAGNOSIS — M5441 Lumbago with sciatica, right side: Secondary | ICD-10-CM

## 2022-01-25 DIAGNOSIS — D509 Iron deficiency anemia, unspecified: Secondary | ICD-10-CM | POA: Insufficient documentation

## 2022-01-25 DIAGNOSIS — G43109 Migraine with aura, not intractable, without status migrainosus: Secondary | ICD-10-CM

## 2022-01-25 MED ORDER — TIZANIDINE HCL 4 MG PO TABS: 4.0000 mg | ORAL_TABLET | Freq: Four times a day (QID) | ORAL | 0 refills | Status: DC | PRN
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
## 2022-01-25 NOTE — Assessment & Plan Note (Signed)
Checking CBC to follow up low Hg from prior and treat as appropriate.  ?

## 2022-01-25 NOTE — Assessment & Plan Note (Signed)
Rechecking HgA1c since we are doing labs today for monitoring. ?
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
## 2022-01-25 NOTE — Assessment & Plan Note (Signed)
Checking vitamin D level as she has completed high dose replacement. No change in symptoms. Adjust as needed.  ?

## 2022-01-25 NOTE — Assessment & Plan Note (Signed)
Refer to plastic surgery. It is the opinion of her ob/gyn that she would benefit from this with pain.  ?

## 2022-01-25 NOTE — Patient Instructions (Signed)
We will check the labs and check on the migraine injection. ? ?We will get you in with physical therapy and have sent in the muscle relaxer tizanidine. ? ? ?

## 2022-02-03 ENCOUNTER — Other Ambulatory Visit: Payer: Self-pay | Admitting: Internal Medicine

## 2022-02-09 ENCOUNTER — Ambulatory Visit: Payer: BC Managed Care – PPO | Attending: Internal Medicine

## 2022-02-09 DIAGNOSIS — M5441 Lumbago with sciatica, right side: Secondary | ICD-10-CM | POA: Insufficient documentation

## 2022-02-09 DIAGNOSIS — M542 Cervicalgia: Secondary | ICD-10-CM | POA: Insufficient documentation

## 2022-02-09 DIAGNOSIS — R252 Cramp and spasm: Secondary | ICD-10-CM | POA: Insufficient documentation

## 2022-02-09 DIAGNOSIS — M5442 Lumbago with sciatica, left side: Secondary | ICD-10-CM | POA: Insufficient documentation
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
## 2022-02-15 ENCOUNTER — Other Ambulatory Visit: Payer: Self-pay | Admitting: Internal Medicine

## 2022-02-15 ENCOUNTER — Ambulatory Visit (INDEPENDENT_AMBULATORY_CARE_PROVIDER_SITE_OTHER): Payer: BC Managed Care – PPO | Admitting: Plastic Surgery

## 2022-02-15 ENCOUNTER — Encounter: Payer: Self-pay | Admitting: Plastic Surgery

## 2022-02-15 VITALS — BP 124/71 | HR 79 | Ht 67.0 in | Wt 280.0 lb

## 2022-02-15 DIAGNOSIS — M549 Dorsalgia, unspecified: Secondary | ICD-10-CM

## 2022-02-15 DIAGNOSIS — N62 Hypertrophy of breast: Secondary | ICD-10-CM

## 2022-02-15 DIAGNOSIS — R21 Rash and other nonspecific skin eruption: Secondary | ICD-10-CM

## 2022-02-15 DIAGNOSIS — M793 Panniculitis, unspecified: Secondary | ICD-10-CM

## 2022-02-16 ENCOUNTER — Ambulatory Visit: Payer: BC Managed Care – PPO
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
## 2022-02-19 ENCOUNTER — Ambulatory Visit: Payer: BC Managed Care – PPO | Admitting: Physical Therapy

## 2022-02-23 ENCOUNTER — Ambulatory Visit: Payer: BC Managed Care – PPO | Admitting: Physical Therapy
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
## 2022-03-02 ENCOUNTER — Ambulatory Visit: Payer: BC Managed Care – PPO

## 2022-03-02 ENCOUNTER — Telehealth: Payer: Self-pay

## 2022-03-02 NOTE — Telephone Encounter (Signed)
Left message in My Chart re: no show appt for 5/30. Pt was advised of the attendance policy and of her next xcheduled appt.

## 2022-03-04 ENCOUNTER — Ambulatory Visit: Payer: BC Managed Care – PPO | Admitting: Physical Therapy

## 2022-03-05 ENCOUNTER — Telehealth: Payer: Self-pay

## 2022-03-05 NOTE — Telephone Encounter (Signed)
LMVM, insurance denied authorization for panniculectomy due to no medical management of rash for 3 months without improvement. May try going to a provider for 3 months with documentation no improvement with different medications/oinments/creams/powders. Offered  cosmetic quote if interested.

## 2022-03-09 ENCOUNTER — Ambulatory Visit: Payer: BC Managed Care – PPO | Attending: Internal Medicine
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
## 2022-03-11 ENCOUNTER — Ambulatory Visit: Payer: BC Managed Care – PPO

## 2022-03-16 ENCOUNTER — Telehealth: Payer: Self-pay

## 2022-03-16 ENCOUNTER — Ambulatory Visit: Payer: BC Managed Care – PPO
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
## 2022-03-16 NOTE — Telephone Encounter (Signed)
LVM re: 3rd no show appt. Pt was advised of clinic policy of discharge following the 3rd no show appt. Pt was advised she can receive PT again with a new referral from a MD.

## 2022-04-19 ENCOUNTER — Encounter: Payer: Self-pay | Admitting: Internal Medicine

## 2022-04-19 ENCOUNTER — Ambulatory Visit (INDEPENDENT_AMBULATORY_CARE_PROVIDER_SITE_OTHER): Payer: BC Managed Care – PPO | Admitting: Internal Medicine

## 2022-04-19 ENCOUNTER — Telehealth: Payer: Self-pay | Admitting: *Deleted

## 2022-04-19 VITALS — BP 126/72 | HR 78 | Resp 18 | Ht 67.0 in | Wt 278.6 lb

## 2022-04-19 DIAGNOSIS — M793 Panniculitis, unspecified: Secondary | ICD-10-CM

## 2022-04-19 DIAGNOSIS — M546 Pain in thoracic spine: Secondary | ICD-10-CM | POA: Diagnosis not present

## 2022-04-19 DIAGNOSIS — L709 Acne, unspecified: Secondary | ICD-10-CM

## 2022-04-19 DIAGNOSIS — M542 Cervicalgia: Secondary | ICD-10-CM

## 2022-04-19 DIAGNOSIS — G43109 Migraine with aura, not intractable, without status migrainosus: Secondary | ICD-10-CM

## 2022-04-19 DIAGNOSIS — M5441 Lumbago with sciatica, right side: Secondary | ICD-10-CM | POA: Diagnosis not present

## 2022-04-19 DIAGNOSIS — R1084 Generalized abdominal pain: Secondary | ICD-10-CM

## 2022-04-19 MED ORDER — AIMOVIG 70 MG/ML ~~LOC~~ SOAJ
70.0000 mg | SUBCUTANEOUS | 5 refills | Status: DC
Start: 2022-04-19 — End: 2022-10-13

## 2022-04-19 MED ORDER — TIZANIDINE HCL 4 MG PO TABS
4.0000 mg | ORAL_TABLET | Freq: Four times a day (QID) | ORAL | 4 refills | Status: DC | PRN
Start: 2022-04-19 — End: 2022-04-28

## 2022-04-19 MED ORDER — NYSTATIN 100000 UNIT/GM EX POWD: 1.0000 | Freq: Two times a day (BID) | CUTANEOUS | 0 refills | Status: DC

## 2022-04-19 MED ORDER — CLINDAMYCIN PHOS-BENZOYL PEROX 1-5 % EX GEL: Freq: Two times a day (BID) | CUTANEOUS | 0 refills | Status: DC

## 2022-04-19 NOTE — Telephone Encounter (Signed)
Pt was on cover-[employer] need PA on Aimovig 70MG /ML. Submitted PA w/ [facility]). PA sent to plan.[facility]ITAL[facility] Kitchen

## 2022-04-19 NOTE — Progress Notes (Signed)
   Subjective:   Patient ID: Kristine Perez, female    DOB: 08-21-1991, 31 y.o.   MRN: [id number]  HPI The patient is a 31 YO female coming in for concerns.   Review of Systems  Constitutional: Negative.   HENT: Negative.    Eyes: Negative.   Respiratory:  Negative for cough, chest tightness and shortness of breath.   Cardiovascular:  Negative for chest pain, palpitations and leg swelling.  Gastrointestinal:  Negative for abdominal distention, abdominal pain, constipation, diarrhea, nausea and vomiting.  Musculoskeletal:  Positive for arthralgias and back pain.  Skin:  Positive for rash.  Neurological: Negative.   Psychiatric/Behavioral: Negative.      Objective:  Physical Exam Constitutional:      Appearance: She is well-developed. She is obese.  HENT:     Head: Normocephalic and atraumatic.     Comments: acne Cardiovascular:     Rate and Rhythm: Normal rate and regular rhythm.  Pulmonary:     Effort: Pulmonary effort is normal. No respiratory distress.     Breath sounds: Normal breath sounds. No wheezing or rales.  Abdominal:     General: Bowel sounds are normal. There is no distension.     Palpations: Abdomen is soft.     Tenderness: There is no abdominal tenderness. There is no rebound.  Musculoskeletal:        General: Tenderness present.     Cervical back: Normal range of motion.  Skin:    General: Skin is warm and dry.  Neurological:     Mental Status: She is alert and oriented to person, place, and time.     Coordination: Coordination normal.     Vitals:   04/19/22 1059  BP: 126/72  Pulse: 78  Resp: 18  SpO2: 98%  Weight: 278 lb 9.6 oz (126.4 kg)  Height: 5\' 7"  (1.702 m)    Assessment & Plan:

## 2022-04-19 NOTE — Telephone Encounter (Signed)
Rec'd msg it states " This request has received a Cancelled outcome." There is a previous denial PA on medicine .Marland KitchenRaechel Chute

## 2022-04-19 NOTE — Patient Instructions (Addendum)
We will get the powder for the stomach to use 2-3 times a day for 1 month and then let us know and we can try another for a total of 3 months.  We have also sent in the facial cream for the acne and will get you in with a dermatologist.

## 2022-04-20 NOTE — Telephone Encounter (Signed)
Inform patient

## 2022-04-21 DIAGNOSIS — M793 Panniculitis, unspecified: Secondary | ICD-10-CM | POA: Insufficient documentation

## 2022-04-21 DIAGNOSIS — L709 Acne, unspecified: Secondary | ICD-10-CM | POA: Insufficient documentation

## 2022-04-21 NOTE — Assessment & Plan Note (Signed)
Referral to PT for improvement of her neck pain.

## 2022-04-21 NOTE — Assessment & Plan Note (Signed)
Rx nystatin powder to use on her stomach for recurrent rash. This is causing her pain.

## 2022-04-21 NOTE — Assessment & Plan Note (Signed)
She is unsure why she was unable to get aimovig and still having migraines. Rx done again for this and asked her to check with pharmacy.

## 2022-04-21 NOTE — Assessment & Plan Note (Signed)
Still having pain and met with plastic surgery and she does need documentation of 3 months of treatment with topicals without improvement to help with medical approval of panniculectomy.

## 2022-04-21 NOTE — Assessment & Plan Note (Signed)
Rx benzaclin gel to use BID on the face and referral to dermatology.
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
## 2022-04-28 ENCOUNTER — Other Ambulatory Visit: Payer: Self-pay | Admitting: Internal Medicine

## 2022-05-05 ENCOUNTER — Ambulatory Visit: Payer: BC Managed Care – PPO | Attending: Internal Medicine

## 2022-05-24 ENCOUNTER — Telehealth: Payer: Self-pay | Admitting: Internal Medicine

## 2022-05-24 MED ORDER — KETOCONAZOLE 2 % EX CREA: 1.0000 | TOPICAL_CREAM | Freq: Every day | CUTANEOUS | 0 refills | Status: DC

## 2022-05-24 NOTE — Telephone Encounter (Signed)
Pt was prescribed nystatin (MYCOSTATIN/NYSTOP) powder. Pt states she used the rx for 30 days and she said it did not work and she is requesting an alternative   Please advise

## 2022-05-24 NOTE — Telephone Encounter (Signed)
Have sent in ketoconazole to use daily for 30 days.

## 2022-05-26 NOTE — Telephone Encounter (Signed)
Spoke with pt and she states she has went and picked up medication. She has started using it.

## 2022-06-24 ENCOUNTER — Telehealth: Payer: Self-pay

## 2022-06-24 NOTE — Telephone Encounter (Signed)
Patient wanted to call in and let Dr.Crawford know that the cream she was prescribed is not working.

## 2022-06-25 NOTE — Telephone Encounter (Signed)
Pt stated--rash is getting worse and cream is not working. Pt requesting call back [phone]. please advise

## 2022-06-25 NOTE — Telephone Encounter (Signed)
Pt stated--last OV with Dr. Sharlet Salina talk about trying different cream before going to plastic surgery. Pt since tried 2 different cream, what other alternatives. Please advise

## 2022-06-28 MED ORDER — MUPIROCIN 2 % EX OINT: 1.0000 | TOPICAL_OINTMENT | Freq: Every day | CUTANEOUS | 0 refills | Status: DC

## 2022-06-28 NOTE — Addendum Note (Signed)
Addended by: Pricilla Holm A on: 06/28/2022 09:08 AM   Modules accepted: Orders

## 2022-06-28 NOTE — Telephone Encounter (Signed)
Sent in bactroban ointment to use daily for 30 days to help.

## 2022-06-28 NOTE — Telephone Encounter (Signed)
Notified pt rx sent to the pharmacy.

## 2022-07-14 ENCOUNTER — Ambulatory Visit (INDEPENDENT_AMBULATORY_CARE_PROVIDER_SITE_OTHER): Payer: BC Managed Care – PPO | Admitting: Internal Medicine

## 2022-07-14 VITALS — BP 136/80 | HR 83 | Temp 98.4°F | Ht 67.0 in | Wt 278.0 lb

## 2022-07-14 DIAGNOSIS — R7303 Prediabetes: Secondary | ICD-10-CM

## 2022-07-14 DIAGNOSIS — M793 Panniculitis, unspecified: Secondary | ICD-10-CM | POA: Diagnosis not present

## 2022-07-14 DIAGNOSIS — M5441 Lumbago with sciatica, right side: Secondary | ICD-10-CM | POA: Diagnosis not present

## 2022-07-14 DIAGNOSIS — D509 Iron deficiency anemia, unspecified: Secondary | ICD-10-CM | POA: Diagnosis not present

## 2022-07-14 DIAGNOSIS — G43109 Migraine with aura, not intractable, without status migrainosus: Secondary | ICD-10-CM

## 2022-07-14 DIAGNOSIS — E559 Vitamin D deficiency, unspecified: Secondary | ICD-10-CM

## 2022-07-14 LAB — CBC
HCT: 34.1 % — ABNORMAL LOW (ref 36.0–46.0)
Hemoglobin: 11.2 g/dL — ABNORMAL LOW (ref 12.0–15.0)
MCHC: 32.9 g/dL (ref 30.0–36.0)
MCV: 78.6 fl (ref 78.0–100.0)
Platelets: 313 10*3/uL (ref 150.0–400.0)
RBC: 4.34 Mil/uL (ref 3.87–5.11)
RDW: 14.8 % (ref 11.5–15.5)
WBC: 6.5 10*3/uL (ref 4.0–10.5)

## 2022-07-14 LAB — FERRITIN: Ferritin: 14.7 ng/mL (ref 10.0–291.0)

## 2022-07-14 LAB — VITAMIN D 25 HYDROXY (VIT D DEFICIENCY, FRACTURES): VITD: 14.53 ng/mL — ABNORMAL LOW (ref 30.00–100.00)

## 2022-07-14 LAB — HEMOGLOBIN A1C: Hgb A1c MFr Bld: 6.2 % (ref 4.6–6.5)

## 2022-07-14 MED ORDER — METRONIDAZOLE 0.75 % EX LOTN: TOPICAL_LOTION | CUTANEOUS | 3 refills | Status: DC

## 2022-07-14 MED ORDER — SUMATRIPTAN SUCCINATE 50 MG PO TABS: 50.0000 mg | ORAL_TABLET | ORAL | 6 refills | Status: DC | PRN

## 2022-07-14 MED ORDER — FERROUS GLUCONATE 240 (27 FE) MG PO TABS: 240.0000 mg | ORAL_TABLET | Freq: Every day | ORAL | 3 refills | Status: DC

## 2022-07-14 MED ORDER — CYCLOBENZAPRINE HCL 10 MG PO TABS: 10.0000 mg | ORAL_TABLET | Freq: Three times a day (TID) | ORAL | 6 refills | Status: DC | PRN

## 2022-07-14 NOTE — Progress Notes (Signed)
   Subjective:   Patient ID: Kristine Perez, female    DOB: [birth year], 31 y.o.   MRN: [id number]  HPI The patient is a 31 YO female coming in for back pain ongoing as well as follow up. Did 3 creams each for 1 month and no resolution of stomach pain and panniculitis.  Review of Systems  Constitutional: Negative.   HENT: Negative.    Eyes: Negative.   Respiratory:  Negative for cough, chest tightness and shortness of breath.   Cardiovascular:  Negative for chest pain, palpitations and leg swelling.  Gastrointestinal:  Positive for abdominal pain. Negative for abdominal distention, constipation, diarrhea, nausea and vomiting.       Lower abdominal pain due to pannus.  Musculoskeletal:  Positive for arthralgias, back pain and myalgias.  Skin: Negative.   Neurological: Negative.   Psychiatric/Behavioral: Negative.      Objective:  Physical Exam Constitutional:      Appearance: She is well-developed.  HENT:     Head: Normocephalic and atraumatic.  Cardiovascular:     Rate and Rhythm: Normal rate and regular rhythm.  Pulmonary:     Effort: Pulmonary effort is normal. No respiratory distress.     Breath sounds: Normal breath sounds. No wheezing or rales.  Abdominal:     General: Bowel sounds are normal. There is no distension.     Palpations: Abdomen is soft.     Tenderness: There is abdominal tenderness. There is no rebound.  Musculoskeletal:        General: Tenderness present.     Cervical back: Normal range of motion.  Skin:    General: Skin is warm and dry.  Neurological:     Mental Status: She is alert and oriented to person, place, and time.     Coordination: Coordination normal.     Vitals:   07/14/22 1120  BP: 136/80  Pulse: 83  Temp: 98.4 F (36.9 C)  TempSrc: Oral  SpO2: 99%  Weight: 278 lb (126.1 kg)  Height: 5\' 7"  (1.702 m)    Assessment & Plan:

## 2022-07-14 NOTE — Patient Instructions (Addendum)
We will get you back in with the plastic surgery.  We have sent in sumatriptan to use for headaches.  We have sent in flexeril 10 mg to take up to 3 times a day for the back.  We have sent in metronidazole gel to use on the face daily to help.

## 2022-07-15 ENCOUNTER — Encounter: Payer: Self-pay | Admitting: Internal Medicine

## 2022-07-15 NOTE — Assessment & Plan Note (Signed)
Started aimovig 70 mg monthly less than 1 month ago. She will let us know at 2 month mark effectiveness and we discussed we can increase to 140 mg monthly if needed. Rx sumatriptan as she does not have prn for migraine at this time. Less intense migraines already she is not sure about frequency.

## 2022-07-15 NOTE — Assessment & Plan Note (Signed)
We will stop tizanidine 4 mg TID prn and switch to flexeril 10 mg TID prn as prior treatment has lost effectiveness. Previously referred to PT for treatment.

## 2022-07-15 NOTE — Assessment & Plan Note (Signed)
Checking vitamin D level and adjust as needed.  

## 2022-07-15 NOTE — Assessment & Plan Note (Signed)
Checking HgA1c for follow up. Adjust as needed. 

## 2022-07-15 NOTE — Assessment & Plan Note (Signed)
Checking CBC and ferritin. Adjust as needed. Prior problems with iron otc.

## 2022-07-15 NOTE — Assessment & Plan Note (Signed)
She has undergone conservative treatment with ketoconazole, mupirocin and nystatin each for 1 month of treatment all without improvement of symptoms. She is still having pain and irritation. Refer back to plastic surgery as she desires panniculectomy.

## 2022-07-20 ENCOUNTER — Encounter: Payer: Self-pay | Admitting: Plastic Surgery

## 2022-07-20 ENCOUNTER — Ambulatory Visit (INDEPENDENT_AMBULATORY_CARE_PROVIDER_SITE_OTHER): Payer: BC Managed Care – PPO | Admitting: Plastic Surgery

## 2022-07-20 VITALS — BP 118/81 | HR 84 | Ht 67.0 in | Wt 277.0 lb

## 2022-07-20 DIAGNOSIS — M793 Panniculitis, unspecified: Secondary | ICD-10-CM | POA: Diagnosis not present

## 2022-07-20 DIAGNOSIS — R21 Rash and other nonspecific skin eruption: Secondary | ICD-10-CM | POA: Diagnosis not present

## 2022-07-20 DIAGNOSIS — Z6841 Body Mass Index (BMI) 40.0 and over, adult: Secondary | ICD-10-CM
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
## 2022-08-18 ENCOUNTER — Encounter (INDEPENDENT_AMBULATORY_CARE_PROVIDER_SITE_OTHER): Payer: Self-pay

## 2022-10-13 ENCOUNTER — Encounter: Payer: Self-pay | Admitting: Internal Medicine

## 2022-10-13 ENCOUNTER — Ambulatory Visit (INDEPENDENT_AMBULATORY_CARE_PROVIDER_SITE_OTHER): Payer: BC Managed Care – PPO | Admitting: Internal Medicine

## 2022-10-13 DIAGNOSIS — M793 Panniculitis, unspecified: Secondary | ICD-10-CM

## 2022-10-13 DIAGNOSIS — G43109 Migraine with aura, not intractable, without status migrainosus: Secondary | ICD-10-CM

## 2022-10-13 DIAGNOSIS — D509 Iron deficiency anemia, unspecified: Secondary | ICD-10-CM | POA: Diagnosis not present

## 2022-10-13 MED ORDER — AIMOVIG 140 MG/ML ~~LOC~~ SOAJ: 140.0000 mg | SUBCUTANEOUS | 11 refills | Status: DC

## 2022-10-13 MED ORDER — PHENTERMINE-TOPIRAMATE ER 3.75-23 MG PO CP24: 1.0000 | ORAL_CAPSULE | Freq: Every day | ORAL | 0 refills | Status: DC

## 2022-10-13 MED ORDER — NURTEC 75 MG PO TBDP: 75.0000 mg | ORAL_TABLET | Freq: Every day | ORAL | 6 refills | Status: DC | PRN

## 2022-10-13 MED ORDER — FERROUS GLUCONATE 240 (27 FE) MG PO TABS: 240.0000 mg | ORAL_TABLET | Freq: Every day | ORAL | 3 refills | Status: DC

## 2022-10-13 NOTE — Progress Notes (Unsigned)
   Subjective:   Patient ID: Kristine Perez, female    DOB: [birth year], 32 y.o.   MRN: [id number]  HPI The patient is a 32 YO female coming in for follow up.  Review of Systems  Constitutional: Negative.   HENT: Negative.    Eyes: Negative.   Respiratory:  Negative for cough, chest tightness and shortness of breath.   Cardiovascular:  Negative for chest pain, palpitations and leg swelling.  Gastrointestinal:  Negative for abdominal distention, abdominal pain, constipation, diarrhea, nausea and vomiting.  Musculoskeletal:  Positive for arthralgias and myalgias.  Skin: Negative.   Neurological:  Positive for headaches.  Psychiatric/Behavioral: Negative.      Objective:  Physical Exam Constitutional:      Appearance: She is well-developed. She is obese.  HENT:     Head: Normocephalic and atraumatic.  Cardiovascular:     Rate and Rhythm: Normal rate and regular rhythm.  Pulmonary:     Effort: Pulmonary effort is normal. No respiratory distress.     Breath sounds: Normal breath sounds. No wheezing or rales.  Abdominal:     General: Bowel sounds are normal. There is no distension.     Palpations: Abdomen is soft.     Tenderness: There is no abdominal tenderness. There is no rebound.  Musculoskeletal:     Cervical back: Normal range of motion.  Skin:    General: Skin is warm and dry.  Neurological:     Mental Status: She is alert and oriented to person, place, and time.     Coordination: Coordination normal.     Vitals:   10/13/22 1056  BP: 120/80  Pulse: 80  Temp: 98.6 F (37 C)  TempSrc: Oral  SpO2: 96%  Weight: 270 lb (122.5 kg)  Height: 5\' 7"  (1.702 m)    Assessment & Plan:

## 2022-10-13 NOTE — Patient Instructions (Addendum)
The options for weight loss are contrave and qsymia. We have sent in qsymia to help and this is 1 pill daily to help with the weight. It comes in different doses and we increase every month if doing well.   We have sent in aimovig 140 mg to switch to when due to help the headaches more.  We have sent in nurtec to use daily as needed for migraines.

## 2022-10-14 ENCOUNTER — Encounter: Payer: Self-pay | Admitting: Internal Medicine

## 2022-10-14 NOTE — Assessment & Plan Note (Signed)
Using creams which have helped and she is still pursuing surgical treatment of this which will be beneficial. She is going to work on weight loss first.

## 2022-10-14 NOTE — Assessment & Plan Note (Signed)
Some improvement with aimovig 70 mg monthly but still having some headaches. Increase today to 140 mg monthly new rx done. Sumatriptan is not working for abortive treatment rx nurtec 75 mg daily prn for breakthrough migraines.

## 2022-10-14 NOTE — Assessment & Plan Note (Signed)
Wishes to work on weight loss prior to panniculectomy. Rx qsymia first month and if no side effects will increase monthly until at maximum dose or tolerance reached. Counseled about diet/exercise.

## 2022-10-14 NOTE — Assessment & Plan Note (Signed)
Refilled iron pills to continue daily.

## 2022-10-25 ENCOUNTER — Other Ambulatory Visit: Payer: Self-pay | Admitting: Internal Medicine

## 2022-10-25 DIAGNOSIS — L0292 Furuncle, unspecified: Secondary | ICD-10-CM

## 2022-11-10 ENCOUNTER — Other Ambulatory Visit: Payer: Self-pay | Admitting: Internal Medicine

## 2022-11-23 ENCOUNTER — Ambulatory Visit (INDEPENDENT_AMBULATORY_CARE_PROVIDER_SITE_OTHER): Payer: BC Managed Care – PPO | Admitting: Obstetrics

## 2022-11-23 ENCOUNTER — Encounter: Payer: Self-pay | Admitting: Obstetrics

## 2022-11-23 ENCOUNTER — Other Ambulatory Visit (HOSPITAL_COMMUNITY)
Admission: RE | Admit: 2022-11-23 | Discharge: 2022-11-23 | Disposition: A | Discharge status: Home / Self Care | Payer: BC Managed Care – PPO | Source: Ambulatory Visit | Attending: Obstetrics and Gynecology | Admitting: Obstetrics and Gynecology

## 2022-11-23 VITALS — BP 126/79 | HR 70 | Ht 67.0 in | Wt 277.6 lb

## 2022-11-23 DIAGNOSIS — Z01419 Encounter for gynecological examination (general) (routine) without abnormal findings: Secondary | ICD-10-CM | POA: Insufficient documentation

## 2022-11-23 DIAGNOSIS — N898 Other specified noninflammatory disorders of vagina: Secondary | ICD-10-CM | POA: Diagnosis not present

## 2022-11-23 DIAGNOSIS — Z3009 Encounter for other general counseling and advice on contraception: Secondary | ICD-10-CM

## 2022-11-23 DIAGNOSIS — Z113 Encounter for screening for infections with a predominantly sexual mode of transmission: Secondary | ICD-10-CM

## 2022-11-23 DIAGNOSIS — Z6841 Body Mass Index (BMI) 40.0 and over, adult: Secondary | ICD-10-CM

## 2022-11-23 DIAGNOSIS — E66813 Obesity, class 3: Secondary | ICD-10-CM

## 2022-11-23 DIAGNOSIS — D508 Other iron deficiency anemias: Secondary | ICD-10-CM

## 2022-11-23 DIAGNOSIS — N946 Dysmenorrhea, unspecified: Secondary | ICD-10-CM

## 2022-11-23 MED ORDER — VITAFOL ULTRA 29-0.6-0.4-200 MG PO CAPS: 1.0000 | ORAL_CAPSULE | Freq: Every day | ORAL | 4 refills | Status: DC

## 2022-11-23 MED ORDER — ACCRUFER 30 MG PO CAPS: 1.0000 | ORAL_CAPSULE | Freq: Two times a day (BID) | ORAL | 3 refills | Status: DC

## 2022-11-23 MED ORDER — IBUPROFEN 800 MG PO TABS: 800.0000 mg | ORAL_TABLET | Freq: Three times a day (TID) | ORAL | 5 refills | Status: DC | PRN
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
## 2022-11-23 NOTE — Progress Notes (Signed)
Pt presents for annual exam. Denies any abnormal vaginal discharge, odor, pain. Denies any abnormal breast changes. Pt does not want contraception. No concerns at this time.

## 2022-11-24 ENCOUNTER — Telehealth: Payer: Self-pay

## 2022-11-24 LAB — HEPATITIS C ANTIBODY: Hep C Virus Ab: NONREACTIVE

## 2022-11-24 LAB — CERVICOVAGINAL ANCILLARY ONLY
Chlamydia: NEGATIVE
Comment: NEGATIVE
Comment: NEGATIVE
Comment: NORMAL
Neisseria Gonorrhea: NEGATIVE
Trichomonas: NEGATIVE

## 2022-11-24 LAB — HEPATITIS B SURFACE ANTIGEN: Hepatitis B Surface Ag: NEGATIVE

## 2022-11-24 LAB — HIV ANTIBODY (ROUTINE TESTING W REFLEX): HIV Screen 4th Generation wRfx: NONREACTIVE

## 2022-11-24 LAB — RPR: RPR Ser Ql: NONREACTIVE

## 2022-11-24 NOTE — Telephone Encounter (Signed)
Pt has stated [facility] informed her that she I needing a PA sent in for all 3 medications listed below... Erenumab-aooe (AIMOVIG) 140 MG/ML SOAJ   Phentermine-Topiramate 3.75-23 MG CP24   Rimegepant Sulfate (NURTEC) 75 MG TBDP

## 2022-11-26 ENCOUNTER — Other Ambulatory Visit (HOSPITAL_COMMUNITY): Payer: Self-pay

## 2022-11-26 LAB — CYTOLOGY - PAP
Comment: NEGATIVE
Diagnosis: NEGATIVE
Diagnosis: REACTIVE
High risk HPV: NEGATIVE

## 2022-11-26 NOTE — Telephone Encounter (Signed)
Pharmacy Patient Advocate Encounter   Received notification that prior authorization for Aimovig '140mg'$ /ml is required/requested.   PA submitted on 11/26/22 to (ins) [employer] via [employer] [id number] Status is pending

## 2022-12-01 NOTE — Telephone Encounter (Signed)
Received fax request for additional information regarding PA request. Responded with requested information and e-faxed to [phone].

## 2022-12-06 NOTE — Telephone Encounter (Signed)
Patient Advocate Encounter  Prior Authorization for Aimovig '140MG'$ /ML auto-injectors has been approved through [employer]: [id number]  Effective: 11-26-2022 to 11-26-2023

## 2023-01-12 ENCOUNTER — Ambulatory Visit (INDEPENDENT_AMBULATORY_CARE_PROVIDER_SITE_OTHER): Payer: BC Managed Care – PPO | Admitting: Internal Medicine

## 2023-01-12 ENCOUNTER — Other Ambulatory Visit: Payer: Self-pay | Admitting: Internal Medicine

## 2023-01-12 ENCOUNTER — Encounter: Payer: Self-pay | Admitting: Internal Medicine

## 2023-01-12 VITALS — BP 124/70 | HR 77 | Temp 98.5°F | Ht 67.0 in | Wt 276.0 lb

## 2023-01-12 DIAGNOSIS — L709 Acne, unspecified: Secondary | ICD-10-CM | POA: Diagnosis not present

## 2023-01-12 DIAGNOSIS — G43E09 Chronic migraine with aura, not intractable, without status migrainosus: Secondary | ICD-10-CM

## 2023-01-12 MED ORDER — TIZANIDINE HCL 4 MG PO TABS: 4.0000 mg | ORAL_TABLET | Freq: Four times a day (QID) | ORAL | 3 refills | Status: DC | PRN

## 2023-01-12 MED ORDER — CYCLOBENZAPRINE HCL 10 MG PO TABS: 10.0000 mg | ORAL_TABLET | Freq: Three times a day (TID) | ORAL | 3 refills | Status: AC | PRN

## 2023-01-12 NOTE — Patient Instructions (Addendum)
Aimovig was approved so you can get that. We will do another referral to dermatology so let us know if you do not hear back.

## 2023-01-12 NOTE — Progress Notes (Unsigned)
   Subjective:   Patient ID: Kristine Perez, female    DOB: [birth year], 32 y.o.   MRN: [id number]  HPI The patient is a 32 YO female coming in for worsening migraines. Did not get higher dose aimovig due to communication issues and PA needed. Same with nurtec and phentermine/topiramate. Otherwise stable.   Review of Systems  Constitutional:  Positive for activity change.  HENT: Negative.    Eyes: Negative.   Respiratory:  Negative for cough, chest tightness and shortness of breath.   Cardiovascular:  Negative for chest pain, palpitations and leg swelling.  Gastrointestinal:  Negative for abdominal distention, abdominal pain, constipation, diarrhea, nausea and vomiting.  Musculoskeletal: Negative.   Skin: Negative.   Neurological:  Positive for headaches.  Psychiatric/Behavioral: Negative.      Objective:  Physical Exam Constitutional:      Appearance: She is well-developed. She is obese.  HENT:     Head: Normocephalic and atraumatic.  Cardiovascular:     Rate and Rhythm: Normal rate and regular rhythm.  Pulmonary:     Effort: Pulmonary effort is normal. No respiratory distress.     Breath sounds: Normal breath sounds. No wheezing or rales.  Abdominal:     General: Bowel sounds are normal. There is no distension.     Palpations: Abdomen is soft.     Tenderness: There is no abdominal tenderness. There is no rebound.  Musculoskeletal:     Cervical back: Normal range of motion.  Skin:    General: Skin is warm and dry.  Neurological:     Mental Status: She is alert and oriented to person, place, and time.     Coordination: Coordination normal.     Vitals:   01/12/23 1009  BP: 124/70  Pulse: 77  Temp: 98.5 F (36.9 C)  TempSrc: Oral  SpO2: 97%  Weight: 276 lb (125.2 kg)  Height: 5\' 7"  (1.702 m)    Assessment & Plan:

## 2023-01-13 NOTE — Assessment & Plan Note (Signed)
We will try to do PA for phentermine/topiramate but did not get notification for this from pharmacy. Advised patient if she is unable to get medication to let us know otherwise we cannot help.

## 2023-01-13 NOTE — Assessment & Plan Note (Signed)
Per our records aimovig was approved in March and she is advised to contact pharmacy. She is also advised in future not to run out of medicine to let us know in advance and we can ensure she does not abruptly go off controller medicine for migraine. She was previously on 70 mg monthly aimovig but last taken 2-3 months ago. She will now start 140 mg monthly aimovig.

## 2023-06-15 ENCOUNTER — Other Ambulatory Visit: Payer: Self-pay | Admitting: Internal Medicine

## 2023-10-17 ENCOUNTER — Ambulatory Visit: Payer: BC Managed Care – PPO | Admitting: Internal Medicine

## 2023-10-18 ENCOUNTER — Ambulatory Visit: Payer: BC Managed Care – PPO | Admitting: Internal Medicine

## 2023-10-18 ENCOUNTER — Other Ambulatory Visit (HOSPITAL_COMMUNITY)
Admission: RE | Admit: 2023-10-18 | Discharge: 2023-10-18 | Disposition: A | Discharge status: Home / Self Care | Payer: BC Managed Care – PPO | Source: Ambulatory Visit | Attending: Family Medicine | Admitting: Family Medicine

## 2023-10-18 ENCOUNTER — Ambulatory Visit: Payer: BC Managed Care – PPO

## 2023-10-18 VITALS — BP 128/85 | HR 76 | Ht 66.0 in | Wt 287.0 lb

## 2023-10-18 VITALS — BP 118/82 | HR 74 | Temp 98.5°F | Ht 66.0 in | Wt 281.0 lb

## 2023-10-18 DIAGNOSIS — Z113 Encounter for screening for infections with a predominantly sexual mode of transmission: Secondary | ICD-10-CM

## 2023-10-18 DIAGNOSIS — L0292 Furuncle, unspecified: Secondary | ICD-10-CM | POA: Diagnosis not present

## 2023-10-18 DIAGNOSIS — N898 Other specified noninflammatory disorders of vagina: Secondary | ICD-10-CM | POA: Diagnosis present

## 2023-10-18 DIAGNOSIS — G43E09 Chronic migraine with aura, not intractable, without status migrainosus: Secondary | ICD-10-CM

## 2023-10-18 DIAGNOSIS — L709 Acne, unspecified: Secondary | ICD-10-CM | POA: Diagnosis not present

## 2023-10-18 DIAGNOSIS — R7303 Prediabetes: Secondary | ICD-10-CM

## 2023-10-18 DIAGNOSIS — Z1231 Encounter for screening mammogram for malignant neoplasm of breast: Secondary | ICD-10-CM | POA: Diagnosis not present

## 2023-10-18 DIAGNOSIS — R1084 Generalized abdominal pain: Secondary | ICD-10-CM

## 2023-10-18 DIAGNOSIS — D508 Other iron deficiency anemias: Secondary | ICD-10-CM | POA: Diagnosis not present

## 2023-10-18 LAB — COMPREHENSIVE METABOLIC PANEL
ALT: 13 U/L (ref 0–35)
AST: 14 U/L (ref 0–37)
Albumin: 4.3 g/dL (ref 3.5–5.2)
Alkaline Phosphatase: 39 U/L (ref 39–117)
BUN: 12 mg/dL (ref 6–23)
CO2: 28 meq/L (ref 19–32)
Calcium: 9.2 mg/dL (ref 8.4–10.5)
Chloride: 102 meq/L (ref 96–112)
Creatinine, Ser: 0.71 mg/dL (ref 0.40–1.20)
GFR: 113.7 mL/min (ref >60.00)
Glucose, Bld: 94 mg/dL (ref 70–99)
Potassium: 4.1 meq/L (ref 3.5–5.1)
Sodium: 136 meq/L (ref 135–145)
Total Bilirubin: 0.4 mg/dL (ref 0.2–1.2)
Total Protein: 7.7 g/dL (ref 6.0–8.3)

## 2023-10-18 LAB — LIPID PANEL
Cholesterol: 194 mg/dL (ref 0–200)
HDL: 43.6 mg/dL (ref >39.00)
LDL Cholesterol: 129 mg/dL — ABNORMAL HIGH (ref 0–99)
NonHDL: 149.95
Total CHOL/HDL Ratio: 4
Triglycerides: 104 mg/dL (ref 0.0–149.0)
VLDL: 20.8 mg/dL (ref 0.0–40.0)

## 2023-10-18 LAB — CBC
HCT: 34.5 % — ABNORMAL LOW (ref 36.0–46.0)
Hemoglobin: 11.2 g/dL — ABNORMAL LOW (ref 12.0–15.0)
MCHC: 32.5 g/dL (ref 30.0–36.0)
MCV: 77.7 fL — ABNORMAL LOW (ref 78.0–100.0)
Platelets: 348 10*3/uL (ref 150.0–400.0)
RBC: 4.44 Mil/uL (ref 3.87–5.11)
RDW: 14.3 % (ref 11.5–15.5)
WBC: 6.9 10*3/uL (ref 4.0–10.5)

## 2023-10-18 LAB — HEMOGLOBIN A1C: Hgb A1c MFr Bld: 6.1 % (ref 4.6–6.5)

## 2023-10-18 MED ORDER — SUMATRIPTAN SUCCINATE 100 MG PO TABS: 100.0000 mg | ORAL_TABLET | ORAL | 0 refills | Status: DC | PRN

## 2023-10-18 MED ORDER — IBUPROFEN 800 MG PO TABS: 800.0000 mg | ORAL_TABLET | Freq: Three times a day (TID) | ORAL | 3 refills | Status: DC | PRN

## 2023-10-18 MED ORDER — KETOCONAZOLE 2 % EX CREA: 1.0000 | TOPICAL_CREAM | Freq: Every day | CUTANEOUS | 0 refills | Status: DC

## 2023-10-18 MED ORDER — METRONIDAZOLE 0.75 % EX LOTN: TOPICAL_LOTION | CUTANEOUS | 3 refills | Status: DC

## 2023-10-18 MED ORDER — MUPIROCIN 2 % EX OINT: 1.0000 | TOPICAL_OINTMENT | Freq: Every day | CUTANEOUS | 0 refills | Status: DC

## 2023-10-18 MED ORDER — SEMAGLUTIDE-WEIGHT MANAGEMENT 1.7 MG/0.75ML ~~LOC~~ SOAJ: 1.7000 mg | SUBCUTANEOUS | 0 refills | Status: AC

## 2023-10-18 MED ORDER — SEMAGLUTIDE-WEIGHT MANAGEMENT 0.25 MG/0.5ML ~~LOC~~ SOAJ: 0.2500 mg | SUBCUTANEOUS | 0 refills | Status: DC

## 2023-10-18 MED ORDER — CLINDAMYCIN PHOSPHATE 1 % EX LOTN: TOPICAL_LOTION | Freq: Two times a day (BID) | CUTANEOUS | 11 refills | Status: DC

## 2023-10-18 MED ORDER — SEMAGLUTIDE-WEIGHT MANAGEMENT 2.4 MG/0.75ML ~~LOC~~ SOAJ: 2.4000 mg | SUBCUTANEOUS | 0 refills | Status: DC

## 2023-10-18 MED ORDER — SEMAGLUTIDE-WEIGHT MANAGEMENT 1 MG/0.5ML ~~LOC~~ SOAJ: 1.0000 mg | SUBCUTANEOUS | 0 refills | Status: AC

## 2023-10-18 MED ORDER — AIMOVIG 140 MG/ML ~~LOC~~ SOAJ: 140.0000 mg | SUBCUTANEOUS | 11 refills | Status: AC

## 2023-10-18 MED ORDER — VITAFOL ULTRA 29-0.6-0.4-200 MG PO CAPS: 1.0000 | ORAL_CAPSULE | Freq: Every day | ORAL | 4 refills | Status: DC

## 2023-10-18 MED ORDER — SEMAGLUTIDE-WEIGHT MANAGEMENT 0.5 MG/0.5ML ~~LOC~~ SOAJ: 0.5000 mg | SUBCUTANEOUS | 0 refills | Status: AC

## 2023-10-18 NOTE — Patient Instructions (Signed)
 We have sent in the stronger dose of sumatripan for migraines.   We will do labs and CT scan of the stomach.   We will get you in with dermatologist.

## 2023-10-18 NOTE — Progress Notes (Signed)
 SUBJECTIVE:  33 y.o. female complains of clear vaginal discharge for 2 day(s). Denies abnormal vaginal bleeding or significant pelvic pain or fever. No UTI symptoms. Denies history of known exposure to STD.  No LMP recorded.  OBJECTIVE:  She appears well, afebrile. Urine dipstick: not done.  ASSESSMENT:  Vaginal Discharge  Vaginal Odor   PLAN:  GC, chlamydia, trichomonas, BVAG, CVAG probe sent to lab. Treatment: To be determined once lab results are received ROV prn if symptoms persist or worsen.

## 2023-10-18 NOTE — Progress Notes (Signed)
   Subjective:   Patient ID: Kristine Perez, female    DOB: 02-18-1991, 33 y.o.   MRN: [id number]  HPI The patient is a 33 YO female coming in for several concerns see A/P for details.   Review of Systems  Constitutional:  Positive for unexpected weight change.  HENT: Negative.    Eyes: Negative.   Respiratory:  Negative for cough, chest tightness and shortness of breath.   Cardiovascular:  Negative for chest pain, palpitations and leg swelling.  Gastrointestinal:  Negative for abdominal distention, abdominal pain, constipation, diarrhea, nausea and vomiting.  Musculoskeletal: Negative.   Skin:  Positive for color change.  Neurological:  Positive for headaches.  Psychiatric/Behavioral: Negative.      Objective:  Physical Exam Constitutional:      Appearance: She is well-developed.  HENT:     Head: Normocephalic and atraumatic.  Cardiovascular:     Rate and Rhythm: Normal rate and regular rhythm.  Pulmonary:     Effort: Pulmonary effort is normal. No respiratory distress.     Breath sounds: Normal breath sounds. No wheezing or rales.  Abdominal:     General: Bowel sounds are normal. There is no distension.     Palpations: Abdomen is soft.     Tenderness: There is no abdominal tenderness. There is no rebound.  Musculoskeletal:     Cervical back: Normal range of motion.  Skin:    General: Skin is warm and dry.  Neurological:     Mental Status: She is alert and oriented to person, place, and time.     Coordination: Coordination normal.     Vitals:   10/18/23 1010  BP: 118/82  Pulse: 74  Temp: 98.5 F (36.9 C)  TempSrc: Oral  SpO2: 98%  Weight: 281 lb (127.5 kg)  Height: 5' 6 (1.676 m)    Assessment & Plan:

## 2023-10-19 LAB — HEPATITIS B SURFACE ANTIGEN: Hepatitis B Surface Ag: NEGATIVE

## 2023-10-19 LAB — HEPATITIS C ANTIBODY: Hep C Virus Ab: NONREACTIVE

## 2023-10-19 LAB — HIV ANTIBODY (ROUTINE TESTING W REFLEX): HIV Screen 4th Generation wRfx: NONREACTIVE

## 2023-10-19 LAB — RPR: RPR Ser Ql: NONREACTIVE

## 2023-10-20 LAB — CERVICOVAGINAL ANCILLARY ONLY
Bacterial Vaginitis (gardnerella): POSITIVE — AB
Candida Glabrata: NEGATIVE
Candida Vaginitis: NEGATIVE
Chlamydia: NEGATIVE
Comment: NEGATIVE
Comment: NEGATIVE
Comment: NEGATIVE
Comment: NEGATIVE
Comment: NEGATIVE
Comment: NORMAL
Neisseria Gonorrhea: NEGATIVE
Trichomonas: NEGATIVE

## 2023-10-21 ENCOUNTER — Encounter: Payer: Self-pay | Admitting: Internal Medicine

## 2023-10-21 NOTE — Assessment & Plan Note (Signed)
Needs referral for dermatology as she is still struggling with acne.

## 2023-10-21 NOTE — Assessment & Plan Note (Signed)
Checking HGA1c and adjust as needed.

## 2023-10-21 NOTE — Assessment & Plan Note (Signed)
Needs increase to sumatriptan. Overall aimovig 140 mg monthly is helping a lot and very few migraines overall. Rx sumatriptan 100 mg prn migraines.

## 2023-10-21 NOTE — Assessment & Plan Note (Signed)
Going on for some time and worsening despite changes to diet. Given past procedures should have CT abdomen and pelvis. We have previously done US abdomen without clear etiology. If no etiology need referral to GI some bowel changes as well.

## 2023-10-21 NOTE — Assessment & Plan Note (Signed)
Wants to try wegovy and titration sent in for her and discussed risk/benefit.

## 2023-10-24 ENCOUNTER — Telehealth: Payer: Self-pay | Admitting: Pharmacist

## 2023-10-24 ENCOUNTER — Encounter: Payer: Self-pay | Admitting: Internal Medicine

## 2023-10-24 NOTE — Telephone Encounter (Signed)
Pharmacy Patient Advocate Encounter   Received notification from Patient Pharmacy that prior authorization for [facility] 0.25MG /0.5ML auto-injectors is required/requested.   Insurance verification completed.   The patient is insured through [facility] .   Per test claim: PA required; PA submitted to above mentioned insurance via CoverMyMeds Key/confirmation #/EOC [id number] Status is pending

## 2023-10-25 NOTE — Telephone Encounter (Signed)
Pharmacy Patient Advocate Encounter  Received notification from [facility] that Prior Authorization for Wegovy 0.25MG /0.5ML auto-injectors has been APPROVED from 10/24/2023 to 02/27/2024   PA #/Case ID/Reference #: [id number]

## 2023-10-26 ENCOUNTER — Encounter: Payer: Self-pay | Admitting: Family Medicine

## 2023-10-26 MED ORDER — METRONIDAZOLE 500 MG PO TABS: 500.0000 mg | ORAL_TABLET | Freq: Two times a day (BID) | ORAL | 0 refills | Status: DC

## 2023-10-26 NOTE — Addendum Note (Signed)
Addended by: Celedonio Savage on: 10/26/2023 01:53 PM   Modules accepted: Orders

## 2023-11-14 ENCOUNTER — Encounter: Payer: Self-pay | Admitting: Internal Medicine

## 2023-11-15 ENCOUNTER — Other Ambulatory Visit: Payer: BC Managed Care – PPO

## 2023-11-18 ENCOUNTER — Other Ambulatory Visit: Payer: BC Managed Care – PPO

## 2023-11-18 ENCOUNTER — Ambulatory Visit: Payer: BC Managed Care – PPO | Admitting: Obstetrics

## 2023-12-02 ENCOUNTER — Other Ambulatory Visit (HOSPITAL_COMMUNITY): Payer: Self-pay

## 2023-12-02 ENCOUNTER — Telehealth: Payer: Self-pay

## 2023-12-02 NOTE — Telephone Encounter (Signed)
 Pharmacy Patient Advocate Encounter   Received notification from Onbase that prior authorization for Aimovig 140MG /ML auto-injectors is required/requested.   Insurance verification completed.   The patient is insured through [facility] .   Per test claim: PA required and submitted KEY/EOC/Request #: [id number] CANCELLED due to: Submit Bill To Other Processor Or Primary Payer

## 2023-12-15 ENCOUNTER — Other Ambulatory Visit: Payer: BC Managed Care – PPO

## 2023-12-21 ENCOUNTER — Ambulatory Visit
Admission: RE | Admit: 2023-12-21 | Discharge: 2023-12-21 | Disposition: A | Discharge status: Home / Self Care | Payer: BC Managed Care – PPO | Source: Ambulatory Visit | Attending: Internal Medicine | Admitting: Internal Medicine

## 2023-12-21 DIAGNOSIS — Z1231 Encounter for screening mammogram for malignant neoplasm of breast: Secondary | ICD-10-CM

## 2024-01-02 ENCOUNTER — Other Ambulatory Visit: Payer: Self-pay | Admitting: Internal Medicine

## 2024-01-02 DIAGNOSIS — L0292 Furuncle, unspecified: Secondary | ICD-10-CM

## 2024-01-09 ENCOUNTER — Other Ambulatory Visit (HOSPITAL_COMMUNITY): Payer: Self-pay

## 2024-01-10 ENCOUNTER — Other Ambulatory Visit

## 2024-01-12 ENCOUNTER — Telehealth: Payer: Self-pay | Admitting: Internal Medicine

## 2024-01-12 ENCOUNTER — Other Ambulatory Visit (HOSPITAL_COMMUNITY): Payer: Self-pay

## 2024-01-12 NOTE — Telephone Encounter (Signed)
 Good afternoon we are unable to submit a PA for Kristine Perez at this time because the only prescription coverage we have on file is [employer] and according to [employer] she has a Editor, commissioning that we have to submit her pharmacy claims to 1st. If she no longer has primary pharmacy coverage then she must contact [employer] to let them know so that they can update their records.

## 2024-01-12 NOTE — Telephone Encounter (Signed)
 Copied from CRM [id number]. Topic: Clinical - Prescription Issue >> Jan 11, 2024  4:56 PM [place] J wrote: Reason for CRM: Patient's pharmacy is telling her that prior authorizations are needed for her Semaglutide-Weight Management (WEGOVY), mupirocin ointment (BACTROBAN) 2 %, and Erenumab-aooe (AIMOVIG) 140 MG.   On the medication order for Semaglutide-Weight Management [facility]), it stated that she's due to start the next dose on the 11th but due to the prior authorization issue, patient is concerned about conflict.

## 2024-01-23 ENCOUNTER — Ambulatory Visit: Admitting: Obstetrics

## 2024-01-27 ENCOUNTER — Other Ambulatory Visit

## 2024-02-06 ENCOUNTER — Other Ambulatory Visit (HOSPITAL_COMMUNITY): Payer: Self-pay

## 2024-02-06 ENCOUNTER — Ambulatory Visit: Admitting: Obstetrics

## 2024-02-06 ENCOUNTER — Telehealth: Payer: Self-pay

## 2024-02-06 ENCOUNTER — Encounter: Payer: Self-pay | Admitting: Obstetrics

## 2024-02-06 ENCOUNTER — Other Ambulatory Visit (HOSPITAL_COMMUNITY)
Admission: RE | Admit: 2024-02-06 | Discharge: 2024-02-06 | Disposition: A | Discharge status: Home / Self Care | Source: Ambulatory Visit | Attending: Obstetrics | Admitting: Obstetrics

## 2024-02-06 VITALS — BP 121/81 | HR 82 | Ht 67.0 in | Wt 278.0 lb

## 2024-02-06 DIAGNOSIS — L0292 Furuncle, unspecified: Secondary | ICD-10-CM | POA: Diagnosis not present

## 2024-02-06 DIAGNOSIS — N898 Other specified noninflammatory disorders of vagina: Secondary | ICD-10-CM | POA: Insufficient documentation

## 2024-02-06 DIAGNOSIS — E66813 Obesity, class 3: Secondary | ICD-10-CM

## 2024-02-06 DIAGNOSIS — Z113 Encounter for screening for infections with a predominantly sexual mode of transmission: Secondary | ICD-10-CM

## 2024-02-06 DIAGNOSIS — N946 Dysmenorrhea, unspecified: Secondary | ICD-10-CM

## 2024-02-06 DIAGNOSIS — Z1339 Encounter for screening examination for other mental health and behavioral disorders: Secondary | ICD-10-CM

## 2024-02-06 DIAGNOSIS — Z6841 Body Mass Index (BMI) 40.0 and over, adult: Secondary | ICD-10-CM

## 2024-02-06 DIAGNOSIS — Z01419 Encounter for gynecological examination (general) (routine) without abnormal findings: Secondary | ICD-10-CM | POA: Diagnosis not present

## 2024-02-06 DIAGNOSIS — Z3009 Encounter for other general counseling and advice on contraception: Secondary | ICD-10-CM

## 2024-02-06 DIAGNOSIS — D508 Other iron deficiency anemias: Secondary | ICD-10-CM | POA: Diagnosis not present

## 2024-02-06 MED ORDER — VITAFOL ULTRA 29-0.6-0.4-200 MG PO CAPS: 1.0000 | ORAL_CAPSULE | Freq: Every day | ORAL | 4 refills | Status: AC

## 2024-02-06 MED ORDER — ACCRUFER 30 MG PO CAPS: 1.0000 | ORAL_CAPSULE | Freq: Two times a day (BID) | ORAL | 3 refills | Status: AC

## 2024-02-06 MED ORDER — CLINDAMYCIN PHOSPHATE 1 % EX LOTN: TOPICAL_LOTION | Freq: Two times a day (BID) | CUTANEOUS | 11 refills | Status: DC

## 2024-02-06 MED ORDER — IBUPROFEN 800 MG PO TABS: 800.0000 mg | ORAL_TABLET | Freq: Three times a day (TID) | ORAL | 5 refills | Status: DC | PRN

## 2024-02-06 NOTE — Telephone Encounter (Signed)
 Pharmacy Patient Advocate Encounter  Received notification from [facility] that Prior Authorization for Aimovig  140mg /ml has been APPROVED from 02/06/24 to 02/05/25   PA #/Case ID/Reference #: [id number]
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
## 2024-02-06 NOTE — Telephone Encounter (Signed)
 Pharmacy Patient Advocate Encounter   Received notification from Pt Calls Messages that prior authorization for Aimovig  140mg /ml is required/requested.   Insurance verification completed.   The patient is insured through [facility] .   Per test claim: PA required; PA submitted to above mentioned insurance via CoverMyMeds Key/confirmation #/EOC [id number] Status is pending

## 2024-02-06 NOTE — Telephone Encounter (Signed)
 Copied from CRM [id number]. Topic: Clinical - Prescription Issue >> Feb 06, 2024 10:46 AM Kristine Perez wrote: Reason for CRM: patient calling to check on prior authorization for Wegovy  and Aimovig , she only has [employer]. She did call and update with her insurance.

## 2024-02-06 NOTE — Telephone Encounter (Signed)
 Pharmacy Patient Advocate Encounter   Received notification from Pt Calls Messages that prior authorization for Wegovy  0.25mg /0.75ml is required/requested.   Insurance verification completed.   The patient is insured through [facility] .   Per test claim: PA required; PA submitted to above mentioned insurance via CoverMyMeds Key/confirmation #/EOC [id number] Status is pending

## 2024-02-07 LAB — CERVICOVAGINAL ANCILLARY ONLY
Bacterial Vaginitis (gardnerella): POSITIVE — AB
Candida Glabrata: NEGATIVE
Candida Vaginitis: NEGATIVE
Chlamydia: NEGATIVE
Comment: NEGATIVE
Comment: NEGATIVE
Comment: NEGATIVE
Comment: NEGATIVE
Comment: NEGATIVE
Comment: NORMAL
Neisseria Gonorrhea: NEGATIVE
Trichomonas: NEGATIVE

## 2024-02-07 NOTE — Telephone Encounter (Signed)
 Pharmacy Patient Advocate Encounter  Received notification from [facility] that Prior Authorization for Wegovy0.25mg /0.3ml has been APPROVED from 02/06/24 to 08/04/24   PA #/Case ID/Reference #: [id number]

## 2024-02-08 ENCOUNTER — Other Ambulatory Visit (HOSPITAL_COMMUNITY): Payer: Self-pay

## 2024-02-08 LAB — HIV ANTIBODY (ROUTINE TESTING W REFLEX): HIV Screen 4th Generation wRfx: NONREACTIVE

## 2024-02-08 LAB — HEPATITIS C ANTIBODY: Hep C Virus Ab: NONREACTIVE

## 2024-02-08 LAB — RPR: RPR Ser Ql: NONREACTIVE

## 2024-02-08 LAB — HEPATITIS B SURFACE ANTIGEN: Hepatitis B Surface Ag: NEGATIVE

## 2024-02-09 ENCOUNTER — Other Ambulatory Visit: Payer: Self-pay | Admitting: Obstetrics

## 2024-02-09 ENCOUNTER — Ambulatory Visit
Admission: RE | Admit: 2024-02-09 | Discharge: 2024-02-09 | Disposition: A | Discharge status: Home / Self Care | Source: Ambulatory Visit | Attending: Internal Medicine | Admitting: Internal Medicine

## 2024-02-09 DIAGNOSIS — N760 Acute vaginitis: Secondary | ICD-10-CM

## 2024-02-09 DIAGNOSIS — R1084 Generalized abdominal pain: Secondary | ICD-10-CM

## 2024-02-09 LAB — CYTOLOGY - PAP
Comment: NEGATIVE
Diagnosis: NEGATIVE
Diagnosis: REACTIVE
High risk HPV: POSITIVE — AB

## 2024-02-09 MED ORDER — METRONIDAZOLE 500 MG PO TABS: 500.0000 mg | ORAL_TABLET | Freq: Two times a day (BID) | ORAL | 2 refills | Status: DC

## 2024-02-10 ENCOUNTER — Encounter: Payer: Self-pay | Admitting: Internal Medicine

## 2024-02-22 ENCOUNTER — Ambulatory Visit: Admitting: Licensed Clinical Social Worker

## 2024-02-22 DIAGNOSIS — F4320 Adjustment disorder, unspecified: Secondary | ICD-10-CM
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
## 2024-02-24 ENCOUNTER — Ambulatory Visit: Admitting: Internal Medicine

## 2024-02-24 ENCOUNTER — Encounter: Payer: Self-pay | Admitting: Internal Medicine

## 2024-02-24 VITALS — BP 114/82 | HR 82 | Temp 98.8°F | Ht 67.0 in | Wt 278.0 lb

## 2024-02-24 DIAGNOSIS — M5136 Other intervertebral disc degeneration, lumbar region with discogenic back pain only: Secondary | ICD-10-CM

## 2024-02-24 DIAGNOSIS — M793 Panniculitis, unspecified: Secondary | ICD-10-CM | POA: Diagnosis not present

## 2024-02-24 DIAGNOSIS — K8020 Calculus of gallbladder without cholecystitis without obstruction: Secondary | ICD-10-CM | POA: Insufficient documentation

## 2024-02-24 DIAGNOSIS — Z6841 Body Mass Index (BMI) 40.0 and over, adult: Secondary | ICD-10-CM

## 2024-02-24 DIAGNOSIS — R1084 Generalized abdominal pain: Secondary | ICD-10-CM

## 2024-02-24 DIAGNOSIS — M51369 Other intervertebral disc degeneration, lumbar region without mention of lumbar back pain or lower extremity pain: Secondary | ICD-10-CM | POA: Insufficient documentation

## 2024-02-24 MED ORDER — PANTOPRAZOLE SODIUM 40 MG PO TBEC
40.0000 mg | DELAYED_RELEASE_TABLET | Freq: Every day | ORAL | 3 refills | Status: AC
Start: 2024-02-24 — End: ?

## 2024-02-24 MED ORDER — TIZANIDINE HCL 4 MG PO TABS
4.0000 mg | ORAL_TABLET | Freq: Four times a day (QID) | ORAL | 4 refills | Status: AC | PRN
Start: 2024-02-24 — End: ?

## 2024-02-24 NOTE — Assessment & Plan Note (Signed)
 She is having back pain with doing movements. She is aware that weight change is likely impacting. No prior injury or sports to affect. Rx tizanidine  as this is helpful for symptoms.

## 2024-02-24 NOTE — Patient Instructions (Signed)
 We have refilled the muscle relaxers.   We have sent in the acid medicine protonix  (pantoprazole ) to take 1 pill daily before breakfast and let us  know in 1-2 weeks how this is doing.

## 2024-02-24 NOTE — Assessment & Plan Note (Signed)
 Wishes to return for surgery assessment referral done.

## 2024-02-24 NOTE — Assessment & Plan Note (Signed)
 Some of this sounds to be gerd rx done for pantoprazole 40 mg daily. She will let us  know in 1-2 weeks if this is effective. She does have gallstones and we discussed typical symptoms for this and she does not have typical symptoms at this time.

## 2024-02-24 NOTE — Assessment & Plan Note (Signed)
 Weight is likely impacting back pain and we discussed she is wanting to get this back down.

## 2024-02-24 NOTE — Assessment & Plan Note (Signed)
 Discussed findings and she does not have typical symptoms at this time. Discussed if she gets recurrent symptoms we would refer to surgery for removal.

## 2024-02-24 NOTE — Progress Notes (Signed)
   Subjective:   Patient ID: Kristine Perez, female    DOB: Feb 03, 1991, 33 y.o.   MRN: [id number]  HPI The patient is a 33 YO female coming in for ongoing abdominal symptoms. She is wanting to pursue surgery for her weight/stomach. She has talked to them before and wishes to return. She is having some acid reflux symptoms. We reviewed CT scan and gallstones. Does not have symptoms of those.   Review of Systems  Constitutional: Negative.   HENT: Negative.    Eyes: Negative.   Respiratory:  Negative for cough, chest tightness and shortness of breath.   Cardiovascular:  Negative for chest pain, palpitations and leg swelling.  Gastrointestinal:  Positive for abdominal distention and abdominal pain. Negative for constipation, diarrhea, nausea and vomiting.  Musculoskeletal:  Positive for back pain.  Skin: Negative.   Neurological: Negative.   Psychiatric/Behavioral: Negative.      Objective:  Physical Exam Constitutional:      Appearance: She is well-developed. She is obese.  HENT:     Head: Normocephalic and atraumatic.  Cardiovascular:     Rate and Rhythm: Normal rate and regular rhythm.  Pulmonary:     Effort: Pulmonary effort is normal. No respiratory distress.     Breath sounds: Normal breath sounds. No wheezing or rales.  Abdominal:     General: Bowel sounds are normal. There is no distension.     Palpations: Abdomen is soft.     Tenderness: There is no abdominal tenderness. There is no rebound.  Musculoskeletal:     Cervical back: Normal range of motion.  Skin:    General: Skin is warm and dry.  Neurological:     Mental Status: She is alert and oriented to person, place, and time.     Coordination: Coordination normal.     Vitals:   02/24/24 1122  BP: 114/82  Pulse: 82  Temp: 98.8 F (37.1 C)  TempSrc: Oral  SpO2: 99%  Weight: 278 lb (126.1 kg)  Height: 5\' 7"  (1.702 m)    Assessment & Plan:

## 2024-02-28 ENCOUNTER — Emergency Department (HOSPITAL_COMMUNITY)

## 2024-02-28 ENCOUNTER — Other Ambulatory Visit: Payer: Self-pay

## 2024-02-28 ENCOUNTER — Emergency Department (HOSPITAL_COMMUNITY): Admission: EM | Admit: 2024-02-28 | Discharge: 2024-02-28 | Disposition: A | Discharge status: Home / Self Care

## 2024-02-28 ENCOUNTER — Encounter (HOSPITAL_COMMUNITY): Payer: Self-pay

## 2024-02-28 DIAGNOSIS — R112 Nausea with vomiting, unspecified: Secondary | ICD-10-CM | POA: Diagnosis present

## 2024-02-28 DIAGNOSIS — T887XXA Unspecified adverse effect of drug or medicament, initial encounter: Secondary | ICD-10-CM

## 2024-02-28 DIAGNOSIS — T50995A Adverse effect of other drugs, medicaments and biological substances, initial encounter: Secondary | ICD-10-CM | POA: Diagnosis not present

## 2024-02-28 LAB — COMPREHENSIVE METABOLIC PANEL WITH GFR
ALT: 18 U/L (ref 0–44)
AST: 16 U/L (ref 15–41)
Albumin: 4 g/dL (ref 3.5–5.0)
Alkaline Phosphatase: 35 U/L — ABNORMAL LOW (ref 38–126)
Anion gap: 10 (ref 5–15)
BUN: 8 mg/dL (ref 6–20)
CO2: 27 mmol/L (ref 22–32)
Calcium: 9.3 mg/dL (ref 8.9–10.3)
Chloride: 98 mmol/L (ref 98–111)
Creatinine, Ser: 0.71 mg/dL (ref 0.44–1.00)
GFR, Estimated: >60 mL/min (ref >60)
Glucose, Bld: 88 mg/dL (ref 70–99)
Potassium: 3.6 mmol/L (ref 3.5–5.1)
Sodium: 135 mmol/L (ref 135–145)
Total Bilirubin: 0.4 mg/dL (ref 0.0–1.2)
Total Protein: 7.9 g/dL (ref 6.5–8.1)

## 2024-02-28 LAB — CBC WITH DIFFERENTIAL/PLATELET
Abs Immature Granulocytes: 0.01 10*3/uL (ref 0.00–0.07)
Basophils Absolute: 0 10*3/uL (ref 0.0–0.1)
Basophils Relative: 0 %
Eosinophils Absolute: 0.1 10*3/uL (ref 0.0–0.5)
Eosinophils Relative: 1 %
HCT: 35.2 % — ABNORMAL LOW (ref 36.0–46.0)
Hemoglobin: 11.1 g/dL — ABNORMAL LOW (ref 12.0–15.0)
Immature Granulocytes: 0 %
Lymphocytes Relative: 35 %
Lymphs Abs: 2.3 10*3/uL (ref 0.7–4.0)
MCH: 24.2 pg — ABNORMAL LOW (ref 26.0–34.0)
MCHC: 31.5 g/dL (ref 30.0–36.0)
MCV: 76.9 fL — ABNORMAL LOW (ref 80.0–100.0)
Monocytes Absolute: 0.4 10*3/uL (ref 0.1–1.0)
Monocytes Relative: 6 %
Neutro Abs: 3.8 10*3/uL (ref 1.7–7.7)
Neutrophils Relative %: 58 %
Platelets: 403 10*3/uL — ABNORMAL HIGH (ref 150–400)
RBC: 4.58 MIL/uL (ref 3.87–5.11)
RDW: 14.5 % (ref 11.5–15.5)
WBC: 6.5 10*3/uL (ref 4.0–10.5)
nRBC: 0 % (ref 0.0–0.2)

## 2024-02-28 LAB — TROPONIN I (HIGH SENSITIVITY)
Troponin I (High Sensitivity): 3 ng/L (ref <18)
Troponin I (High Sensitivity): <2 ng/L (ref <18)

## 2024-02-28 LAB — URINALYSIS, ROUTINE W REFLEX MICROSCOPIC
Bilirubin Urine: NEGATIVE
Glucose, UA: NEGATIVE mg/dL
Hgb urine dipstick: NEGATIVE
Ketones, ur: 5 mg/dL — AB
Nitrite: NEGATIVE
Protein, ur: NEGATIVE mg/dL
Specific Gravity, Urine: 1.021 (ref 1.005–1.030)
pH: 7 (ref 5.0–8.0)

## 2024-02-28 LAB — LIPASE, BLOOD: Lipase: 22 U/L (ref 11–51)

## 2024-02-28 MED ORDER — PROMETHAZINE HCL 25 MG PO TABS
25.0000 mg | ORAL_TABLET | Freq: Once | ORAL | Status: DC
  Filled 2024-02-28: qty 1

## 2024-02-28 MED ORDER — SODIUM CHLORIDE 0.9 % IV SOLN
12.5000 mg | Freq: Once | INTRAVENOUS | Status: AC
Start: 2024-02-28 — End: 2024-02-28
  Administered 2024-02-28: 12.5 mg via INTRAVENOUS
  Filled 2024-02-28: qty 12.5

## 2024-02-28 MED ORDER — ONDANSETRON HCL 4 MG PO TABS: 4.0000 mg | ORAL_TABLET | Freq: Three times a day (TID) | ORAL | 0 refills | Status: DC | PRN

## 2024-02-28 NOTE — Discharge Instructions (Addendum)
 Your symptom is likely due to medication side effect from taking Wegovy .  It may take a period of time for the symptoms to improve as this medication stays in your system for at least several days.  You may take Zofran  as needed for nausea and follow-up closely with your doctor for further care.

## 2024-02-28 NOTE — ED Notes (Signed)
 Pt PO challenged per Garland Junk PA. Pt able to eat crackers and drink shasta with no complications

## 2024-02-28 NOTE — ED Triage Notes (Signed)
 Pt states she took wygovy 2.4 last night, has been vomiting since; states it's her first time taking that dose; endorses chest tightness, abd pain, and sob today; took zofran  2x pta without improvement; denies diarrhea, denies fevers

## 2024-02-28 NOTE — ED Provider Triage Note (Signed)
 Emergency Medicine Provider Triage Evaluation Note  Kristine Perez , a 33 y.o. female  was evaluated in triage.  Pt complains of nausea vomiting, upper abdominal pain, chest pain and back pain.  Patient started taking increased dose of Wegovy  for weight loss yesterday.  Has made keep things down event with ODT Zofran .  She states that she was not so worried about the nausea and vomiting but when she started having chest pain with that that has been constant she was worried.  She denies shortness of breath.  She denies exacerbating relieving factors.  Review of Systems  Positive: As above  Negative: As above  Physical Exam  BP 124/84 (BP Location: Right Arm)   Pulse 96   Temp 98.5 F (36.9 C)   Resp 20   LMP 02/18/2024 (Approximate)   SpO2 97%  Gen:   Awake, no distress   Resp:  Normal effort  MSK:   Moves extremities without difficulty  Other:    Medical Decision Making  Medically screening exam initiated at 3:15 PM.  Appropriate orders placed.  Kristine Perez was informed that the remainder of the evaluation will be completed by another provider, this initial triage assessment does not replace that evaluation, and the importance of remaining in the ED until their evaluation is complete.     Aimee Houseman, PA-C 02/28/24 1517

## 2024-02-28 NOTE — ED Provider Notes (Signed)
 [place] EMERGENCY DEPARTMENT AT [facility] Provider Note   CSN: [id number] Arrival date & time: 02/28/24  1429     History  No chief complaint on file.   Kristine Perez is a 33 y.o. female.  The history is provided by the patient and medical records. No language interpreter was used.     33 year old female history of obesity, anemia, cholelithiasis, prior surgical history including appendectomy presenting with complaints of nausea vomiting.  Patient states she took Wegovy  2.4 mg last night and since then she has been vomiting.  States this is her first time taking the increased dose.  She started the case in in February and was taking 1.2 mg.  She took a month off and now just resume the medication at a higher dose.  Despite taking Zofran  at home her symptoms still persist.  She endorsed abdominal discomfort and chest tightness along with shortness of breath.  She does not endorse any fever or chills denies any constipation or diarrhea.  Home Medications Prior to Admission medications   Medication Sig Start Date End Date Taking? Authorizing Provider  acetaminophen  (TYLENOL ) 500 MG tablet Take by mouth. 02/24/22   [provider]  clindamycin  (CLEOCIN  T) 1 % lotion Apply topically 2 (two) times daily. 02/06/24   Gabrielle Joiner, MD  clindamycin -benzoyl peroxide (BENZACLIN) gel APPLY TO AFFECTED AREA TWICE A DAY 06/15/23   Adelia Homestead, MD  cyclobenzaprine  (FLEXERIL ) 10 MG tablet Take 1 tablet (10 mg total) by mouth 3 (three) times daily as needed for muscle spasms. 01/12/23   Adelia Homestead, MD  Erenumab -aooe (AIMOVIG ) 140 MG/ML SOAJ Inject 140 mg into the skin every 30 (thirty) days. 10/18/23   Adelia Homestead, MD  Ferric Maltol  (ACCRUFER ) 30 MG CAPS Take 1 capsule (30 mg total) by mouth 2 (two) times daily before a meal. Take 2 hrs before, or 2 hrs after a meal. 02/06/24   Gabrielle Joiner, MD  ibuprofen  (ADVIL ) 800 MG tablet Take 1 tablet  (800 mg total) by mouth every 8 (eight) hours as needed. Patient not taking: Reported on 02/24/2024 10/18/23   Adelia Homestead, MD  ibuprofen  (ADVIL ) 800 MG tablet Take 1 tablet (800 mg total) by mouth every 8 (eight) hours as needed. 02/06/24   Gabrielle Joiner, MD  ketoconazole  (NIZORAL ) 2 % cream APPLY 1 APPLICATION TOPICALLY DAILY 01/03/24   Adelia Homestead, MD  metroNIDAZOLE  (FLAGYL ) 500 MG tablet Take 1 tablet (500 mg total) by mouth 2 (two) times daily. Patient not taking: Reported on 02/24/2024 10/26/23   Cresenzo, John V, MD  metroNIDAZOLE  (FLAGYL ) 500 MG tablet Take 1 tablet (500 mg total) by mouth 2 (two) times daily. 02/09/24   Gabrielle Joiner, MD  METRONIDAZOLE , TOPICAL, 0.75 % LOTN Use topically daily 10/18/23   Adelia Homestead, MD  mupirocin  ointment (BACTROBAN ) 2 % APPLY 1 APPLICATION TOPICALLY DAILY 01/03/24   Adelia Homestead, MD  nystatin  (MYCOSTATIN /NYSTOP ) powder Apply 1 Application topically 2 (two) times daily. For stomach Patient not taking: Reported on 02/24/2024 04/19/22   Adelia Homestead, MD  pantoprazole (PROTONIX) 40 MG tablet Take 1 tablet (40 mg total) by mouth daily. 02/24/24   Adelia Homestead, MD  Prenat-Fe Poly-Methfol-FA-DHA (VITAFOL  ULTRA) 29-0.6-0.4-200 MG CAPS Take 1 capsule by mouth daily before breakfast. 02/06/24   Gabrielle Joiner, MD  Semaglutide -Weight Management (WEGOVY ) 0.25 MG/0.5ML SOAJ INJECT 0.25 MG INTO THE SKIN ONCE A WEEK FOR 28 DAYS Patient not  taking: Reported on 02/24/2024 01/03/24   Adelia Homestead, MD  Semaglutide -Weight Management 2.4 MG/0.75ML SOAJ Inject 2.4 mg into the skin once a week for 28 days. 02/11/24 03/10/24  Adelia Homestead, MD  SUMAtriptan  (IMITREX ) 100 MG tablet TAKE 1 TABLET (100 MG TOTAL) BY MOUTH EVERY 2 (TWO) HOURS AS NEEDED FOR MIGRAINE. MAY REPEAT IN 2 HOURS IF HEADACHE PERSISTS OR RECURS. 01/03/24   Adelia Homestead, MD  tiZANidine  (ZANAFLEX ) 4 MG tablet Take 1 tablet (4 mg total) by mouth  every 6 (six) hours as needed for muscle spasms. 02/24/24   Adelia Homestead, MD      Allergies    Bee pollen    Review of Systems   Review of Systems  All other systems reviewed and are negative.   Physical Exam Updated Vital Signs BP 124/84 (BP Location: Right Arm)   Pulse 96   Temp 98.5 F (36.9 C)   Resp 20   LMP 02/18/2024 (Approximate)   SpO2 97%  Physical Exam Vitals and nursing note reviewed.  Constitutional:      General: She is not in acute distress.    Appearance: She is well-developed. She is obese.  HENT:     Head: Atraumatic.  Eyes:     Conjunctiva/sclera: Conjunctivae normal.  Cardiovascular:     Rate and Rhythm: Normal rate and regular rhythm.     Pulses: Normal pulses.     Heart sounds: Normal heart sounds.  Pulmonary:     Effort: Pulmonary effort is normal.  Abdominal:     Palpations: Abdomen is soft.     Tenderness: There is abdominal tenderness (Mild generalized tenderness no guarding no rebound tenderness).  Musculoskeletal:     Cervical back: Neck supple.  Skin:    Findings: No rash.  Neurological:     Mental Status: She is alert.  Psychiatric:        Mood and Affect: Mood normal.     ED Results / Procedures / Treatments   Labs (all labs ordered are listed, but only abnormal results are displayed) Labs Reviewed  CBC WITH DIFFERENTIAL/PLATELET - Abnormal; Notable for the following components:      Result Value   Hemoglobin 11.1 (*)    HCT 35.2 (*)    MCV 76.9 (*)    MCH 24.2 (*)    Platelets 403 (*)    All other components within normal limits  COMPREHENSIVE METABOLIC PANEL WITH GFR - Abnormal; Notable for the following components:   Alkaline Phosphatase 35 (*)    All other components within normal limits  URINALYSIS, ROUTINE W REFLEX MICROSCOPIC - Abnormal; Notable for the following components:   APPearance HAZY (*)    Ketones, ur 5 (*)    Leukocytes,Ua SMALL (*)    Bacteria, UA RARE (*)    All other components within  normal limits  LIPASE, BLOOD  TROPONIN I (HIGH SENSITIVITY)  TROPONIN I (HIGH SENSITIVITY)    EKG None  Date: 02/28/2024  Rate: 81  Rhythm: normal sinus rhythm  QRS Axis: normal  Intervals: normal  ST/T Wave abnormalities: normal  Conduction Disutrbances: none  Narrative Interpretation:   Old EKG Reviewed: No significant changes noted    Radiology DG Chest 2 View Result Date: 02/28/2024 CLINICAL DATA:  Chest pain EXAM: CHEST - 2 VIEW COMPARISON:  10/28/2016 chest radiograph. FINDINGS: Stable cardiomediastinal silhouette with normal heart size. No pneumothorax. No pleural effusion. Lungs appear clear, with no acute consolidative airspace disease and no pulmonary edema.  IMPRESSION: No active cardiopulmonary disease. Electronically Signed   By: Levell Reach M.D.   On: 02/28/2024 17:35    Procedures Procedures    Medications Ordered in ED Medications  promethazine  (PHENERGAN ) 12.5 mg in sodium chloride  0.9 % 50 mL IVPB (0 mg Intravenous Stopped 02/28/24 2149)    ED Course/ Medical Decision Making/ A&P                                 Medical Decision Making  BP 124/84 (BP Location: Right Arm)   Pulse 96   Temp 98.5 F (36.9 C)   Resp 20   LMP 02/18/2024 (Approximate)   SpO2 97%   91:41 PM  33 year old female history of obesity, anemia, cholelithiasis, prior surgical history including appendectomy presenting with complaints of nausea vomiting.  Patient states she took Wegovy  2.4 mg last night and since then she has been vomiting.  States this is her first time taking the increased dose.  She started the case in in February and was taking 1.2 mg.  She took a month off and now just resume the medication at a higher dose.  Despite taking Zofran  at home her symptoms still persist.  She endorsed abdominal discomfort and chest tightness along with shortness of breath.  She does not endorse any fever or chills denies any constipation or diarrhea.  On exam, patient resting  comfortably appears to be in no acute discomfort.  She does have some mild generalized abdominal tenderness without focal point tenderness.  Vital signs overall reassuring no fever no hypoxia.  -Labs ordered, independently viewed and interpreted by me.  Labs remarkable for overall reassuring labs -The patient was maintained on a cardiac monitor.  I personally viewed and interpreted the cardiac monitored which showed an underlying rhythm of: Normal sinus rhythm -Imaging including abdominal pelvis CT scan considered but not performed as abdominal exam overall reassuring and her symptom is likely medication side effect and less likely to be infectious etiology -This patient presents to the ED for concern of nausea, this involves an extensive number of treatment options, and is a complaint that carries with it a high risk of complications and morbidity.  The differential diagnosis includes expected medication side effect, GI illness, medication intolerance, stress response -Co morbidities that complicate the patient evaluation includes obesity, cholelithiasis -Treatment includes Phenergan  -Reevaluation of the patient after these medicines showed that the patient improved -PCP office notes or outside notes reviewed -Escalation to admission/observation considered: patients feels a bit better, is comfortable with discharge, and will follow up with PCP -Prescription medication considered, patient comfortable with zofran  -Social Determinant of Health considered which includes housing and car insecurity, stress         Final Clinical Impression(s) / ED Diagnoses Final diagnoses:  Medication side effect  Nausea and vomiting, unspecified vomiting type    Rx / DC Orders ED Discharge Orders          Ordered    ondansetron  (ZOFRAN ) 4 MG tablet  Every 8 hours PRN        02/28/24 2257              Debbra Fairy, PA-C 02/28/24 2258    Rolinda Climes, DO 02/29/24 0009

## 2024-03-01 ENCOUNTER — Ambulatory Visit: Admitting: Licensed Clinical Social Worker

## 2024-03-01 DIAGNOSIS — R69 Illness, unspecified: Secondary | ICD-10-CM

## 2024-03-04 ENCOUNTER — Other Ambulatory Visit: Payer: Self-pay | Admitting: Internal Medicine

## 2024-03-06 NOTE — BH Specialist Note (Signed)
 Patient connected virtually and advised she would like to reschedule today's appointment. Patient was in the hospital with her daughter and advised her daughter was admitted today and she's unable to meet with today's visit. Session was rescheduled for 03/08/2024 at 8:15.

## 2024-03-08 ENCOUNTER — Encounter: Admitting: Licensed Clinical Social Worker

## 2024-03-21 ENCOUNTER — Ambulatory Visit: Admitting: Plastic Surgery

## 2024-03-25 ENCOUNTER — Emergency Department (HOSPITAL_COMMUNITY)
Admit: 2024-03-25 | Discharge: 2024-03-25 | Disposition: A | Discharge status: Home / Self Care | Attending: Emergency Medicine

## 2024-03-25 ENCOUNTER — Other Ambulatory Visit: Payer: Self-pay

## 2024-03-25 ENCOUNTER — Emergency Department (HOSPITAL_COMMUNITY)
Admission: EM | Admit: 2024-03-25 | Discharge: 2024-03-25 | Disposition: A | Discharge status: Home / Self Care | Attending: Emergency Medicine | Admitting: Emergency Medicine

## 2024-03-25 ENCOUNTER — Encounter (HOSPITAL_COMMUNITY): Payer: Self-pay

## 2024-03-25 DIAGNOSIS — M79604 Pain in right leg: Secondary | ICD-10-CM

## 2024-03-25 DIAGNOSIS — M79661 Pain in right lower leg: Secondary | ICD-10-CM | POA: Diagnosis present

## 2024-03-25 DIAGNOSIS — M7989 Other specified soft tissue disorders: Secondary | ICD-10-CM

## 2024-03-25 NOTE — ED Provider Notes (Addendum)
 [place] EMERGENCY DEPARTMENT AT [facility] Provider Note   CSN: [id number] Arrival date & time: 03/25/24  1141     Patient presents with: Joint Swelling   Kristine Perez is a 33 y.o. female.   33 year old female presenting with right lower extremity pain/swelling.  Symptoms began 2 days ago, patient was seen at urgent care this morning where they ordered a D-dimer that was elevated at 550, they encouraged her to come to the emergency department for a DVT study of the right lower extremity.  She reports that her right leg/calf feels tight when she tries to bend at the knee.  Denies chest pain, shortness of breath, oral contraceptive use, no history of PE/DVT, no recent trauma, patient did go to [place]  several weeks ago which involved a 4-hour car ride but denies other extended travel.        Prior to Admission medications   Medication Sig Start Date End Date Taking? Authorizing Provider  acetaminophen  (TYLENOL ) 500 MG tablet Take by mouth. 02/24/22   [provider]  clindamycin  (CLEOCIN  T) 1 % lotion Apply topically 2 (two) times daily. 02/06/24   Rudy Carlin LABOR, MD  clindamycin -benzoyl peroxide (BENZACLIN) gel APPLY TO AFFECTED AREA TWICE A DAY 06/15/23   Rollene Almarie LABOR, MD  cyclobenzaprine  (FLEXERIL ) 10 MG tablet Take 1 tablet (10 mg total) by mouth 3 (three) times daily as needed for muscle spasms. 01/12/23   Rollene Almarie LABOR, MD  Erenumab -aooe (AIMOVIG ) 140 MG/ML SOAJ Inject 140 mg into the skin every 30 (thirty) days. 10/18/23   Rollene Almarie LABOR, MD  Ferric Maltol  (ACCRUFER ) 30 MG CAPS Take 1 capsule (30 mg total) by mouth 2 (two) times daily before a meal. Take 2 hrs before, or 2 hrs after a meal. 02/06/24   Rudy Carlin LABOR, MD  ibuprofen  (ADVIL ) 800 MG tablet Take 1 tablet (800 mg total) by mouth every 8 (eight) hours as needed. Patient not taking: Reported on 02/24/2024 10/18/23   Rollene Almarie LABOR, MD  ibuprofen  (ADVIL ) 800 MG  tablet Take 1 tablet (800 mg total) by mouth every 8 (eight) hours as needed. 02/06/24   Rudy Carlin LABOR, MD  ketoconazole  (NIZORAL ) 2 % cream APPLY 1 APPLICATION TOPICALLY DAILY 01/03/24   Rollene Almarie LABOR, MD  metroNIDAZOLE  (FLAGYL ) 500 MG tablet Take 1 tablet (500 mg total) by mouth 2 (two) times daily. Patient not taking: Reported on 02/24/2024 10/26/23   Cresenzo, John V, MD  metroNIDAZOLE  (FLAGYL ) 500 MG tablet Take 1 tablet (500 mg total) by mouth 2 (two) times daily. 02/09/24   Rudy Carlin LABOR, MD  METRONIDAZOLE , TOPICAL, 0.75 % LOTN Use topically daily 10/18/23   Rollene Almarie LABOR, MD  mupirocin  ointment (BACTROBAN ) 2 % APPLY 1 APPLICATION TOPICALLY DAILY 01/03/24   Rollene Almarie LABOR, MD  nystatin  (MYCOSTATIN /NYSTOP ) powder Apply 1 Application topically 2 (two) times daily. For stomach Patient not taking: Reported on 02/24/2024 04/19/22   Rollene Almarie LABOR, MD  ondansetron  (ZOFRAN ) 4 MG tablet Take 1 tablet (4 mg total) by mouth every 8 (eight) hours as needed. 02/28/24   Nivia Colon, PA-C  pantoprazole  (PROTONIX ) 40 MG tablet Take 1 tablet (40 mg total) by mouth daily. 02/24/24   Rollene Almarie LABOR, MD  Prenat-Fe Poly-Methfol-FA-DHA (VITAFOL  ULTRA) 29-0.6-0.4-200 MG CAPS Take 1 capsule by mouth daily before breakfast. 02/06/24   Rudy Carlin LABOR, MD  Semaglutide -Weight Management (WEGOVY ) 0.25 MG/0.5ML SOAJ INJECT 0.25 MG INTO THE SKIN ONCE A WEEK FOR 28 DAYS Patient  not taking: Reported on 02/24/2024 01/03/24   Rollene Almarie LABOR, MD  Semaglutide -Weight Management (WEGOVY ) 2.4 MG/0.75ML SOAJ INJECT 2.4 MG INTO THE SKIN ONCE A WEEK FOR 28 DAYS. 03/06/24   Rollene Almarie LABOR, MD  SUMAtriptan  (IMITREX ) 100 MG tablet TAKE 1 TABLET (100 MG TOTAL) BY MOUTH EVERY 2 (TWO) HOURS AS NEEDED FOR MIGRAINE. MAY REPEAT IN 2 HOURS IF HEADACHE PERSISTS OR RECURS. 01/03/24   Rollene Almarie LABOR, MD  tiZANidine  (ZANAFLEX ) 4 MG tablet Take 1 tablet (4 mg total) by mouth every 6 (six) hours as needed  for muscle spasms. 02/24/24   Rollene Almarie LABOR, MD    Allergies: Bee pollen    Review of Systems  Updated Vital Signs BP (!) 139/92 (BP Location: Right Arm)   Pulse 76   Temp 98.3 F (36.8 C) (Oral)   Resp 18   Ht 5' 6 (1.676 m)   Wt 127 kg   LMP 02/18/2024 (Approximate)   SpO2 100%   BMI 45.19 kg/m   Physical Exam Vitals and nursing note reviewed.  HENT:     Head: Normocephalic.   Eyes:     Extraocular Movements: Extraocular movements intact.     Pupils: Pupils are equal, round, and reactive to light.    Cardiovascular:     Pulses:          Dorsalis pedis pulses are 2+ on the right side and 2+ on the left side.  Pulmonary:     Effort: Pulmonary effort is normal.   Musculoskeletal:     Cervical back: Normal range of motion.     Comments: Right thigh is mildly circumferentially larger as compared to the left, no obvious erythema or warmth, no tenderness to palpation, full range of motion however patient complains of tightness when bending at the knee, no pitting edema    Skin:    General: Skin is warm and dry.   Neurological:     Mental Status: She is alert.     (all labs ordered are listed, but only abnormal results are displayed) Labs Reviewed - No data to display  EKG: EKG Interpretation Date/Time:  Sunday March 25 2024 15:05:17 EDT Ventricular Rate:  64 PR Interval:  159 QRS Duration:  88 QT Interval:  444 QTC Calculation: 459 R Axis:   42  Text Interpretation: Sinus rhythm ST elev, probable normal early repol pattern Confirmed by Bari Flank [phone]) on 03/25/2024 3:09:23 PM  Radiology: VAS US  LOWER EXTREMITY VENOUS (DVT) (7a-7p) Result Date: 03/25/2024  Lower Venous DVT Study Patient Name:  Kristine Perez  Date of Exam:   03/25/2024 Medical Rec #: [id number]           Accession #:    [id number] Date of Birth: May 14, 1991           Patient Gender: F Patient Age:   14 years Exam Location:  [facility] Procedure:      VAS US  LOWER  EXTREMITY VENOUS (DVT) Referring Phys: ROCKY Geovannie Vilar --------------------------------------------------------------------------------  Indications: Swelling.  Risk Factors: None identified. Limitations: Poor ultrasound/tissue interface and body habitus. Comparison Study: No prior studies. Performing Technologist: Cordella Collet RVT  Examination Guidelines: A complete evaluation includes B-mode imaging, spectral Doppler, color Doppler, and power Doppler as needed of all accessible portions of each vessel. Bilateral testing is considered an integral part of a complete examination. Limited examinations for reoccurring indications may be performed as noted. The reflux portion of the exam is performed with the patient in reverse Trendelenburg.  +---------+---------------+---------+-----------+----------+--------------+  RIGHT    CompressibilityPhasicitySpontaneityPropertiesThrombus Aging +---------+---------------+---------+-----------+----------+--------------+ CFV      Full           Yes      Yes                                 +---------+---------------+---------+-----------+----------+--------------+ SFJ      Full                                                        +---------+---------------+---------+-----------+----------+--------------+ FV Prox  Full                                                        +---------+---------------+---------+-----------+----------+--------------+ FV Mid   Full                                                        +---------+---------------+---------+-----------+----------+--------------+ FV Distal               Yes      Yes                                 +---------+---------------+---------+-----------+----------+--------------+ PFV      Full                                                        +---------+---------------+---------+-----------+----------+--------------+ POP      Full           Yes      Yes                                  +---------+---------------+---------+-----------+----------+--------------+ PTV      Full                                                        +---------+---------------+---------+-----------+----------+--------------+ PERO     Full                                                        +---------+---------------+---------+-----------+----------+--------------+   +----+---------------+---------+-----------+----------+--------------+ LEFTCompressibilityPhasicitySpontaneityPropertiesThrombus Aging +----+---------------+---------+-----------+----------+--------------+ CFV Full           Yes      Yes                                 +----+---------------+---------+-----------+----------+--------------+  Summary: RIGHT: - There is no evidence of deep vein thrombosis in the lower extremity. However, portions of this examination were limited- see technologist comments above.  - No cystic structure found in the popliteal fossa.  LEFT: - No evidence of common femoral vein obstruction.   *See table(s) above for measurements and observations.    Preliminary      Procedures   Medications Ordered in the ED - No data to display                                  Medical Decision Making This patient presents to the ED for concern of elevated D-dimer, leg pain/swelling, this involves an extensive number of treatment options, and is a complaint that carries with it a high risk of complications and morbidity.  The differential diagnosis includes DVT, PE, musculoskeletal pain/strain/sprain.   Co morbidities that complicate the patient evaluation  Obesity, pre-DM   Additional history obtained:  Additional history obtained from record review External records from outside source obtained and reviewed including  urgent care note from today, prior ED note   Lab Tests:  I Ordered, and personally interpreted labs.  The pertinent results include: Reviewed D-dimer from  urgent care, was elevated at 550.   Imaging Studies ordered:  I ordered imaging studies including right lower extremity DVT ultrasound study I independently visualized and interpreted imaging which showed Summary:  RIGHT:  - There is no evidence of deep vein thrombosis in the lower extremity.  However, portions of this examination were limited- see technologist  comments above.   - No cystic structure found in the popliteal fossa.   LEFT:  - No evidence of common femoral vein obstruction.   I agree with the radiologist interpretation   Cardiac Monitoring: / EKG:  The patient was maintained on a cardiac monitor.  I personally viewed and interpreted the cardiac monitored which showed an underlying rhythm of: NSR   Problem List / ED Course / Critical interventions / Medication management I have reviewed the patients home medicines and have made adjustments as needed   Social Determinants of Health:  Housing instability, unmet transportation needs, depression   Test / Admission - Considered:  See above for physical exam findings.  DVT ultrasound results are reassuring, DVT negative in the right lower extremity.  Vitals are reassuring, patient is not tachycardic, she is maintaining her oxygen saturation on room air without difficulty, she has not experienced chest pain/shortness of breath and she has no other PE risk factors at this time, including no hemoptysis, no recent surgery/trauma, no history of PE or DVT, no oral contraceptive use.  No evidence of right heart strain on EKG.  I have a low suspicion for PE at this time, but did discuss positive D-dimer results in depth with patient, I discussed that despite this test being positive it is not specific to any particular diagnosis, and ultrasound findings are reassuring.  I encouraged the patient to follow-up with her primary care provider if she continues to have pains in her right lower extremity, she can take Tylenol /ibuprofen  as  needed for these discomforts.  She voiced understanding and is in agreement with this plan.  Return precautions discussed.  She is appropriate discharge at this time.          Final diagnoses:  Pain of right lower extremity    ED Discharge Orders     None  Glendia Rocky SAILOR, [place] 03/25/24 1549    Glendia Rocky SAILOR, PA-C 03/25/24 1636    Bari Roxie HERO, DO 03/25/24 2328

## 2024-03-25 NOTE — ED Triage Notes (Signed)
 Pt c/o right knee swelling. Pt went to UC today and they drew labs and said labs were elevated to go to ED for evaluation. Pt states she heard a pop in her knee two days ago as well.

## 2024-03-25 NOTE — Progress Notes (Signed)
 Right lower extremity venous duplex has been completed. Preliminary results can be found in CV Proc through chart review.  Results were given to Rocky Hamilton PA.  03/25/24 2:32 PM Cathlyn Collet RVT

## 2024-03-25 NOTE — Discharge Instructions (Addendum)
 Follow-up with your primary care provider if your leg pain persist, you can take Tylenol /ibuprofen  as needed.  Return to the emergency department if your symptoms worsen or if you develop chest pain or shortness of breath.

## 2024-06-20 ENCOUNTER — Ambulatory Visit: Admitting: Plastic Surgery

## 2024-06-20 ENCOUNTER — Encounter: Payer: Self-pay | Admitting: Plastic Surgery

## 2024-06-20 VITALS — BP 127/86 | HR 82 | Ht 67.0 in | Wt 274.4 lb

## 2024-06-20 DIAGNOSIS — M793 Panniculitis, unspecified: Secondary | ICD-10-CM | POA: Diagnosis not present

## 2024-06-20 NOTE — Progress Notes (Signed)
 Ms. Albino returns today approximately 2 years after I initially saw her regarding a panniculectomy.  She was turned down the first time we submitted her for the procedure due to not having had consistent medical therapy.  She was treated by her primary care and still had rashes on the posterior aspect of the pannus.  She also is well in excess of the maximum BMI of 40.  She has struggled with weight loss and states that she was on Wegovy  but did not tolerate it and ended up hospitalized while she was using it.  She is now working with her primary care provider on other medications and has also started with a weight loss coach who is teaching her proper aerobic and resistance training.  I will place a consult to the healthy weight and wellness program.  She will continue working on weight loss and will return in 3 months to follow her weight loss progress.  Once her BMI is under 40 we will work on resubmitting for surgery and scheduling her.

## 2024-06-27 ENCOUNTER — Encounter: Payer: Self-pay | Admitting: Physician Assistant

## 2024-06-27 ENCOUNTER — Ambulatory Visit: Admitting: Physician Assistant

## 2024-06-27 VITALS — BP 111/70 | HR 81

## 2024-06-27 DIAGNOSIS — L219 Seborrheic dermatitis, unspecified: Secondary | ICD-10-CM

## 2024-06-27 DIAGNOSIS — L732 Hidradenitis suppurativa: Secondary | ICD-10-CM

## 2024-06-27 MED ORDER — CLINDAMYCIN PHOS-BENZOYL PEROX 1-5 % EX GEL: Freq: Two times a day (BID) | CUTANEOUS | 6 refills | Status: DC

## 2024-06-27 MED ORDER — MUPIROCIN 2 % EX OINT: 1.0000 | TOPICAL_OINTMENT | Freq: Every day | CUTANEOUS | 6 refills | Status: DC

## 2024-06-27 MED ORDER — CLINDAMYCIN PHOS-BENZOYL PEROX 1-5 % EX GEL: CUTANEOUS | 6 refills | Status: AC

## 2024-06-27 MED ORDER — KETOCONAZOLE 2 % EX CREA: 1.0000 | TOPICAL_CREAM | Freq: Two times a day (BID) | CUTANEOUS | 0 refills | Status: DC

## 2024-06-27 MED ORDER — HYDROCORTISONE 2.5 % EX CREA: TOPICAL_CREAM | CUTANEOUS | 3 refills | Status: DC

## 2024-06-27 MED ORDER — MUPIROCIN 2 % EX OINT: TOPICAL_OINTMENT | CUTANEOUS | 6 refills | Status: DC

## 2024-06-27 MED ORDER — HYDROCORTISONE 2.5 % EX CREA: TOPICAL_CREAM | Freq: Two times a day (BID) | CUTANEOUS | 11 refills | Status: DC | PRN

## 2024-06-27 MED ORDER — KETOCONAZOLE 2 % EX CREA: 1.0000 | TOPICAL_CREAM | Freq: Two times a day (BID) | CUTANEOUS | 3 refills | Status: AC

## 2024-06-27 MED ORDER — CLINDAMYCIN PHOSPHATE 1 % EX LOTN: TOPICAL_LOTION | Freq: Two times a day (BID) | CUTANEOUS | 6 refills | Status: DC

## 2024-06-27 NOTE — Patient Instructions (Addendum)

## 2024-06-27 NOTE — Progress Notes (Signed)
 New Patient Visit   Subjective  Kristine Perez is a 33 y.o. female NEW PATIENT who presents for the following: possible hidradenitis suppurativa as well as dry flaking skin on face.   Patient states she has HS located in axillae and inguinal region. Duration now x 15 years. Patient reports that she washes with dial soap. Patient reports she has previously been treated for these areas patient reports that she has been using mupirocin  ointment and clindamycin  lotion and gel. Patient admits family history of HS on her dads side of the family    The following portions of the chart were reviewed this encounter and updated as appropriate: medications, allergies, medical history  Review of Systems:  No other skin or systemic complaints except as noted in HPI or Assessment and Plan.  Objective  Well appearing patient in no apparent distress; mood and affect are within normal limits.  A full examination was performed including scalp, head, eyes, ears, nose, lips, neck, chest, axillae, abdomen, back, buttocks, bilateral upper extremities, bilateral lower extremities, hands, feet, fingers, toes, fingernails, and toenails. All findings within normal limits unless otherwise noted below.   A focused examination was performed of the following areas: Scalp, face, axillae, infra-mammary and inguinal region.   Relevant exam findings are noted in the Assessment and Plan.    Assessment & Plan   SEBORRHEIC DERMATITIS Exam: Pink patches with greasy scale eyebrows and nasal sidewall   Seborrheic Dermatitis is a chronic persistent rash characterized by pinkness and scaling most commonly of the mid face but also can occur on the scalp (dandruff), ears; mid chest, mid back and groin.  It tends to be exacerbated by stress and cooler weather.  People who have neurologic disease may experience new onset or exacerbation of existing seborrheic dermatitis.  The condition is not curable but treatable and can  be controlled.  Treatment Plan: - start ketoconazole  and HC cream as directed    HIDRADENITIS SUPPURATIVA Exam: scar and open comedones   Well controlled   Hidradenitis Suppurativa is a chronic; persistent; non-curable, but treatable condition due to abnormal inflamed sweat glands in the body folds (axilla, inframammary, groin, medial thighs), causing recurrent painful draining cysts and scarring. It can be associated with severe scarring acne and cysts; also abscesses and scarring of scalp. The goal is control and prevention of flares, as it is not curable. Scars are permanent and can be thickened. Treatment may include daily use of topical medication and oral antibiotics.  Oral isotretinoin may also be helpful.  For some cases, Humira or Cosentyx (biologic injections) may be prescribed to decrease the inflammatory process and prevent flares.  When indicated, inflamed cysts may also be treated surgically.  Treatment Plan: - alternate 3 soaps to include Dial, Hibiclens and Pan Oxyl BP wash  - call for flares      HIDRADENITIS SUPPURATIVA   Related Medications clindamycin  (CLEOCIN  T) 1 % lotion Apply topically 2 (two) times daily. clindamycin -benzoyl peroxide (BENZACLIN) gel Apply topically 2 (two) times daily. mupirocin  ointment (BACTROBAN ) 2 % Apply 1 Application topically daily. SEBORRHEIC DERMATITIS   Related Medications hydrocortisone  2.5 % cream Apply topically 2 (two) times daily as needed (Rash). Apply MWF until clear ketoconazole  (NIZORAL ) 2 % cream Apply 1 Application topically 2 (two) times daily. Apply on the days Tuesdays and Thursdays  Return in about 6 months (around 12/25/2024) for HS follow up .  I, Doyce Pan, CMA, am acting as scribe for Yosselyn Tax K, PA-C.   Documentation:  I have reviewed the above documentation for accuracy and completeness, and I agree with the above.  Lynnley Doddridge K, PA-C

## 2024-07-10 ENCOUNTER — Encounter (INDEPENDENT_AMBULATORY_CARE_PROVIDER_SITE_OTHER): Payer: Self-pay

## 2024-07-24 ENCOUNTER — Other Ambulatory Visit (HOSPITAL_COMMUNITY): Payer: Self-pay

## 2024-09-18 NOTE — Progress Notes (Unsigned)
° °  Referring Provider Rollene Almarie LABOR, MD [address] [place],  [place] [address]   CC: No chief complaint on file.     Kristine Perez is an 33 y.o. female.  HPI: Patient is a 33 y.o. year old female here for follow up after weight loss prior to consideration for panniculectomy  She was seen for initial consult by Dr. Waddell 06/20/2024.  At that time, she weighed 274 pounds, higher than the 40 kg/m limit prior to consideration for panniculectomy.  She was working with her primary care provider as well as a weight loss coach.  She was also referred to healthy weight and wellness program.  Return in 3 months for follow-up.  Review of Systems General: Denies fevers MSK: Endorses ongoing back and neck discomfort Skin: Denies rashes ***  Physical Exam    06/27/2024    9:08 AM 06/20/2024    8:56 AM 03/25/2024   11:49 AM  Vitals with BMI  Height  5' 7 5' 6  Weight  274 lbs 6 oz 280 lbs  BMI  42.97 45.21  Systolic 111 127 860  Diastolic 70 86 92  Pulse 81 82 76    General:  No acute distress,  Alert and oriented, Non-Toxic, Normal speech and affect Psych: Normal behavior and mood Respiratory: No increased WOB MSK: Ambulatory  Assessment/Plan ***  Patient is interested in pursuing surgical intervention for bilateral breast reduction. Patient has completed at least 6 weeks of physical therapy for pain related to macromastia.  Discussed with patient we would submit to insurance for authorization, discussed approval could take up to 6 weeks.   Kristine Perez 09/18/2024, 9:36 AM

## 2024-09-19 ENCOUNTER — Ambulatory Visit: Admitting: Physician Assistant

## 2024-09-26 ENCOUNTER — Institutional Professional Consult (permissible substitution): Admitting: Bariatrics

## 2024-10-10 ENCOUNTER — Encounter (HOSPITAL_COMMUNITY): Payer: Self-pay

## 2024-10-10 ENCOUNTER — Other Ambulatory Visit: Payer: Self-pay

## 2024-10-10 ENCOUNTER — Emergency Department (HOSPITAL_COMMUNITY)
Admission: EM | Admit: 2024-10-10 | Discharge: 2024-10-11 | Disposition: A | Discharge status: Home / Self Care | Source: Ambulatory Visit | Attending: Emergency Medicine | Admitting: Emergency Medicine

## 2024-10-10 DIAGNOSIS — R1030 Lower abdominal pain, unspecified: Secondary | ICD-10-CM | POA: Insufficient documentation

## 2024-10-10 DIAGNOSIS — R109 Unspecified abdominal pain: Secondary | ICD-10-CM | POA: Diagnosis present

## 2024-10-10 LAB — URINALYSIS, ROUTINE W REFLEX MICROSCOPIC
Bacteria, UA: NONE SEEN
Bilirubin Urine: NEGATIVE
Glucose, UA: NEGATIVE mg/dL
Hgb urine dipstick: NEGATIVE
Ketones, ur: NEGATIVE mg/dL
Nitrite: NEGATIVE
Protein, ur: NEGATIVE mg/dL
Specific Gravity, Urine: 1.019 (ref 1.005–1.030)
pH: 7 (ref 5.0–8.0)

## 2024-10-10 LAB — CBC
HCT: 30.7 % — ABNORMAL LOW (ref 36.0–46.0)
Hemoglobin: 9.6 g/dL — ABNORMAL LOW (ref 12.0–15.0)
MCH: 23.9 pg — ABNORMAL LOW (ref 26.0–34.0)
MCHC: 31.3 g/dL (ref 30.0–36.0)
MCV: 76.6 fL — ABNORMAL LOW (ref 80.0–100.0)
Platelets: 332 K/uL (ref 150–400)
RBC: 4.01 MIL/uL (ref 3.87–5.11)
RDW: 14.6 % (ref 11.5–15.5)
WBC: 9.5 K/uL (ref 4.0–10.5)
nRBC: 0 % (ref 0.0–0.2)

## 2024-10-10 LAB — COMPREHENSIVE METABOLIC PANEL WITH GFR
ALT: 11 U/L (ref 0–44)
AST: 14 U/L — ABNORMAL LOW (ref 15–41)
Albumin: 4 g/dL (ref 3.5–5.0)
Alkaline Phosphatase: 47 U/L (ref 38–126)
Anion gap: 9 (ref 5–15)
BUN: 10 mg/dL (ref 6–20)
CO2: 26 mmol/L (ref 22–32)
Calcium: 9.2 mg/dL (ref 8.9–10.3)
Chloride: 104 mmol/L (ref 98–111)
Creatinine, Ser: 0.7 mg/dL (ref 0.44–1.00)
GFR, Estimated: >60 mL/min
Glucose, Bld: 153 mg/dL — ABNORMAL HIGH (ref 70–99)
Potassium: 3.9 mmol/L (ref 3.5–5.1)
Sodium: 139 mmol/L (ref 135–145)
Total Bilirubin: 0.3 mg/dL (ref 0.0–1.2)
Total Protein: 7.3 g/dL (ref 6.5–8.1)

## 2024-10-10 LAB — HCG, SERUM, QUALITATIVE: Preg, Serum: NEGATIVE

## 2024-10-10 LAB — LIPASE, BLOOD: Lipase: <10 U/L — ABNORMAL LOW (ref 11–51)

## 2024-10-10 NOTE — ED Triage Notes (Signed)
 Pt states that she has had constant pelvic/lower abdominal pain that she describes as pressure and sharp. Denies dysuria, but does feel like she is not completely emptying her bladder. Pt was seen at [facility] tonight and they said that her urinalysis was clean so they suggested that she come hear for US . Pt also reports left sided lower back pain, as well. Denies n/v/d

## 2024-10-11 ENCOUNTER — Emergency Department (HOSPITAL_COMMUNITY)

## 2024-10-11 LAB — WET PREP, GENITAL
Sperm: NONE SEEN
Trich, Wet Prep: NONE SEEN
WBC, Wet Prep HPF POC: >=10 — AB
Yeast Wet Prep HPF POC: NONE SEEN

## 2024-10-11 LAB — GC/CHLAMYDIA PROBE AMP (~~LOC~~) NOT AT ARMC
Chlamydia: NEGATIVE
Comment: NEGATIVE
Comment: NORMAL
Neisseria Gonorrhea: POSITIVE — AB

## 2024-10-11 MED ORDER — IOHEXOL 300 MG/ML  SOLN
100.0000 mL | Freq: Once | INTRAMUSCULAR | Status: AC | PRN
  Administered 2024-10-11: 100 mL via ORAL

## 2024-10-11 MED ORDER — IBUPROFEN 600 MG PO TABS: 600.0000 mg | ORAL_TABLET | Freq: Four times a day (QID) | ORAL | 0 refills | Status: AC | PRN

## 2024-10-11 MED ORDER — MORPHINE SULFATE (PF) 4 MG/ML IV SOLN
4.0000 mg | Freq: Once | INTRAVENOUS | Status: AC
  Administered 2024-10-11: 4 mg via INTRAVENOUS
  Filled 2024-10-11: qty 1

## 2024-10-11 MED ORDER — HYDROCODONE-ACETAMINOPHEN 5-325 MG PO TABS: 1.0000 | ORAL_TABLET | Freq: Four times a day (QID) | ORAL | 0 refills | Status: AC | PRN

## 2024-10-11 NOTE — Discharge Instructions (Addendum)
 As we discussed, no cause for your abdominal pain was identified but no serious condition or infection was identified as well. Take ibuprofen  as prescribed for pain. If needed, take Norco for severe pain. Follow up with your doctor for recheck in one week if pain is not completely resolved.   Return to the ED with any new or worsening symptoms at any time.

## 2024-10-11 NOTE — ED Provider Notes (Signed)
 "  EMERGENCY DEPARTMENT AT [facility] Provider Note   CSN: [id number] Arrival date & time: 10/10/24  2156     Patient presents with: Abdominal Pain   Kristine Perez is a 34 y.o. female.   Patient to ED from Urgent Care for evaluation of sharp, intense, constant abdominal pain for the past 3 days that is getting progressively worse. No fever.   The history is provided by the patient. No language interpreter was used.  Abdominal Pain      Prior to Admission medications  Medication Sig Start Date End Date Taking? Authorizing Provider  ibuprofen  (ADVIL ) 600 MG tablet Take 1 tablet (600 mg total) by mouth every 6 (six) hours as needed. 10/11/24  Yes Odell Balls, PA-C  acetaminophen  (TYLENOL ) 500 MG tablet Take by mouth. 02/24/22   [provider]  clindamycin  (CLEOCIN  T) 1 % lotion Apply topically 2 (two) times daily. 06/27/24   Sandridge, Brenda K, PA-C  clindamycin -benzoyl peroxide (BENZACLIN) gel Apply daily as needed. 06/27/24   Sandridge, Brenda K, PA-C  cyclobenzaprine  (FLEXERIL ) 10 MG tablet Take 1 tablet (10 mg total) by mouth 3 (three) times daily as needed for muscle spasms. 01/12/23   Rollene Almarie LABOR, MD  Erenumab -aooe (AIMOVIG ) 140 MG/ML SOAJ Inject 140 mg into the skin every 30 (thirty) days. 10/18/23   Rollene Almarie LABOR, MD  Ferric Maltol  (ACCRUFER ) 30 MG CAPS Take 1 capsule (30 mg total) by mouth 2 (two) times daily before a meal. Take 2 hrs before, or 2 hrs after a meal. 02/06/24   Rudy Carlin LABOR, MD  hydrocortisone  2.5 % cream Apply daily M, W, Fri 06/27/24   Sandridge, Brenda K, PA-C  ketoconazole  (NIZORAL ) 2 % cream APPLY 1 APPLICATION TOPICALLY DAILY 01/03/24   Rollene Almarie LABOR, MD  metroNIDAZOLE  (FLAGYL ) 500 MG tablet Take 1 tablet (500 mg total) by mouth 2 (two) times daily. 10/26/23   Cresenzo, John V, MD  metroNIDAZOLE  (FLAGYL ) 500 MG tablet Take 1 tablet (500 mg total) by mouth 2 (two) times daily. 02/09/24   Rudy Carlin LABOR, MD  METRONIDAZOLE , TOPICAL, 0.75 % LOTN Use topically daily Patient not taking: Reported on 06/27/2024 10/18/23   Rollene Almarie LABOR, MD  mupirocin  ointment (BACTROBAN ) 2 % Apply twice daily when flaring 06/27/24   Sandridge, Brenda K, PA-C  nystatin  (MYCOSTATIN /NYSTOP ) powder Apply 1 Application topically 2 (two) times daily. For stomach 04/19/22   Rollene Almarie LABOR, MD  ondansetron  (ZOFRAN ) 4 MG tablet Take 1 tablet (4 mg total) by mouth every 8 (eight) hours as needed. Patient not taking: Reported on 06/20/2024 02/28/24   Nivia Colon, PA-C  pantoprazole  (PROTONIX ) 40 MG tablet Take 1 tablet (40 mg total) by mouth daily. 02/24/24   Rollene Almarie LABOR, MD  Prenat-Fe Poly-Methfol-FA-DHA (VITAFOL  ULTRA) 29-0.6-0.4-200 MG CAPS Take 1 capsule by mouth daily before breakfast. 02/06/24   Rudy Carlin LABOR, MD  Semaglutide -Weight Management (WEGOVY ) 0.25 MG/0.5ML SOAJ INJECT 0.25 MG INTO THE SKIN ONCE A WEEK FOR 28 DAYS Patient not taking: Reported on 06/27/2024 01/03/24   Rollene Almarie LABOR, MD  Semaglutide -Weight Management (WEGOVY ) 2.4 MG/0.75ML SOAJ INJECT 2.4 MG INTO THE SKIN ONCE A WEEK FOR 28 DAYS. Patient not taking: Reported on 06/27/2024 03/06/24   Rollene Almarie LABOR, MD  SUMAtriptan  (IMITREX ) 100 MG tablet TAKE 1 TABLET (100 MG TOTAL) BY MOUTH EVERY 2 (TWO) HOURS AS NEEDED FOR MIGRAINE. MAY REPEAT IN 2 HOURS IF HEADACHE PERSISTS OR RECURS. 01/03/24   Rollene Almarie LABOR,  MD  tiZANidine  (ZANAFLEX ) 4 MG tablet Take 1 tablet (4 mg total) by mouth every 6 (six) hours as needed for muscle spasms. 02/24/24   Rollene Almarie LABOR, MD    Allergies: Bee pollen    Review of Systems  Gastrointestinal:  Positive for abdominal pain.    Updated Vital Signs BP (!) 150/78 (BP Location: Left Arm)   Pulse (!) 106   Temp 99.3 F (37.4 C) (Oral)   Resp 17   LMP 09/24/2024 (Approximate) Comment: negative HCG 10/10/24  SpO2 100%   Physical Exam  (all labs ordered are listed, but only abnormal  results are displayed) Labs Reviewed  WET PREP, GENITAL - Abnormal; Notable for the following components:      Result Value   Clue Cells Wet Prep HPF POC PRESENT (*)    WBC, Wet Prep HPF POC >=10 (*)    All other components within normal limits  LIPASE, BLOOD - Abnormal; Notable for the following components:   Lipase <10 (*)    All other components within normal limits  COMPREHENSIVE METABOLIC PANEL WITH GFR - Abnormal; Notable for the following components:   Glucose, Bld 153 (*)    AST 14 (*)    All other components within normal limits  CBC - Abnormal; Notable for the following components:   Hemoglobin 9.6 (*)    HCT 30.7 (*)    MCV 76.6 (*)    MCH 23.9 (*)    All other components within normal limits  URINALYSIS, ROUTINE W REFLEX MICROSCOPIC - Abnormal; Notable for the following components:   APPearance HAZY (*)    Leukocytes,Ua LARGE (*)    All other components within normal limits  HCG, SERUM, QUALITATIVE  GC/CHLAMYDIA PROBE AMP ([place]) NOT AT [facility]    EKG: None  Radiology: US  PELVIC COMPLETE W TRANSVAGINAL AND TORSION R/O Result Date: 10/11/2024 CLINICAL DATA:  Pelvic pain. EXAM: TRANSABDOMINAL AND TRANSVAGINAL ULTRASOUND OF PELVIS DOPPLER ULTRASOUND OF OVARIES TECHNIQUE: Both transabdominal and transvaginal ultrasound examinations of the pelvis were performed. Transabdominal technique was performed for global imaging of the pelvis including uterus, ovaries, adnexal regions, and pelvic cul-de-sac. It was necessary to proceed with endovaginal exam following the transabdominal exam to visualize the ovaries. Color and duplex Doppler ultrasound was utilized to evaluate blood flow to the ovaries. COMPARISON:  CT scan earlier same day FINDINGS: Uterus Measurements: 10.6 x 4.8 x 5.9 cm = volume: 158 mL. 19 mm posterior fundal subserosal fibroid evident. Endometrium Thickness: 11 mm.  No focal abnormality visualized. Right ovary Measurements: 4.5 x 2.6 x 2.5 cm = volume:  15.7 mL.  23 mm dominant follicle. Doppler: There is normal vascularity on color doppler examination. Spectral doppler arterial and venous waveforms are normal. Left ovary Measurements: 3.8 x 2.3 x 3.8 cm = volume: 17.4 mL. Normal appearance/no adnexal mass. Doppler: There is normal vascularity on color doppler examination. Spectral doppler arterial and venous waveforms are normal. Other findings Trace free fluid in the cul-de-sac. IMPRESSION: 1. No evidence for ovarian torsion.  No ovarian mass lesion. 2. 19 mm posterior fundal subserosal fibroid. 3. Trace free fluid in the cul-de-sac. Electronically Signed   By: Camellia Candle M.D.   On: 10/11/2024 05:17   CT ABDOMEN PELVIS W CONTRAST Result Date: 10/11/2024 EXAM: CT ABDOMEN AND PELVIS WITH CONTRAST 10/11/2024 02:30:24 AM TECHNIQUE: CT of the abdomen and pelvis was performed with the administration of 100 mL of iohexol  (OMNIPAQUE ) 300 MG/ML solution. Multiplanar reformatted images are provided for review. Automated  exposure control, iterative reconstruction, and/or weight-based adjustment of the mA/kV was utilized to reduce the radiation dose to as low as reasonably achievable. COMPARISON: None available. CLINICAL HISTORY: Hypogastric pain. FINDINGS: LOWER CHEST: No acute abnormality. LIVER: The liver is unremarkable. GALLBLADDER AND BILE DUCTS: Suspect small gallstones within the gallbladder. No biliary ductal dilatation. SPLEEN: No acute abnormality. PANCREAS: No acute abnormality. ADRENAL GLANDS: No acute abnormality. KIDNEYS, URETERS AND BLADDER: No stones in the kidneys or ureters. No hydronephrosis. No perinephric or periureteral stranding. Urinary bladder is unremarkable. GI AND BOWEL: Stomach demonstrates no acute abnormality. There is no bowel obstruction. PERITONEUM AND RETROPERITONEUM: No ascites. No free air. VASCULATURE: Aorta is normal in caliber. LYMPH NODES: No lymphadenopathy. REPRODUCTIVE ORGANS: No acute abnormality. BONES AND SOFT TISSUES: No acute  osseous abnormality. No focal soft tissue abnormality. IMPRESSION: 1. Suspected small gallstones within the gallbladder. 2. No acute findings. Electronically signed by: Franky Crease MD MD 10/11/2024 02:44 AM EST RP Workstation: [id number]     Procedures   Medications Ordered in the ED  morphine  (PF) 4 MG/ML injection 4 mg (4 mg Intravenous Given 10/11/24 0157)  iohexol  (OMNIPAQUE ) 300 MG/ML solution 100 mL (100 mLs Oral Contrast Given 10/11/24 0218)    Clinical Course as of 10/11/24 0548  Thu Oct 11, 2024  0319 Patient presented with sharp, intense hypogastric abdominal pain that extended to pelvis. No fever. Labs are reassuring, pregnancy negative. Found to be tender to lower abdomen and on pelvic exam with CMT and bilateral adnexal tenderness. CT done for evaluation and is negative. Pelvic US  ordered to complete the evaluation. [SU]  0321 US  available in the morning at 6:00. Patient is agreeable to wait and is felt stable to do so. [SU]  0542 Evidence of BV on Wet prep. Us  negative. Per radiology interpretation:  IMPRESSION: 1. No evidence for ovarian torsion.  No ovarian mass lesion. 2. 19 mm posterior fundal subserosal fibroid. 3. Trace free fluid in the cul-de-sac.  I do not feel the small fibroid or BV is the cause of her pain. No serious condition identified. She has not needed ongoing pain relief. No vomiting or fever. She is felt appropriate for discharge home.  [SU]    Clinical Course User Index [SU] Odell Balls, PA-C                                 Medical Decision Making Amount and/or Complexity of Data Reviewed Labs: ordered. Radiology: ordered.  Risk Prescription drug management.        Final diagnoses:  Lower abdominal pain    ED Discharge Orders          Ordered    ibuprofen  (ADVIL ) 600 MG tablet  Every 6 hours PRN        10/11/24 0545               Odell Balls, PA-C 10/11/24 0548    Raford Lenis, MD 10/11/24 [phone]  "

## 2024-10-11 NOTE — ED Notes (Signed)
 Patient transported to CT

## 2024-10-12 ENCOUNTER — Encounter (HOSPITAL_COMMUNITY): Payer: Self-pay

## 2024-10-12 ENCOUNTER — Emergency Department (HOSPITAL_COMMUNITY): Admission: EM | Admit: 2024-10-12 | Discharge: 2024-10-13 | Disposition: A

## 2024-10-12 ENCOUNTER — Telehealth: Payer: Self-pay

## 2024-10-12 ENCOUNTER — Ambulatory Visit (HOSPITAL_COMMUNITY): Payer: Self-pay

## 2024-10-12 ENCOUNTER — Other Ambulatory Visit: Payer: Self-pay

## 2024-10-12 DIAGNOSIS — N760 Acute vaginitis: Secondary | ICD-10-CM | POA: Insufficient documentation

## 2024-10-12 DIAGNOSIS — K8020 Calculus of gallbladder without cholecystitis without obstruction: Secondary | ICD-10-CM | POA: Insufficient documentation

## 2024-10-12 DIAGNOSIS — R7989 Other specified abnormal findings of blood chemistry: Secondary | ICD-10-CM | POA: Insufficient documentation

## 2024-10-12 DIAGNOSIS — R1011 Right upper quadrant pain: Secondary | ICD-10-CM | POA: Diagnosis present

## 2024-10-12 DIAGNOSIS — B9689 Other specified bacterial agents as the cause of diseases classified elsewhere: Secondary | ICD-10-CM

## 2024-10-12 DIAGNOSIS — R079 Chest pain, unspecified: Secondary | ICD-10-CM

## 2024-10-12 LAB — CBC
HCT: 31.3 % — ABNORMAL LOW (ref 36.0–46.0)
Hemoglobin: 9.7 g/dL — ABNORMAL LOW (ref 12.0–15.0)
MCH: 23.4 pg — ABNORMAL LOW (ref 26.0–34.0)
MCHC: 31 g/dL (ref 30.0–36.0)
MCV: 75.6 fL — ABNORMAL LOW (ref 80.0–100.0)
Platelets: 329 K/uL (ref 150–400)
RBC: 4.14 MIL/uL (ref 3.87–5.11)
RDW: 14.8 % (ref 11.5–15.5)
WBC: 9.2 K/uL (ref 4.0–10.5)
nRBC: 0 % (ref 0.0–0.2)

## 2024-10-12 LAB — COMPREHENSIVE METABOLIC PANEL WITH GFR
ALT: 10 U/L (ref 0–44)
AST: 13 U/L — ABNORMAL LOW (ref 15–41)
Albumin: 3.9 g/dL (ref 3.5–5.0)
Alkaline Phosphatase: 52 U/L (ref 38–126)
Anion gap: 10 (ref 5–15)
BUN: 12 mg/dL (ref 6–20)
CO2: 28 mmol/L (ref 22–32)
Calcium: 9.2 mg/dL (ref 8.9–10.3)
Chloride: 101 mmol/L (ref 98–111)
Creatinine, Ser: 0.76 mg/dL (ref 0.44–1.00)
GFR, Estimated: >60 mL/min
Glucose, Bld: 146 mg/dL — ABNORMAL HIGH (ref 70–99)
Potassium: 3.5 mmol/L (ref 3.5–5.1)
Sodium: 138 mmol/L (ref 135–145)
Total Bilirubin: 0.4 mg/dL (ref 0.0–1.2)
Total Protein: 8 g/dL (ref 6.5–8.1)

## 2024-10-12 LAB — LIPASE, BLOOD: Lipase: 11 U/L (ref 11–51)

## 2024-10-12 LAB — HCG, SERUM, QUALITATIVE: Preg, Serum: NEGATIVE

## 2024-10-12 NOTE — ED Triage Notes (Signed)
 Says that she's having RUQ abdominal pains that manifested earlier today with subjective swelling.   Pain on inspiration. Not affected with movements.

## 2024-10-12 NOTE — Telephone Encounter (Signed)
 Copied from CRM [id number]. Topic: Clinical - Medical Advice >> Oct 12, 2024 11:39 AM Burnard DEL wrote: Reason for CRM: Patient called in stating that she tested  positive for gonorrhea in the ED and they advised her to reach out to her PC to get a shot for it.  She would ike to know when can she come in to get this done? Please advise.

## 2024-10-13 ENCOUNTER — Emergency Department (HOSPITAL_COMMUNITY)

## 2024-10-13 LAB — D-DIMER, QUANTITATIVE: D-Dimer, Quant: 2.23 ug{FEU}/mL — ABNORMAL HIGH (ref 0.00–0.50)

## 2024-10-13 MED ORDER — NAPROXEN 500 MG PO TABS: 500.0000 mg | ORAL_TABLET | Freq: Two times a day (BID) | ORAL | 0 refills | Status: AC

## 2024-10-13 MED ORDER — ONDANSETRON HCL 4 MG/2ML IJ SOLN
4.0000 mg | Freq: Once | INTRAMUSCULAR | Status: AC
  Administered 2024-10-13: 4 mg via INTRAVENOUS
  Filled 2024-10-13: qty 2

## 2024-10-13 MED ORDER — METRONIDAZOLE 0.75 % EX GEL: 1.0000 | Freq: Every day | CUTANEOUS | 0 refills | Status: AC

## 2024-10-13 MED ORDER — KETOROLAC TROMETHAMINE 15 MG/ML IJ SOLN
15.0000 mg | Freq: Once | INTRAMUSCULAR | Status: AC
  Administered 2024-10-13: 15 mg via INTRAVENOUS
  Filled 2024-10-13: qty 1

## 2024-10-13 MED ORDER — IOHEXOL 350 MG/ML SOLN
75.0000 mL | Freq: Once | INTRAVENOUS | Status: AC | PRN
  Administered 2024-10-13: 75 mL via INTRAVENOUS

## 2024-10-13 MED ORDER — MORPHINE SULFATE (PF) 4 MG/ML IV SOLN
4.0000 mg | Freq: Once | INTRAVENOUS | Status: AC
  Administered 2024-10-13: 4 mg via INTRAVENOUS
  Filled 2024-10-13: qty 1

## 2024-10-13 NOTE — ED Notes (Addendum)
 Patient sister called stating her sister was having chest pain in the lobby and she wanted some one to check on her. I transferred her to the triage nurse, minutes later she called back stating he was not help and she wanted to speak to a director or someone in charge I transferred her to the charge nurse she then called me back again minutes states she hung up the phone and was not helpful due to HIPAA but she violated HIPAA by telling her certain information. Patient sister said an EKG was not done. I insured her I would leave the desk to see if her sister was okay in the waiting room.   I brought the patient back to triage 1 and told the triage nurse and tech that she needed updated vital signs with an EKG.

## 2024-10-13 NOTE — Discharge Instructions (Addendum)
 You tested positive for BV 2 days ago, topical medication was sent to your pharmacy.   Lab work, EKG, chest x-ray and RUQ US  performed today with reassuring results. You do have stones in your gallbladder, which I suspect is causing your pain, please follow up with Dr Curvin for this to monitor symptoms. Try avoiding fatty/greasy meals as this could make symptoms worse.   Your CT scan that looks to rule out blood clots in your lungs does not show obvious pulmonary embolus (or blood clot). Unfortunately, due to timing of the contrast for the scan could miss a tiny clot. As discussed, we will hold off on blood thinner medication at this time. Please follow up with your primary care doctor for consideration of repeat imaging. Return to ED with new or worsening symptoms including shortness of breath, worsening chest pain, or lightheadedness/passing out.

## 2024-10-13 NOTE — ED Notes (Addendum)
 Pts sister called to complain. Repeatedly yelling at this nurse because her sister was waiting in the lobby and no one was checking on her. I assured her that the pt had been triaged, an EKG had been done, blood work has been drawn and pt has been evaluated. I also assured her that the pt was at no point was the pt left unattended. Sister insisted on berating me regarding pt care. Pt then called me back and stated she was recording everything and taking this to a higher level. Once informed that I was being recorded, call was terminated due to privacy and safety concerns.

## 2024-10-13 NOTE — ED Provider Notes (Signed)
 " [place] [facility] AT [facility] Provider Note   CSN: [id number] Arrival date & time: 10/12/24  2135     Patient presents with: Abdominal Pain   Kristine Perez is a 34 y.o. female.  Patient with past medical history of appendectomy, obesity presents to emergency room today with complaint of right upper quadrant abdominal pain.  Patient reports that this started 2 days ago and is associated with shortness of breath and pain when taking a deep breath in.   No swelling in calves, recent travel, recent surgery. No CP, cough, fever reported. No DVT/PE Hx. Was seen here 3 days ago for lower abdominal pain/pelvic pain, but this is different.     Abdominal Pain      Prior to Admission medications  Medication Sig Start Date End Date Taking? Authorizing Provider  metroNIDAZOLE  (METROGEL ) 0.75 % gel Apply 1 Application topically daily for 5 days. One application (5g) intravaginally, daily for 5 days. 10/13/24 10/18/24 Yes Breon Rehm N, PA-C  naproxen  (NAPROSYN ) 500 MG tablet Take 1 tablet (500 mg total) by mouth 2 (two) times daily. 10/13/24  Yes Jeriah Skufca, Warren SAILOR, PA-C  acetaminophen  (TYLENOL ) 500 MG tablet Take by mouth. 02/24/22   [provider]  clindamycin -benzoyl peroxide (BENZACLIN) gel Apply daily as needed. 06/27/24   Sandridge, Brenda K, PA-C  cyclobenzaprine  (FLEXERIL ) 10 MG tablet Take 1 tablet (10 mg total) by mouth 3 (three) times daily as needed for muscle spasms. 01/12/23   Rollene Almarie LABOR, MD  Erenumab -aooe (AIMOVIG ) 140 MG/ML SOAJ Inject 140 mg into the skin every 30 (thirty) days. 10/18/23   Rollene Almarie LABOR, MD  Ferric Maltol  (ACCRUFER ) 30 MG CAPS Take 1 capsule (30 mg total) by mouth 2 (two) times daily before a meal. Take 2 hrs before, or 2 hrs after a meal. 02/06/24   Rudy Carlin LABOR, MD  HYDROcodone -acetaminophen  (NORCO/VICODIN) 5-325 MG tablet Take 1 tablet by mouth every 6 (six) hours as needed for severe pain (pain score  7-10). 10/11/24   Odell Balls, PA-C  ibuprofen  (ADVIL ) 600 MG tablet Take 1 tablet (600 mg total) by mouth every 6 (six) hours as needed. 10/11/24   Odell Balls, PA-C  pantoprazole  (PROTONIX ) 40 MG tablet Take 1 tablet (40 mg total) by mouth daily. 02/24/24   Rollene Almarie LABOR, MD  Prenat-Fe Poly-Methfol-FA-DHA (VITAFOL  ULTRA) 29-0.6-0.4-200 MG CAPS Take 1 capsule by mouth daily before breakfast. 02/06/24   Rudy Carlin LABOR, MD  Semaglutide -Weight Management (WEGOVY ) 0.25 MG/0.5ML SOAJ INJECT 0.25 MG INTO THE SKIN ONCE A WEEK FOR 28 DAYS Patient not taking: Reported on 06/27/2024 01/03/24   Rollene Almarie LABOR, MD  Semaglutide -Weight Management (WEGOVY ) 2.4 MG/0.75ML SOAJ INJECT 2.4 MG INTO THE SKIN ONCE A WEEK FOR 28 DAYS. Patient not taking: Reported on 06/27/2024 03/06/24   Rollene Almarie LABOR, MD  SUMAtriptan  (IMITREX ) 100 MG tablet TAKE 1 TABLET (100 MG TOTAL) BY MOUTH EVERY 2 (TWO) HOURS AS NEEDED FOR MIGRAINE. MAY REPEAT IN 2 HOURS IF HEADACHE PERSISTS OR RECURS. 01/03/24   Rollene Almarie LABOR, MD  tiZANidine  (ZANAFLEX ) 4 MG tablet Take 1 tablet (4 mg total) by mouth every 6 (six) hours as needed for muscle spasms. 02/24/24   Rollene Almarie LABOR, MD    Allergies: Bee pollen    Review of Systems  Gastrointestinal:  Positive for abdominal pain.    Updated Vital Signs BP 131/88 (BP Location: Right Arm)   Pulse 82   Temp 98.6 F (37 C) (Oral)  Resp 16   Ht 5' 7 (1.702 m)   Wt 124.3 kg   LMP 09/24/2024 (Approximate) Comment: negative HCG 10/10/24  SpO2 100%   BMI 42.91 kg/m   Physical Exam Vitals and nursing note reviewed.  Constitutional:      General: She is not in acute distress.    Appearance: She is not toxic-appearing.  HENT:     Head: Normocephalic and atraumatic.  Eyes:     General: No scleral icterus.    Conjunctiva/sclera: Conjunctivae normal.  Cardiovascular:     Rate and Rhythm: Normal rate and regular rhythm.     Pulses: Normal pulses.     Heart sounds:  Normal heart sounds.  Pulmonary:     Effort: Pulmonary effort is normal. No respiratory distress.     Breath sounds: Normal breath sounds.  Abdominal:     General: Abdomen is flat. Bowel sounds are normal.     Palpations: Abdomen is soft.     Tenderness: There is no abdominal tenderness.   Skin:    General: Skin is warm and dry.     Findings: No lesion.  Neurological:     General: No focal deficit present.     Mental Status: She is alert and oriented to person, place, and time. Mental status is at baseline.     (all labs ordered are listed, but only abnormal results are displayed) Labs Reviewed  COMPREHENSIVE METABOLIC PANEL WITH GFR - Abnormal; Notable for the following components:      Result Value   Glucose, Bld 146 (*)    AST 13 (*)    All other components within normal limits  CBC - Abnormal; Notable for the following components:   Hemoglobin 9.7 (*)    HCT 31.3 (*)    MCV 75.6 (*)    MCH 23.4 (*)    All other components within normal limits  D-DIMER, QUANTITATIVE (NOT AT [facility]) - Abnormal; Notable for the following components:   D-Dimer, Quant 2.23 (*)    All other components within normal limits  LIPASE, BLOOD  HCG, SERUM, QUALITATIVE    EKG: EKG Interpretation Date/Time:  Saturday October 13 2024 00:46:55 EST Ventricular Rate:  91 PR Interval:  149 QRS Duration:  89 QT Interval:  370 QTC Calculation: 456 R Axis:   38  Text Interpretation: Sinus rhythm Borderline T abnormalities, inferior leads No significant change was found Confirmed by Trine Likes [phone]) on 10/13/2024 1:48:23 AM  Radiology: CT Angio Chest PE W and/or Wo Contrast Result Date: 10/13/2024 CLINICAL DATA:  Pain on inspiration. Positive D-dimer. Clinical concern for pulmonary embolus. EXAM: CT ANGIOGRAPHY CHEST WITH CONTRAST TECHNIQUE: Multidetector CT imaging of the chest was performed using the standard protocol during bolus administration of intravenous contrast. Multiplanar CT image  reconstructions and MIPs were obtained to evaluate the vascular anatomy. RADIATION DOSE REDUCTION: This exam was performed according to the departmental dose-optimization program which includes automated exposure control, adjustment of the mA and/or kV according to patient size and/or use of iterative reconstruction technique. CONTRAST:  75mL OMNIPAQUE  IOHEXOL  350 MG/ML SOLN COMPARISON:  10/29/2016 FINDINGS: Cardiovascular: The heart size is upper normal to borderline enlarged. No substantial pericardial effusion. No thoracic aortic aneurysm. No substantial atherosclerotic calcification of the thoracic aorta. Substantial image noise in the lung bases secondary to technique and bolus timing. While not definite, tiny nonocclusive subsegmental pulmonary embolus to the left lower lobe (image 204/10) and right lower lobe (210/10) is not excluded. Mediastinum/Nodes: Wispy soft tissue attenuation anterior  mediastinum is compatible with thymic remnant. No mediastinal lymphadenopathy. There is no hilar lymphadenopathy. The esophagus has normal imaging features. There is no axillary lymphadenopathy. Lungs/Pleura: The lungs are clear without focal pneumonia, edema, pneumothorax or pleural effusion. Streaky subsegmental atelectasis noted in the dependent lung bases. Upper Abdomen: Visualized portion of the upper abdomen shows no acute findings. Musculoskeletal: No worrisome lytic or sclerotic osseous abnormality. Review of the MIP images confirms the above findings. IMPRESSION: 1. Substantial image noise in the lung bases secondary to technique and bolus timing. While not definite, tiny nonocclusive subsegmental pulmonary embolus to the left lower lobe and right lower lobe is not excluded. 2. Otherwise no acute findings in the chest. Electronically Signed   By: Camellia Candle M.D.   On: 10/13/2024 09:26   US  Abdomen Limited RUQ (LIVER/GB) Result Date: 10/13/2024 CLINICAL DATA:  Right upper quadrant abdominal pain EXAM:  ULTRASOUND ABDOMEN LIMITED RIGHT UPPER QUADRANT COMPARISON:  10/26/2021 ultrasound exam.  10/11/2024 CT scan. FINDINGS: Gallbladder: Multiple gallstones evident measuring up to 12 mm. Review of yesterday's CT scan shows that these stones are not substantially calcified. No gallbladder wall thickening or pericholecystic fluid. Sonographer reports no sonographic Murphy sign. Common bile duct: Diameter: 3 mm Liver: No focal lesion identified. Within normal limits in parenchymal echogenicity. Portal vein is patent on color Doppler imaging with normal direction of blood flow towards the liver. Other: None. IMPRESSION: Cholelithiasis without gallbladder wall thickening or pericholecystic fluid. No biliary dilatation. Electronically Signed   By: Camellia Candle M.D.   On: 10/13/2024 07:28   DG Chest 2 View Result Date: 10/13/2024 CLINICAL DATA:  Right-sided chest pain. EXAM: CHEST - 2 VIEW COMPARISON:  02/28/2024 FINDINGS: The heart size and mediastinal contours are within normal limits. Both lungs are clear. The visualized skeletal structures are unremarkable. IMPRESSION: No active cardiopulmonary disease. Electronically Signed   By: Camellia Candle M.D.   On: 10/13/2024 07:27     Procedures   Medications Ordered in the ED  ketorolac  (TORADOL ) 15 MG/ML injection 15 mg (has no administration in time range)  morphine  (PF) 4 MG/ML injection 4 mg (4 mg Intravenous Given 10/13/24 0728)  ondansetron  (ZOFRAN ) injection 4 mg (4 mg Intravenous Given 10/13/24 0730)  iohexol  (OMNIPAQUE ) 350 MG/ML injection 75 mL (75 mLs Intravenous Contrast Given 10/13/24 0904)    Clinical Course as of 10/13/24 1034  Sat Oct 13, 2024  9162 Patient was reassessed.  She is feeling improved after treatment.  We discussed that her ultrasound shows gallstones but no sign of acute cholecystitis.  We discussed that her chest x-ray is reassuring.  Still waiting on D-dimer. [JB]  [id number] D-Dimer, Quant(!): 2.23 Dimer positive, will add CT angio to  rule out PE. [JB]    Clinical Course User Index [JB] Sathvik Tiedt, Warren SAILOR, PA-C                                 Medical Decision Making Amount and/or Complexity of Data Reviewed Labs: ordered. Decision-making details documented in ED Course. Radiology: ordered.  Risk Prescription drug management.   This patient presents to the ED for concern of abdominal pain, this involves an extensive number of treatment options, and is a complaint that carries with it a high risk of complications and morbidity.  The differential diagnosis includes cholecystitis, AAA, appendicitis, renal stone, UTI   Co morbidities that complicate the patient evaluation  Hx of appendectomy in 2015  Additional history obtained:  Additional history obtained from Pelvic US  and CT abd/pelvis done 2 days ago, showed gallstones, no other acute findings.  Tested positive for gonorrhea yesterday, but patient tells me she has received Rocephin  for this already.    Lab Tests:  I personally interpreted labs.  The pertinent results include:   CBC no leukocytosis, stable hemoglobin of 9.7 when compared to labs 3 days ago, CMP w/ normal LFT, normal Tbili, Lipase wnl, hCG negative  Dimer elevated     Imaging Studies ordered:  I ordered imaging studies including chest x-ray, RUQ US , CT angio I independently visualized and interpreted imaging which showed gall stones with no acute cholecystitis, no definitive PE. I agree with the radiologist interpretation   Cardiac Monitoring: / EKG:  The patient was maintained on a cardiac monitor.  I personally viewed and interpreted the cardiac monitored which showed an underlying rhythm of: sinus with non specific T wave changes.    Problem List / ED Course / Critical interventions / Medication management  Patient presents to emergency room with complaining of right upper quadrant pain that started 2 days ago.  She reports she has had some shortness of breath when she is taking  a deep breath in and pain. On arrival she is stable and well-appearing not tachycardic and no increased respiratory rate.  EKG shows sinus rhythm.  Her lab work is overall reassuring.  She is localizing her pain to right lower chest wall and right upper quadrant.  This is mildly reproducible on exam.  Will obtain right upper quadrant ultrasound, chest x-ray and added on D-dimer to evaluate for PE, but denies risk factor or history at this time.  Upper quadrant ultrasound shows cholelithiasis without any gallbladder wall thickening or pulmonary cholecystic fluid.  Suspect biliary colic is most likely cause of symptoms at this time. Elevated D-dimer.  CT angio chest PE rule out ordered.  CT angio does show no definitive pulmonary embolism however due to contrast timing I cannot completely rule out tiny nonocclusive subsegmental pulmonary embolism.  Otherwise no acute findings in the chest. Discussed all imaging and labs results with the patient. I did have shared decision making conversation with the patient on how to proceed, risks & benefits. We discussed the option of treating with anticoagulation specifically Eliquis. She ultimately would like to hold off on blood thinner medication at this time and follow-up with primary care for repeat assessment and possible repeat imaging due to no definitive clot on scan, suspect gallbladder as more likely etiology of pain at this time. I think this is reasonable as she is not tachycardic, not tachypneic no hypoxia or acute distress.  Discussed plan with attending prior to discharge who agrees with plan of share decision making and okay with discharge.  I ordered medication including morphine , zofran   Reevaluation of the patient after these medicines showed that the patient improved I have reviewed the patients home medicines and have made adjustments as needed. Tested positive for BV 2 days ago, requesting topical metronidazole  treatment, this medication was sent.   Stable well-appearing.  Seems appropriately medically screened for discharge and not requiring admission.  Given follow-up instructions and return precautions.      Final diagnoses:  Calculus of gallbladder without cholecystitis without obstruction  Chest pain, unspecified type  Bacterial vaginosis    ED Discharge Orders          Ordered    naproxen  (NAPROSYN ) 500 MG tablet  2 times daily  10/13/24 1006    metroNIDAZOLE  (METROGEL ) 0.75 % gel  Daily        10/13/24 1033               Kailand Seda, Warren SAILOR, PA-C 10/13/24 1034    Neysa Caron PARAS, DO 10/13/24 1452  "

## 2024-10-15 ENCOUNTER — Telehealth: Payer: Self-pay

## 2024-10-15 NOTE — Telephone Encounter (Signed)
 Copied from CRM #[id number]. Topic: Clinical - Prescription Issue >> Oct 15, 2024  4:13 PM Rea ORN wrote: Reason for CRM: Pt was diagnosed with BV via the ED on 1/7. On that day there was no rx sent and the pt went back to the ED on 1/9. The hospitalist ordered metroNIDAZOLE  (METROGEL ) 0.75% gel and there was no vaginal applicator. Pt is asking for PCP to order vaginal gel with applicator for her.   Please call back to advise,  [phone]

## 2024-10-15 NOTE — Telephone Encounter (Signed)
 Called pt and she reports she got shot at [facility] on 1/9 as she didn't want to wait for visit here. No longer needing treatment per pt

## 2024-10-17 NOTE — Telephone Encounter (Signed)
 She should be able to ask pharmacy for applicator without a new rx

## 2024-10-19 ENCOUNTER — Encounter: Payer: Self-pay | Admitting: Internal Medicine

## 2024-10-22 MED ORDER — METRONIDAZOLE 0.75 % VA GEL: 1.0000 | Freq: Two times a day (BID) | VAGINAL | 0 refills | Status: AC

## 2024-10-22 NOTE — Telephone Encounter (Signed)
 Sent in new rx

## 2024-10-22 NOTE — Addendum Note (Signed)
 Addended by: ROLLENE NORRIS A on: 10/22/2024 08:53 AM   Modules accepted: Orders

## 2024-10-25 ENCOUNTER — Ambulatory Visit: Payer: Self-pay | Admitting: General Surgery

## 2024-11-13 ENCOUNTER — Other Ambulatory Visit (HOSPITAL_COMMUNITY)

## 2024-11-14 ENCOUNTER — Encounter (HOSPITAL_COMMUNITY): Admission: RE | Payer: Self-pay | Source: Home / Self Care

## 2024-11-14 ENCOUNTER — Ambulatory Visit (HOSPITAL_COMMUNITY): Admission: RE | Admit: 2024-11-14 | Source: Home / Self Care | Admitting: General Surgery

## 2024-12-25 ENCOUNTER — Ambulatory Visit: Admitting: Physician Assistant
